# Patient Record
Sex: Male | Born: 1960 | Race: White | Hispanic: No | Marital: Single | Smoking: Former smoker
Health system: Southern US, Community
[De-identification: ages and names within clinical notes are randomized; demographics above are authoritative.]

## PROBLEM LIST (undated history)

## (undated) DIAGNOSIS — G1221 Amyotrophic lateral sclerosis: Secondary | ICD-10-CM

## (undated) DIAGNOSIS — K219 Gastro-esophageal reflux disease without esophagitis: Secondary | ICD-10-CM

## (undated) DIAGNOSIS — J302 Other seasonal allergic rhinitis: Secondary | ICD-10-CM

## (undated) DIAGNOSIS — K200 Eosinophilic esophagitis: Secondary | ICD-10-CM

## (undated) DIAGNOSIS — S42009A Fracture of unspecified part of unspecified clavicle, initial encounter for closed fracture: Secondary | ICD-10-CM

## (undated) HISTORY — DX: Gastro-esophageal reflux disease without esophagitis: K21.9

## (undated) HISTORY — DX: Eosinophilic esophagitis: K20.0

## (undated) HISTORY — DX: Fracture of unspecified part of unspecified clavicle, initial encounter for closed fracture: S42.009A

---

## 2006-11-27 DIAGNOSIS — K200 Eosinophilic esophagitis: Secondary | ICD-10-CM

## 2006-11-27 HISTORY — DX: Eosinophilic esophagitis: K20.0

## 2007-01-31 ENCOUNTER — Ambulatory Visit: Payer: Self-pay | Admitting: Internal Medicine

## 2007-01-31 ENCOUNTER — Emergency Department (HOSPITAL_COMMUNITY): Admission: EM | Admit: 2007-01-31 | Discharge: 2007-01-31 | Payer: Self-pay | Admitting: Emergency Medicine

## 2007-01-31 HISTORY — PX: ESOPHAGOGASTRODUODENOSCOPY: SHX1529

## 2007-02-14 ENCOUNTER — Ambulatory Visit (HOSPITAL_COMMUNITY): Admission: RE | Admit: 2007-02-14 | Discharge: 2007-02-14 | Payer: Self-pay | Admitting: Internal Medicine

## 2007-02-14 ENCOUNTER — Encounter (INDEPENDENT_AMBULATORY_CARE_PROVIDER_SITE_OTHER): Payer: Self-pay | Admitting: *Deleted

## 2007-02-14 ENCOUNTER — Ambulatory Visit: Payer: Self-pay | Admitting: Internal Medicine

## 2007-02-14 HISTORY — PX: ESOPHAGOGASTRODUODENOSCOPY: SHX1529

## 2007-03-27 ENCOUNTER — Ambulatory Visit: Payer: Self-pay | Admitting: Internal Medicine

## 2007-09-27 ENCOUNTER — Ambulatory Visit: Payer: Self-pay | Admitting: Internal Medicine

## 2008-08-19 ENCOUNTER — Ambulatory Visit: Payer: Self-pay | Admitting: Internal Medicine

## 2010-04-14 ENCOUNTER — Encounter (INDEPENDENT_AMBULATORY_CARE_PROVIDER_SITE_OTHER): Payer: Self-pay | Admitting: *Deleted

## 2010-12-27 NOTE — Letter (Signed)
Summary: Unable to Reach, Consult Scheduled  [facility]  [address]   [place], [place] [address]   Phone: [phone]  Fax: [phone]    04/14/2010  [name] [address] [place], [place]  [address] 09/08/1961   Dear [name],   We have been unable to reach you by phone.   At the recommendation of Dr Janna Arch  we have been asked to schedule you a consult with Dr Jena Gauss for abdominal pain,IBS symptoms,dysphagia and consult for a colonoscopy.   Please call our office at [phone].     Thank you,    Diana Eves  [facility] R. Roetta Sessions, M.D.    Jonette Eva, M.D. Lorenza Burton, FNP-BC    Tana Coast, PA-C Phone: [phone]    Fax: [phone]

## 2011-04-11 NOTE — Assessment & Plan Note (Signed)
[name], [name]                  CHART#:  [id number]   DATE:  09/27/2007                       DOB:  10-07-1961   Follow-up eosinophilic esophagitis Schatzki's ring last seen 03/27/07.  He has done overall fairly well since having his esophagus dilated and  going on a course of oral steroid therapy (could not afford Flovent).  He is taking Prilosec 20 mg orally daily.  He is a little bit scared to  swallow from time to time when he chews his food he does fairly.  He  chews his food thoroughly, takes his time.  Has liquids on hand for  swallowing. He ends up getting along fairly well with this.  He has  chosen not to see the allergist thus far although I recommended this  approach and he has the contact information.   CURRENT MEDICATIONS:  Prilosec 20 mg orally daily.   ALLERGIES:  No known drug allergies.   PHYSICAL EXAMINATION:  GENERAL:  He looks well.  VITALS:  Weight 160.  Height 5 feet 11 inches, temperature 98, blood  pressure 116/80, pulse 84.  Detailed exam deferred.   ASSESSMENT:  The eosinophilic esophagitis/Schatzki's ring status post  dilation status post systemic corticosteroid therapy with significant  improvement.   RECOMMENDATIONS:  Continue Prilosec 20 mg orally daily.   PLAN:  See him back in one year.  Should he experience significant  recurrent dysphagia symptoms we can readdress topical steroid therapy  and/or repeat general esophageal dilation.  I have recommended the  evaluation and it is believed to be helpful given our current state of  knowledge regarding this disorder and anecdotally all 3 patients I have  had previously diagnosed with eosinophilic esophagitis have had  significant benefit from seeing the allergist.  He has contact  information and it is up to him.       Jonathon Bellows, M.D.  Electronically Signed     RMR/MEDQ  D:  09/27/2007  T:  09/27/2007  Job:  [id number]

## 2011-04-11 NOTE — Assessment & Plan Note (Signed)
NAMERYNE, MCTIGUE                  CHART#:  [id number]   DATE:  08/19/2008                       DOB:  [birth year]   CHIEF COMPLAINT:  Followup of problem with swallowing.   SUBJECTIVE:  The patient is here for a followup visit.  He was last seen  on 09/27/2007.  He has a history of eosinophilic esophagitis, Schatzki  ring.  He underwent an emergent EGD in March 2008 for food impaction.  At time of repeat EGD, he was found to have a red-appearing esophageal  mucosa with a prominent Schatzki ring.  He had a moderate-sized hiatal  hernia.  He had minimal bulbar erosions.  The esophagus was dilated with  a 54-French Maloney dilator.  Biopsies were consistent with the  eosinophilic esophagitis.  He cannot afford Flonase.  He was treated  with a course of prednisone.  At the last time he was seen, he was felt  to be doing well.  He has been encouraged to go and see Dr. Lucie Leather, an  allergist in [place], but he still has not done this.  He states he  really has not had any problems with swallowing food like he did before,  but he just does not feel like he is swallowing, in fact, 100%.  When he  eats, he feels a heaviness in his chest.  He also notices it at  nighttime when he is lying down.  When he gets up in the morning, he has  heaviness in his chest reveals congestion.  He denies any typical  heartburn symptoms.  He denies any nausea, vomiting, or abdominal pain.  Bowel movements are regular.  He takes Prilosec 20 mg daily.   MEDICATIONS:  See updated list.   ALLERGIES:  No known drug allergies.   PHYSICAL EXAMINATION:  VITAL SIGNS:  Weight 166 up 6 pounds, temp 98.2,  blood pressure 120/82, and pulse 60.  GENERAL:  He looks well.  SKIN:  Warm and dry and no jaundice.  Detailed exam deferred.   IMPRESSION:  History of eosinophilic esophagitis/Schatzki ring, status  post dilation and systemic corticosteroid therapy as above.  The patient  feels like he is having some recurrence  of early symptoms.  Given that  he is having a lot of nocturnal and early morning symptoms, he may just  simply be refluxing.  He does note that he eats very late at night and  usually eats while he is in bed.  He is quite anxious and worried about  getting to the point as it was before with food impaction.  He believes  that he may go and see the allergist now.  He would like to think about  a little bit further.  I did offer him empirical trial with prednisone  therapy to see if he responds.  If not, he may need repeat  esophagogastroduodenoscopy.   PLAN:  The patient will call us when he decides which approach he wants  to go with.  I told him that if he does see the allergist in the near  future, we would like to postpone prednisone therapy  as I am not sure it would affect their testing.  Otherwise, we will go  ahead and empirically treat him for recurrent eosinophilic esophagitis  and go from there.  Tana Coast, P.A.  Electronically Signed     R. Roetta Sessions, M.D.  Electronically Signed    LL/MEDQ  D:  08/19/2008  T:  08/20/2008  Job:  [id number]

## 2011-04-14 NOTE — Op Note (Signed)
NAMESANDRO, BURGO                 ACCOUNT NO.:  [id number]   MEDICAL RECORD NO.:  [id number]          PATIENT TYPE:  EMS   LOCATION:  ED                            FACILITY:  [facility]   PHYSICIAN:  R. Roetta Sessions, M.D. DATE OF BIRTH:  1961/09/06   DATE OF PROCEDURE:  01/31/2007  DATE OF DISCHARGE:                               OPERATIVE REPORT   PROCEDURE:  Emergency esophagogastroduodenoscopy with esophageal food  bolus removal.   INDICATIONS FOR PROCEDURE:  A 50 year old gentleman with longstanding  GERD and chronic intermittent esophageal dysphagia.  Had a steak for  supper tonight at work.  Ate one bite and felt that it lodged behind his  breast bone.  He has not been able to swallow even saliva.  Since that  time, he came to the emergency room and saw Dr. Melvyn Neth, who gave him some  Glucagon and Ativan.  That did not help.  Dr. Melvyn Neth called me, and I  came to see him.   HISTORY:  Pretty much as above.  Longstanding reflux symptoms.  Not on  any acid-suppression therapy.  Has not seen a doctor in years.  He  denies chronic illnesses, any family history of GI malignancies or prior  GI surgery.  He has a partial upper plate.   He does not use alcohol, tobacco, or NSAIDs.  No history of illicit drug  use.  EGD is now being done as an emergency.  I discussed with Mr. Gwinner  that our goal tonight is to disimpact his esophagus.  He likely has a  ring and/or stricture or other process, which would not be dilated,  given their high likelihood of food in the upper GI tract.  His  questions were answered.  He is agreeable.   PROCEDURE NOTE:  O2 saturation, blood pressure, pulses, and respirations  were monitored throughout the entire procedure.  Conscious sedation with  Versed 1 mg IV and Demerol 25 mg IV in divided doses.  Cetacaine gargled  and expectorated for topical oropharyngeal anesthesia.   INSTRUMENT:  Pentax video chip system.   FINDINGS:  Examination of the tubular  esophagus revealed a fluid column  in the distal esophagus.  It was suctioned.  There was a food bolus ball-  valving.  I approximated the scope against the food bolus and gently  pushed.  It popped into the stomach, relieving the impaction.  The  distal esophageal mucosa was quite macerated, and there was a prominent  Schatzki's ring.  Given there was food in the stomach, I elected not to  pursue further evaluation this evening.  The patient tolerated the  procedure well and was reactive.   ENDOSCOPY IMPRESSION:  1. Esophageal food impaction, as described above.  Status post      disimpaction, as described above.  2. Schatzki's ring, inflamed distal esophageal mucosa.  Complete      examination not carried out.   RECOMMENDATIONS:  1. Full-liquid diet until esophageal dilation can be performed.  2. Prevacid Solu-Tabs 30 mg once daily.  3. My office will call Mr. Mahler to set  up an elective dilation in the      next 10 days to two weeks.      Jonathon Bellows, M.D.  Electronically Signed     RMR/MEDQ  D:  01/31/2007  T:  02/01/2007  Job:  [id number]   cc:   Katheren Shams. Melvyn Neth, MD  Fax: [phone]

## 2011-04-14 NOTE — Op Note (Signed)
NAMEHILLERY, BHALLA                 ACCOUNT NO.:  [id number]   MEDICAL RECORD NO.:  [id number]          PATIENT TYPE:  AMB   LOCATION:  DAY                           FACILITY:  [facility]   PHYSICIAN:  R. Roetta Sessions, M.D. DATE OF BIRTH:  August 25, 1961   DATE OF PROCEDURE:  02/14/2007  DATE OF DISCHARGE:                               OPERATIVE REPORT   PROCEDURE:  Esophagogastroduodenoscopy with Elease Hashimoto dilation followed by  biopsy.   INDICATIONS FOR PROCEDURE:  The patient is a 50 year old gentleman whom  I first saw emergently on 01/31/07 when he presented to the Emergency  Department with a food impaction.  He underwent emergency disimpaction.  He was felt to have a Schatzki's ring at that time.  He has been on  Prilosec 20 mg OTC ever since.  He comes now for elective EGD with  esophageal dilation.  This procedure has been discussed with the patient  at length.  Potential risks, benefits and alternatives have been  reviewed.  Questions are answered.  Please see documentation in medical  records.   PROCEDURE NOTE:  O2 saturation, blood pressure, pulse and respirations  were monitored throughout the entire procedure.  Conscious sedation with  IV Versed and Demerol in incremental doses. Cetacaine spray for  hypopharyngeal anesthesia.   INSTRUMENT:  Pentax video chip system.   FINDINGS:  Examination of the tubular esophagus revealed a prominent  Schatzki's ring also.  There was a ribbed or somewhat trachealized  appearance to the esophageal mucosa.  Please see photos.  Somewhat  suspicious for eosinophilic esophagitis.  The esophageal lumen however  was widely patent except at the level of the ring.  The EG junction was  easily traversed.  The stomach, colon and gastric cavity were emptied,  insufflated well with air.  A thorough examination of the gastric mucosa  including retroflexion in the proximal stomach, esophagus, gastric  junction demonstrated only a small hiatal hernia.  The  pylorus was  patent and easily traversed.  Examination of the bulb and second portion  revealed no abnormalities.   THERAPY/DIAGNOSTIC MANEUVERS PERFORMED:  The scope was withdrawn.  I  felt at least a 54 [place] Maloney dilator was needed to disrupt the  ring.  This was indeed passed fully with ease.  I looked back.  It  revealed the ring had been disrupted and there were two 2 cm mucosal  tears and the distal esophagus appeared to be superficial.  This  relatively modest size dilator for the size of this patient's esophagus  produced a somewhat surprising disruption in the mucosa which heightens  m suspicious for eosinophilic esophagitis.  Subsequently, biopsies of  the mid esophagus were taken for histologic study.  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  One of those were biopsied separately.  The patient  tolerated the procedure well and reactive after endoscopy.   IMPRESSION:  Somewhat ribbed appearing esophageal mucosa, prominent  Schatzki's ring.  Pale raised esophageal nodule, status post dilation of  Schatzki's ring with [facility] dilators  status post biopsy esophageal  mucosa to rule out eosinophilic esophagitis.  Biopsy of pale raised  nodule done separately.  Moderate sized hiatal hernia, otherwise normal  stomach.  Patent pylorus.  Not mentioned above, the patient was felt to  have minimal bulbar erosions, otherwise D1, D2 appeared normal.   RECOMMENDATIONS:  1. Continue Prilosec 20 mg orally daily.  2. Add Carafate one gram slurry q.i.d. to this regimen x3 days.  3. Clear liquid diet, advance to a soft diet and then as tolerated,      beginning 02/15/07.  4. Further recommendations to follow pending review of path.      Jonathon Bellows, M.D.  Electronically Signed     RMR/MEDQ  D:  02/14/2007  T:  02/14/2007  Job:  [id number]   cc:   Ulis Rias  Fax: [phone]

## 2013-04-09 ENCOUNTER — Encounter: Payer: Self-pay | Admitting: Internal Medicine

## 2013-04-10 ENCOUNTER — Ambulatory Visit (INDEPENDENT_AMBULATORY_CARE_PROVIDER_SITE_OTHER): Payer: BC Managed Care – PPO | Admitting: Gastroenterology

## 2013-04-10 ENCOUNTER — Encounter: Payer: Self-pay | Admitting: Gastroenterology

## 2013-04-10 VITALS — BP 130/87 | HR 56 | Temp 97.4°F | Ht 68.0 in | Wt 186.0 lb

## 2013-04-10 DIAGNOSIS — R1310 Dysphagia, unspecified: Secondary | ICD-10-CM | POA: Insufficient documentation

## 2013-04-10 DIAGNOSIS — K219 Gastro-esophageal reflux disease without esophagitis: Secondary | ICD-10-CM

## 2013-04-10 DIAGNOSIS — R1314 Dysphagia, pharyngoesophageal phase: Secondary | ICD-10-CM

## 2013-04-10 DIAGNOSIS — K200 Eosinophilic esophagitis: Secondary | ICD-10-CM

## 2013-04-10 NOTE — Patient Instructions (Addendum)
1. We have scheduled you for an upper endoscopy with dilation of your esophagus by Dr. Jena Gauss. Please see separate instructions. 2. Please continue Prilosec daily.

## 2013-04-10 NOTE — Assessment & Plan Note (Signed)
52 year old gentleman with recurrent solid food esophageal dilation with history of food impaction in 2008. He had esophageal dilation on a subsequent EGD at that time. Biopsies confirmed eosinophilic esophagitis which was treated with systemic corticosteroids. Patient failed to follow up for food allergy testing. Suspect flare of eosinophilic esophagitis with stricture. Recommend EGD with dilation in the near future.  I have discussed the risks, alternatives, benefits with regards to but not limited to the risk of reaction to medication, bleeding, infection, perforation and the patient is agreeable to proceed. Written consent to be obtained.  Continue omeprazole 20mg  daily. Soft mechanical diet at this time.  As an aside, vague abdominal pain unrelated to meals/BMs. EGD as planned. Offered colonoscopy, but patient not interested at this time.

## 2013-04-10 NOTE — Progress Notes (Signed)
Cc PCP 

## 2013-04-10 NOTE — Progress Notes (Signed)
Primary Care Physician:  DONDIEGO,RICHARD M, MD  Primary Gastroenterologist:  Michael Rourk, MD   Chief Complaint  Patient presents with  . Dysphagia    HPI:  [name] is a 52 y.o. male here for complaints of recurrent dysphagia.  He has a history of eosinophilic esophagitis and 2008. At that time he was treated with systemic corticosteroid therapy as he could not afford Flovent at that time. We referred him to an allergist but he did not keep the appointment. When he initially presented he had a food impaction and had come back for later dilation. See below for details. Last saw him in 2009. There the course of last several months he has noted recurrent issue swallowing. Sometimes he does okay however. He has had several episodes where he feels the food is slow going down but no food impaction at this point. Denies typical heartburn. He has been on omeprazole 20 mg daily. Steak and meats difficulty to swallow. Upper abdominal discomfort, off and on for about one year ago. Wakes up at times with vague pain. Nothing excruciating. Pain is unrelated to meals or bowel movement. No prior colonoscopy. No melena, brbpr.    Current Outpatient Prescriptions  Medication Sig Dispense Refill  . Omeprazole Magnesium (PRILOSEC OTC PO) Take 20 mg by mouth daily.      . sodium chloride (OCEAN) 0.65 % nasal spray Place 1 spray into the nose as needed for congestion.       No current facility-administered medications for this visit.    Allergies as of 04/10/2013  . (No Known Allergies)    Past Medical History  Diagnosis Date  . Eosinophilic esophagitis 2008    Previously treated with systemic corticosteroid therapy as the patient could not afford Flovent at that time. Patient was referred to the allergist but did not go to the appointment.  . GERD (gastroesophageal reflux disease)     Past Surgical History  Procedure Laterality Date  . Esophagogastroduodenoscopy  01/31/2007    RMR:Esophageal  food impaction/ Schatzki's ring, inflamed distal esophageal mucosa  . Esophagogastroduodenoscopy  02/14/2007    RMR: Somewhat ribbed appearing esophageal mucosa (biopsy-eosinophilic esophagitis), prominent Schatzki ring status post dilation with Maloney dilators.  Pale raised esophageal nodule status post biopsy (eosinophilic esophagitis). Moderate sized hiatal hernia.     Family History  Problem Relation Age of Onset  . Colon cancer Neg Hx   . Liver disease Neg Hx     History   Social History  . Marital Status: Single    Spouse Name: N/A    Number of Children: N/A  . Years of Education: N/A   Occupational History  . Not on file.   Social History Main Topics  . Smoking status: Never Smoker   . Smokeless tobacco: Not on file  . Alcohol Use: No  . Drug Use: No  . Sexually Active: Not on file   Other Topics Concern  . Not on file   Social History Narrative  . No narrative on file      ROS:  General: Negative for anorexia, weight loss, fever, chills, fatigue, weakness. Eyes: Negative for vision changes.  ENT: Negative for hoarseness, difficulty swallowing , nasal congestion. Complains of rhinorrhea CV: Negative for chest pain, angina, palpitations, dyspnea on exertion, peripheral edema.  Respiratory: Negative for dyspnea at rest, dyspnea on exertion, cough, sputum, wheezing.  GI: See history of present illness. GU:  Negative for dysuria, hematuria, urinary incontinence, urinary frequency, nocturnal urination.  MS: Negative   for joint pain, low back pain.  Derm: Negative for rash or itching.  Neuro: Negative for weakness, abnormal sensation, seizure, frequent headaches, memory loss, confusion.  Psych: Negative for anxiety, depression, suicidal ideation, hallucinations.  Endo: Negative for unusual weight change.  Heme: Negative for bruising or bleeding. Allergy: Negative for rash or hives.    Physical Examination:  BP 130/87  Pulse 56  Temp(Src) 97.4 F (36.3 C)  (Oral)  Ht 5' 8" (1.727 m)  Wt 186 lb (84.369 kg)  BMI 28.29 kg/m2   General: Well-nourished, well-developed in no acute distress. Anxious Head: Normocephalic, atraumatic.   Eyes: Conjunctiva pink, no icterus. Mouth: Oropharyngeal mucosa moist and pink , no lesions erythema or exudate. Neck: Supple without thyromegaly, masses, or lymphadenopathy.  Lungs: Clear to auscultation bilaterally.  Heart: Regular rate and rhythm, no murmurs rubs or gallops.  Abdomen: Bowel sounds are normal, nontender, nondistended, no hepatosplenomegaly or masses, no abdominal bruits or    hernia , no rebound or guarding.   Rectal: Not performed Extremities: No lower extremity edema. No clubbing or deformities.  Neuro: Alert and oriented x 4 , grossly normal neurologically.  Skin: Warm and dry, no rash or jaundice.   Psych: Alert and cooperative, normal mood and affect.   

## 2013-04-25 ENCOUNTER — Encounter (HOSPITAL_COMMUNITY)
Admission: RE | Disposition: A | Discharge status: Home / Self Care | Payer: Self-pay | Source: Ambulatory Visit | Attending: Internal Medicine

## 2013-04-25 ENCOUNTER — Encounter (HOSPITAL_COMMUNITY): Payer: Self-pay | Admitting: *Deleted

## 2013-04-25 ENCOUNTER — Ambulatory Visit (HOSPITAL_COMMUNITY)
Admission: RE | Admit: 2013-04-25 | Discharge: 2013-04-25 | Disposition: A | Discharge status: Home / Self Care | Payer: BC Managed Care – PPO | Source: Ambulatory Visit | Attending: Internal Medicine | Admitting: Internal Medicine

## 2013-04-25 DIAGNOSIS — K2960 Other gastritis without bleeding: Secondary | ICD-10-CM

## 2013-04-25 DIAGNOSIS — K219 Gastro-esophageal reflux disease without esophagitis: Secondary | ICD-10-CM

## 2013-04-25 DIAGNOSIS — K200 Eosinophilic esophagitis: Secondary | ICD-10-CM | POA: Insufficient documentation

## 2013-04-25 DIAGNOSIS — K210 Gastro-esophageal reflux disease with esophagitis: Secondary | ICD-10-CM

## 2013-04-25 DIAGNOSIS — R1310 Dysphagia, unspecified: Secondary | ICD-10-CM | POA: Insufficient documentation

## 2013-04-25 HISTORY — PX: ESOPHAGOGASTRODUODENOSCOPY (EGD) WITH ESOPHAGEAL DILATION: SHX5812

## 2013-04-25 HISTORY — DX: Other seasonal allergic rhinitis: J30.2

## 2013-04-25 SURGERY — ESOPHAGOGASTRODUODENOSCOPY (EGD) WITH ESOPHAGEAL DILATION
Anesthesia: Moderate Sedation

## 2013-04-25 MED ORDER — MIDAZOLAM HCL 5 MG/5ML IJ SOLN
INTRAMUSCULAR | Status: AC
  Filled 2013-04-25: qty 10

## 2013-04-25 MED ORDER — ONDANSETRON HCL 4 MG/2ML IJ SOLN
INTRAMUSCULAR | Status: DC | PRN
  Administered 2013-04-25: 4 mg via INTRAVENOUS

## 2013-04-25 MED ORDER — MIDAZOLAM HCL 5 MG/5ML IJ SOLN
INTRAMUSCULAR | Status: DC | PRN
  Administered 2013-04-25: 1 mg via INTRAVENOUS
  Administered 2013-04-25 (×2): 2 mg via INTRAVENOUS
  Administered 2013-04-25: 1 mg via INTRAVENOUS

## 2013-04-25 MED ORDER — MEPERIDINE HCL 100 MG/ML IJ SOLN
INTRAMUSCULAR | Status: DC | PRN
  Administered 2013-04-25: 25 mg via INTRAVENOUS
  Administered 2013-04-25 (×2): 50 mg via INTRAVENOUS

## 2013-04-25 MED ORDER — BUTAMBEN-TETRACAINE-BENZOCAINE 2-2-14 % EX AERO
INHALATION_SPRAY | CUTANEOUS | Status: DC | PRN
  Administered 2013-04-25: 2 via TOPICAL

## 2013-04-25 MED ORDER — ONDANSETRON HCL 4 MG/2ML IJ SOLN
INTRAMUSCULAR | Status: AC
  Filled 2013-04-25: qty 2

## 2013-04-25 MED ORDER — STERILE WATER FOR IRRIGATION IR SOLN
Status: DC | PRN
  Administered 2013-04-25: 08:00:00

## 2013-04-25 MED ORDER — SODIUM CHLORIDE 0.9 % IV SOLN
INTRAVENOUS | Status: DC
  Administered 2013-04-25: 1000 mL via INTRAVENOUS

## 2013-04-25 MED ORDER — MEPERIDINE HCL 100 MG/ML IJ SOLN
INTRAMUSCULAR | Status: AC
  Filled 2013-04-25: qty 2

## 2013-04-25 NOTE — H&P (View-Only) (Signed)
Cc PCP 

## 2013-04-25 NOTE — Op Note (Signed)
[facility] [address] [place], [address]   ENDOSCOPY PROCEDURE REPORT  PATIENT: [name], [name]  MR#: [id number] BIRTHDATE: 1961/01/25 , 52  yrs. old GENDER: Male ENDOSCOPIST: R.  Roetta Sessions, MD FACP FACG REFERRED BY:  Oval Linsey, M.D. PROCEDURE DATE:  04/25/2013 PROCEDURE:     EGD with Elease Hashimoto dilation followed by esophageal biopsy  INDICATIONS:     Recurrent esophageal dysphagia; history of GERD; history of eosinophilic esophagitis  INFORMED CONSENT:   The risks, benefits, limitations, alternatives and imponderables have been discussed.  The potential for biopsy, esophogeal dilation, etc. have also been reviewed.  Questions have been answered.  All parties agreeable.  Please see the history and physical in the medical record for more information.  MEDICATIONS: Versed 6 mg IV and Demerol 125 mg IV in divided doses. Zofran 4 mg IV. Cetacaine spray.  DESCRIPTION OF PROCEDURE:   The EG-[id number] ([id number])  endoscope was introduced through the mouth and advanced to the second portion of the duodenum without difficulty or limitations.  The mucosal surfaces were surveyed very carefully during advancement of the scope and upon withdrawal.  Retroflexion view of the proximal stomach and esophagogastric junction was performed.      FINDINGS:   Patent appearing tubular esophagus patient had a 8 mm distal esophageal erosion Spratley the EG junction. No Barrett's esophagus. Patient did have a subtle "ringed" esophageal body. Stomach empty. Patient couple of antral erosions. Small hiatal hernia. Patent pylorus. Normal first and second portion of the duodenum  THERAPEUTIC / DIAGNOSTIC MANEUVERS PERFORMED:  A 52 French Maloney dilators passed to full insertion easily. A look back revealed a 5 mm superficial tear through the UES. Just above the GE junction is a 1.5 cm superficial appearing tear. Subsequently, biopsies of the mid and distal esophagus were  taken for histologic study.   COMPLICATIONS:  None  IMPRESSION:    Erosive reflux esophagitis. Subtle changes suggestive of eosinophilic esophagitis. Status post conservative Maloney dilation followed by esophageal biopsy as described above. Hiatal hernia. Antral erosions of doubtful clinical significance  RECOMMENDATIONS:    Swallowing precautions recommended. Increase omeprazole to 20 mg orally twice daily. Will make further recommendations regarding management once pathology report is available for review.    _______________________________ R. Roetta Sessions, MD FACP [facility] eSigned:  R. Roetta Sessions, MD FACP [facility] 04/25/2013 8:12 AM     CC:  PATIENT NAME:  Andreas, Sobolewski MR#: [id number]

## 2013-04-25 NOTE — H&P (View-Only) (Signed)
Primary Care Physician:  Isabella Stalling, MD  Primary Gastroenterologist:  Roetta Sessions, MD   Chief Complaint  Patient presents with  . Dysphagia    HPI:  [name] is a 52 y.o. male here for complaints of recurrent dysphagia.  He has a history of eosinophilic esophagitis and 2008. At that time he was treated with systemic corticosteroid therapy as he could not afford Flovent at that time. We referred him to an allergist but he did not keep the appointment. When he initially presented he had a food impaction and had come back for later dilation. See below for details. Last saw him in 2009. There the course of last several months he has noted recurrent issue swallowing. Sometimes he does okay however. He has had several episodes where he feels the food is slow going down but no food impaction at this point. Denies typical heartburn. He has been on omeprazole 20 mg daily. Steak and meats difficulty to swallow. Upper abdominal discomfort, off and on for about one year ago. Wakes up at times with vague pain. Nothing excruciating. Pain is unrelated to meals or bowel movement. No prior colonoscopy. No melena, brbpr.    Current Outpatient Prescriptions  Medication Sig Dispense Refill  . Omeprazole Magnesium (PRILOSEC OTC PO) Take 20 mg by mouth daily.      . sodium chloride (OCEAN) 0.65 % nasal spray Place 1 spray into the nose as needed for congestion.       No current facility-administered medications for this visit.    Allergies as of 04/10/2013  . (No Known Allergies)    Past Medical History  Diagnosis Date  . Eosinophilic esophagitis 2008    Previously treated with systemic corticosteroid therapy as the patient could not afford Flovent at that time. Patient was referred to the allergist but did not go to the appointment.  Marland Kitchen GERD (gastroesophageal reflux disease)     Past Surgical History  Procedure Laterality Date  . Esophagogastroduodenoscopy  01/31/2007    [id number]  food impaction/ Schatzki's ring, inflamed distal esophageal mucosa  . Esophagogastroduodenoscopy  02/14/2007    RMR: Somewhat ribbed appearing esophageal mucosa (biopsy-eosinophilic esophagitis), prominent Schatzki ring status post dilation with Maloney dilators.  Pale raised esophageal nodule status post biopsy (eosinophilic esophagitis). Moderate sized hiatal hernia.     Family History  Problem Relation Age of Onset  . Colon cancer Neg Hx   . Liver disease Neg Hx     History   Social History  . Marital Status: Single    Spouse Name: N/A    Number of Children: N/A  . Years of Education: N/A   Occupational History  . Not on file.   Social History Main Topics  . Smoking status: Never Smoker   . Smokeless tobacco: Not on file  . Alcohol Use: No  . Drug Use: No  . Sexually Active: Not on file   Other Topics Concern  . Not on file   Social History Narrative  . No narrative on file      ROS:  General: Negative for anorexia, weight loss, fever, chills, fatigue, weakness. Eyes: Negative for vision changes.  ENT: Negative for hoarseness, difficulty swallowing , nasal congestion. Complains of rhinorrhea CV: Negative for chest pain, angina, palpitations, dyspnea on exertion, peripheral edema.  Respiratory: Negative for dyspnea at rest, dyspnea on exertion, cough, sputum, wheezing.  GI: See history of present illness. GU:  Negative for dysuria, hematuria, urinary incontinence, urinary frequency, nocturnal urination.  MS: Negative  for joint pain, low back pain.  Derm: Negative for rash or itching.  Neuro: Negative for weakness, abnormal sensation, seizure, frequent headaches, memory loss, confusion.  Psych: Negative for anxiety, depression, suicidal ideation, hallucinations.  Endo: Negative for unusual weight change.  Heme: Negative for bruising or bleeding. Allergy: Negative for rash or hives.    Physical Examination:  BP 130/87  Pulse 56  Temp(Src) 97.4 F (36.3 C)  (Oral)  Ht 5\' 8"  (1.727 m)  Wt 186 lb (84.369 kg)  BMI 28.29 kg/m2   General: Well-nourished, well-developed in no acute distress. Anxious Head: Normocephalic, atraumatic.   Eyes: Conjunctiva pink, no icterus. Mouth: Oropharyngeal mucosa moist and pink , no lesions erythema or exudate. Neck: Supple without thyromegaly, masses, or lymphadenopathy.  Lungs: Clear to auscultation bilaterally.  Heart: Regular rate and rhythm, no murmurs rubs or gallops.  Abdomen: Bowel sounds are normal, nontender, nondistended, no hepatosplenomegaly or masses, no abdominal bruits or    hernia , no rebound or guarding.   Rectal: Not performed Extremities: No lower extremity edema. No clubbing or deformities.  Neuro: Alert and oriented x 4 , grossly normal neurologically.  Skin: Warm and dry, no rash or jaundice.   Psych: Alert and cooperative, normal mood and affect.

## 2013-04-25 NOTE — Interval H&P Note (Signed)
History and Physical Interval Note:  04/25/2013 7:34 AM  [name]  has presented today for surgery, with the diagnosis of Eosinophilic esophagitis, GERD, Esophageal Dysphagia  The various methods of treatment have been discussed with the patient and family. After consideration of risks, benefits and other options for treatment, the patient has consented to  Procedure(s) with comments: ESOPHAGOGASTRODUODENOSCOPY (EGD) WITH ESOPHAGEAL DILATION (N/A) - 7:30 as a surgical intervention .  The patient's history has been reviewed, patient examined, no change in status, stable for surgery.  I have reviewed the patient's chart and labs.  Questions were answered to the patient's satisfaction.     [name]  EGD with potential esophageal dilation as planned.The risks, benefits, limitations, alternatives and imponderables have been reviewed with the patient. Potential for esophageal dilation, biopsy, etc. have also been reviewed.  Questions have been answered. All parties agreeable.

## 2013-04-25 NOTE — Interval H&P Note (Signed)
Patient without change. EGD with possible soft with dilation per plan.   History and Physical Interval Note:  04/25/2013 7:41 AM  [name]  has presented today for surgery, with the diagnosis of Eosinophilic esophagitis, GERD, Esophageal Dysphagia  The various methods of treatment have been discussed with the patient and family. After consideration of risks, benefits and other options for treatment, the patient has consented to  Procedure(s) with comments: ESOPHAGOGASTRODUODENOSCOPY (EGD) WITH ESOPHAGEAL DILATION (N/A) - 7:30 as a surgical intervention .  The patient's history has been reviewed, patient examined, no change in status, stable for surgery.  I have reviewed the patient's chart and labs.  Questions were answered to the patient's satisfaction.     [name]

## 2013-04-28 ENCOUNTER — Encounter (HOSPITAL_COMMUNITY): Payer: Self-pay | Admitting: Internal Medicine

## 2013-04-29 ENCOUNTER — Encounter: Payer: Self-pay | Admitting: Internal Medicine

## 2013-05-02 ENCOUNTER — Encounter: Payer: Self-pay | Admitting: Internal Medicine

## 2013-05-22 ENCOUNTER — Encounter: Payer: Self-pay | Admitting: Gastroenterology

## 2013-05-22 ENCOUNTER — Ambulatory Visit (INDEPENDENT_AMBULATORY_CARE_PROVIDER_SITE_OTHER): Payer: BC Managed Care – PPO | Admitting: Gastroenterology

## 2013-05-22 VITALS — BP 127/75 | HR 63 | Temp 98.2°F | Ht 72.0 in | Wt 184.0 lb

## 2013-05-22 DIAGNOSIS — K200 Eosinophilic esophagitis: Secondary | ICD-10-CM

## 2013-05-22 DIAGNOSIS — R1314 Dysphagia, pharyngoesophageal phase: Secondary | ICD-10-CM

## 2013-05-22 DIAGNOSIS — R1310 Dysphagia, unspecified: Secondary | ICD-10-CM

## 2013-05-22 MED ORDER — FLUTICASONE PROPIONATE HFA 220 MCG/ACT IN AERO: INHALATION_SPRAY | RESPIRATORY_TRACT | Status: DC

## 2013-05-22 NOTE — Patient Instructions (Addendum)
1. Flovent, 2 sprays in mouth (swallow, do not inhale) twice a day for 8 weeks. Do not eat or drink for 30 minutes after taking medication. 2. We will check into allergy testing for you. 3. Continue Prilosec daily as before. 4. After taking Flovent, if you have recurrent swallowing difficulties or your symptoms do not continue to improve, please let us know as you may need a higher dose or longer duration of therapy.  I have tried numerous of times to get Mr. Commons referred to [facility] in [place] for Allergy Testing and he will not return my calls.

## 2013-05-22 NOTE — Progress Notes (Signed)
Primary Care Physician: Isabella Stalling, MD  Primary Gastroenterologist:  Roetta Sessions, MD   Chief Complaint  Patient presents with  . Follow-up    HPI: [name] is a 52 y.o. male here for f/u of EGD with dilation, eosinophilic esophagitis on 04/25/13. Some improvement in swallowing but still some minor issues with solid foods at times. No heartburn. Denies abdominal pain, N/V, constipation, diarrhea, melena, brbpr. No prior colonoscopy.   Current Outpatient Prescriptions  Medication Sig Dispense Refill  . omeprazole (PRILOSEC) 20 MG capsule Take 20 mg by mouth daily.       No current facility-administered medications for this visit.    Allergies as of 05/22/2013  . (No Known Allergies)    ROS:  General: Negative for anorexia, weight loss, fever, chills, fatigue, weakness. ENT: Negative for hoarseness,   nasal congestion. CV: Negative for chest pain, angina, palpitations, dyspnea on exertion, peripheral edema.  Respiratory: Negative for dyspnea at rest, dyspnea on exertion, cough, sputum, wheezing.  GI: See history of present illness. GU:  Negative for dysuria, hematuria, urinary incontinence, urinary frequency, nocturnal urination.  Endo: Negative for unusual weight change.    Physical Examination:   BP 127/75  Pulse 63  Temp(Src) 98.2 F (36.8 C) (Oral)  Ht 6' (1.829 m)  Wt 184 lb (83.462 kg)  BMI 24.95 kg/m2  General: Well-nourished, well-developed in no acute distress.  Eyes: No icterus. Mouth: Oropharyngeal mucosa moist and pink , no lesions erythema or exudate. Lungs: Clear to auscultation bilaterally.  Heart: Regular rate and rhythm, no murmurs rubs or gallops.  Abdomen: Bowel sounds are normal, nontender, nondistended, no hepatosplenomegaly or masses, no abdominal bruits or hernia , no rebound or guarding.   Extremities: No lower extremity edema. No clubbing or deformities. Neuro: Alert and oriented x 4   Skin: Warm and dry, no jaundice.   Psych:  Alert and cooperative, normal mood and affect.

## 2013-05-24 NOTE — Assessment & Plan Note (Addendum)
52 y/o with eosinophilic esophagitis, recurrent dysphagia. Some improvement s/p dilation. Still with some difficulties with certain foods at times. He is very anxious about his condition. Notes he has been able to eat out with friends and has not had any episodes of impaction.  Notes he is very careful and doesn't feel 100%  1. Flovent. 2. Encouraged allergy testing. Patient has agreed if it can be arranged in [place]. 3. Consider colonoscopy when patient is ready. 4. Office visit in 3 months with Dr. Jena Gauss.

## 2013-05-26 NOTE — Progress Notes (Signed)
Cc PCP 

## 2013-11-28 NOTE — Progress Notes (Signed)
REVIEWED.  

## 2015-07-23 ENCOUNTER — Emergency Department (HOSPITAL_COMMUNITY): Payer: 59

## 2015-07-23 ENCOUNTER — Encounter (HOSPITAL_COMMUNITY): Payer: Self-pay | Admitting: Emergency Medicine

## 2015-07-23 ENCOUNTER — Emergency Department (HOSPITAL_COMMUNITY)
Admission: EM | Admit: 2015-07-23 | Discharge: 2015-07-24 | Disposition: A | Discharge status: Home / Self Care | Payer: 59 | Attending: Emergency Medicine | Admitting: Emergency Medicine

## 2015-07-23 DIAGNOSIS — T1490XA Injury, unspecified, initial encounter: Secondary | ICD-10-CM

## 2015-07-23 DIAGNOSIS — Y998 Other external cause status: Secondary | ICD-10-CM | POA: Diagnosis not present

## 2015-07-23 DIAGNOSIS — W11XXXA Fall on and from ladder, initial encounter: Secondary | ICD-10-CM | POA: Diagnosis not present

## 2015-07-23 DIAGNOSIS — Y9389 Activity, other specified: Secondary | ICD-10-CM | POA: Insufficient documentation

## 2015-07-23 DIAGNOSIS — S42011A Anterior displaced fracture of sternal end of right clavicle, initial encounter for closed fracture: Secondary | ICD-10-CM

## 2015-07-23 DIAGNOSIS — K219 Gastro-esophageal reflux disease without esophagitis: Secondary | ICD-10-CM | POA: Diagnosis not present

## 2015-07-23 DIAGNOSIS — Y9289 Other specified places as the place of occurrence of the external cause: Secondary | ICD-10-CM | POA: Insufficient documentation

## 2015-07-23 DIAGNOSIS — S42014A Posterior displaced fracture of sternal end of right clavicle, initial encounter for closed fracture: Secondary | ICD-10-CM | POA: Insufficient documentation

## 2015-07-23 DIAGNOSIS — Z79899 Other long term (current) drug therapy: Secondary | ICD-10-CM | POA: Insufficient documentation

## 2015-07-23 DIAGNOSIS — S4991XA Unspecified injury of right shoulder and upper arm, initial encounter: Secondary | ICD-10-CM | POA: Diagnosis present

## 2015-07-23 DIAGNOSIS — S42009A Fracture of unspecified part of unspecified clavicle, initial encounter for closed fracture: Secondary | ICD-10-CM

## 2015-07-23 IMAGING — DX DG CHEST 1V
1 series · 1 of 1 positions shown · non-contrast
Comparison: None

CLINICAL DATA: RIGHT-sided chest pain and swelling, fell through
the hole of a loft, grabbed both sides as he fell, shoulder went the
wrong way, unable to raise arms

EXAM:
CHEST  1 VIEW

[chest pa]
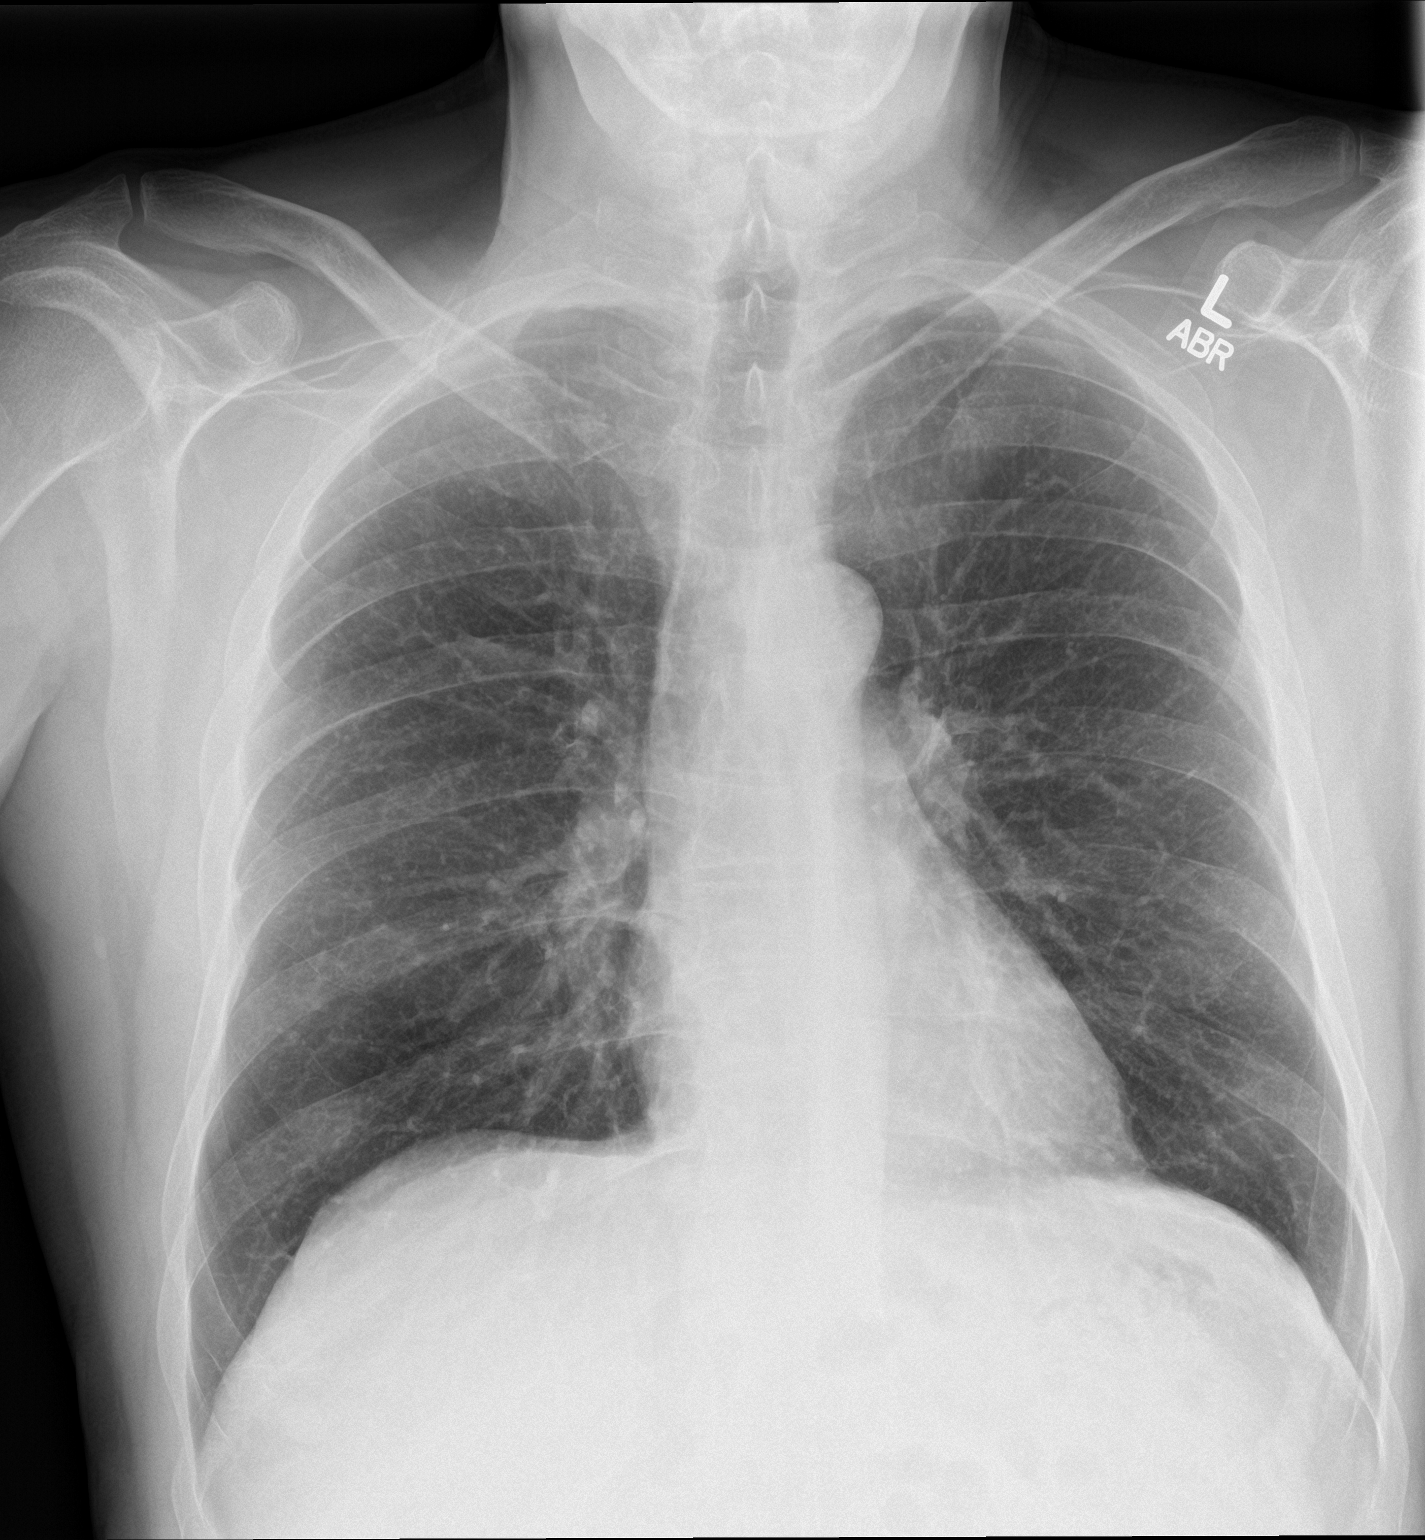

[1 of 1 positions shown; findings below may reference images not displayed]

FINDINGS: Patient unable to raise arms to perform lateral view.

Normal heart size, mediastinal contours, and pulmonary vascularity.

Lungs clear.

No pleural effusion or pneumothorax.

Fracture medial RIGHT clavicle with caudal displacement of the
distal clavicle [DATE] shaft width.

No additional fractures identified.
IMPRESSION: Displaced fracture medial RIGHT clavicle.

## 2015-07-23 NOTE — ED Notes (Signed)
Patient states he was coming out of a loft and went to get through the hole and fell. States when he fell, he grabbed both sides of the hole and "my shoulders just went the wrong way." Patient has swelling noted to right upper chest area. States "it sounded like a crunching thing."

## 2015-07-24 ENCOUNTER — Emergency Department (HOSPITAL_COMMUNITY): Payer: 59

## 2015-07-24 IMAGING — DX DG CLAVICLE*R*
2 series · 2 of 2 positions shown · non-contrast
Comparison: Chest [DATE]

CLINICAL DATA: Medial right clavicular fracture after fall.

EXAM:
RIGHT CLAVICLE - 2+ VIEWS

[clavicle ap]
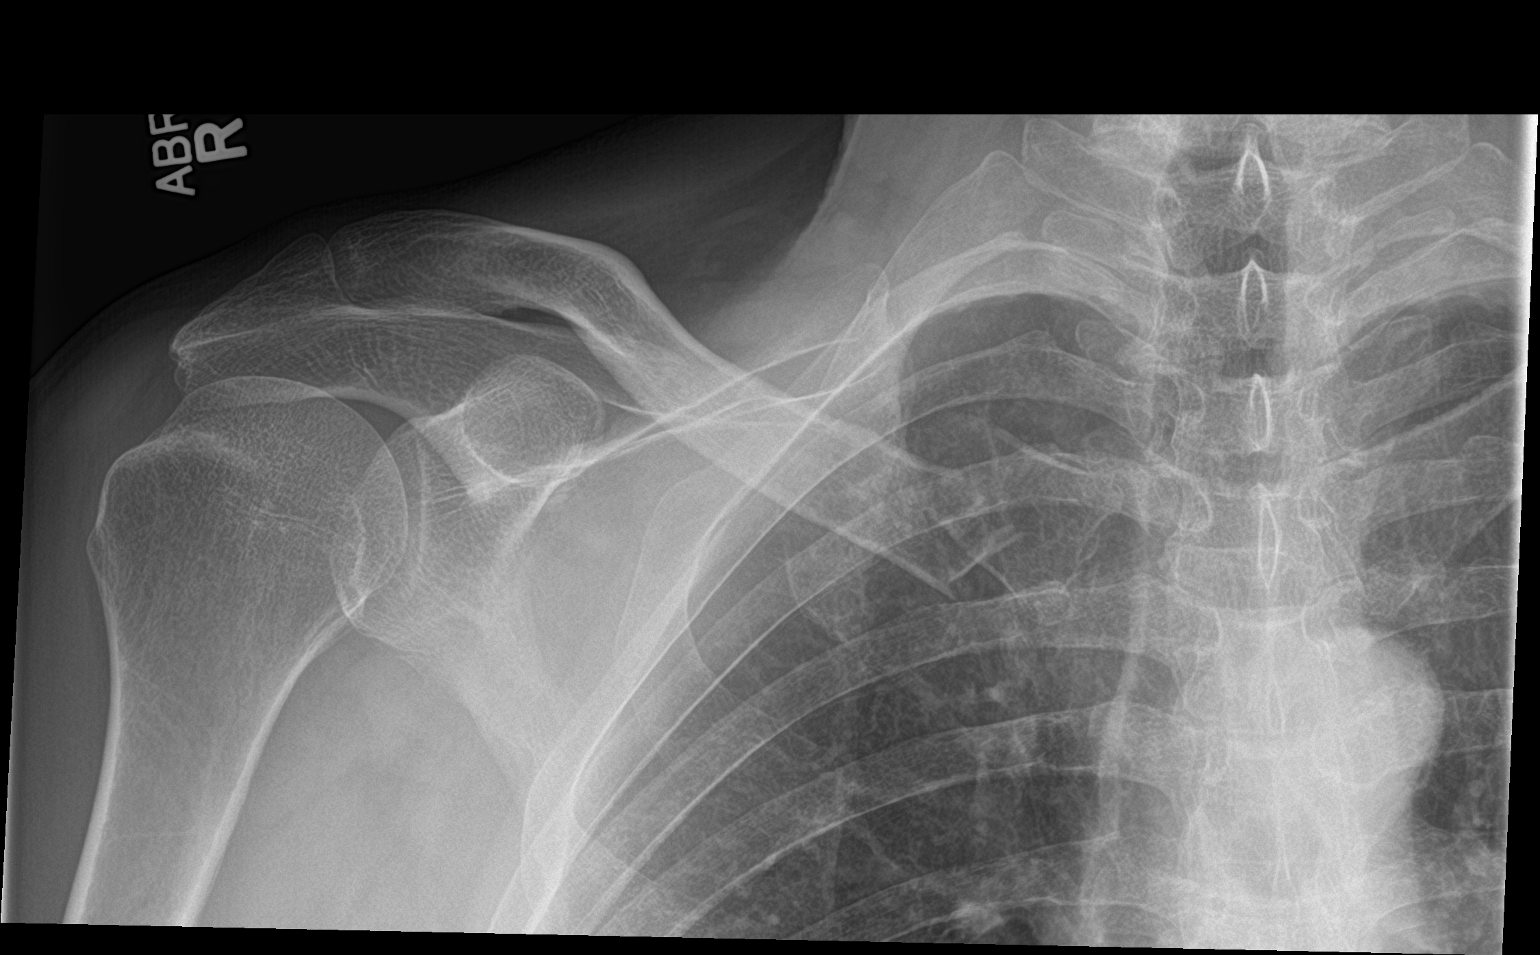

[clavicle axial]
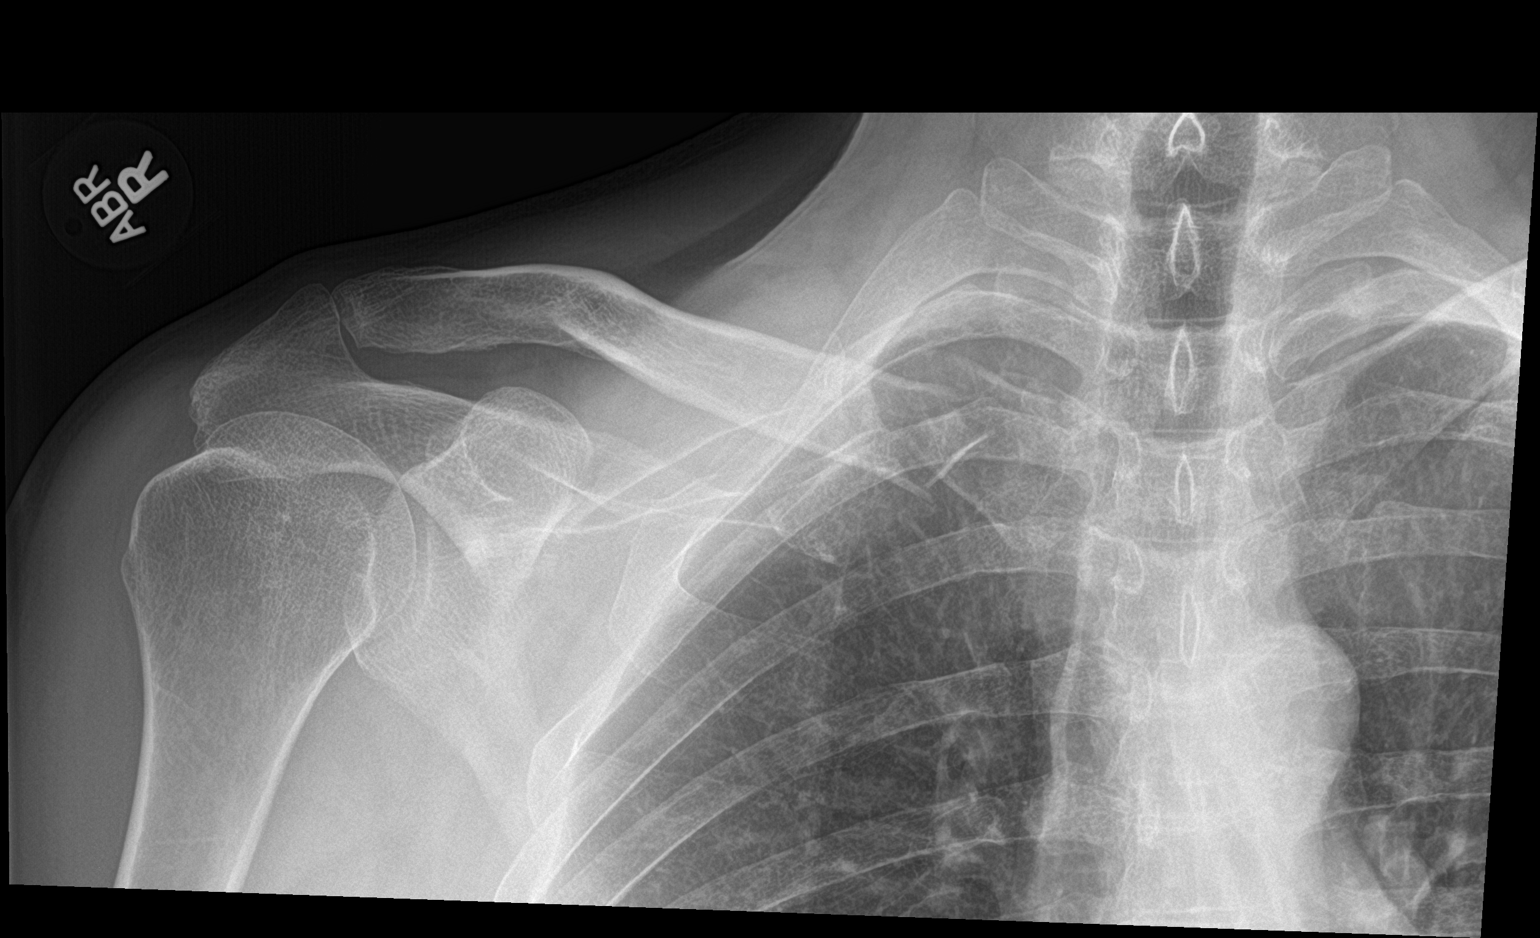

[2 of 2 positions shown; findings below may reference images not displayed]

FINDINGS: Comminuted fractures of the medial aspect of the right clavicle with
inferior half width displacement of the distal fracture fragment.
Distal clavicle appears intact. Coracoclavicular and
acromioclavicular spaces are maintained.
IMPRESSION: Comminuted fractures of the medial right clavicle with half width
inferior displacement of the distal fracture fragment peer

## 2015-07-24 IMAGING — CT CT ANGIO CHEST
1 of 6 series · 5 of 36 positions shown · IV contrast (Omnipaque 300)
Comparison: Chest and clavicular radiographs 1 day prior.

CLINICAL DATA: Right clavicle fracture with pain. Assess for
vascular compromise. Dizziness and nausea.

EXAM:
CT ANGIOGRAPHY CHEST WITH CONTRAST
TECHNIQUE: Multidetector CT imaging of the chest was performed using the
standard protocol during bolus administration of intravenous
contrast. Multiplanar CT image reconstructions and MIPs were
obtained to evaluate the vascular anatomy.
CONTRAST:  100mL OMNIPAQUE IOHEXOL 350 MG/ML SOLN

[Series 4: pe 3.0 b40f · axial · 0.67mm/px · z∈[-331,-130]mm · 5 of 101 slices shown]
[im 17/101  lung]
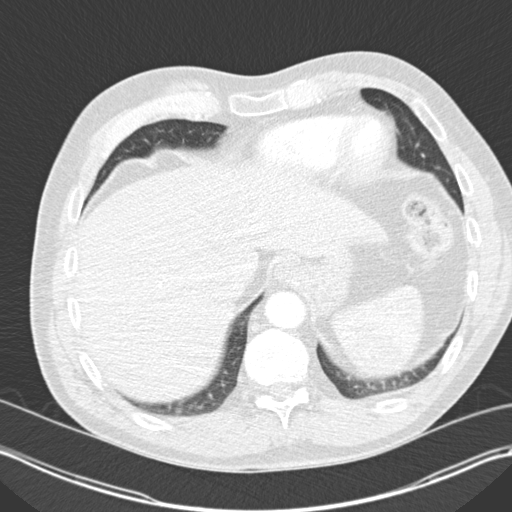
[im 34/101  mediastinal]
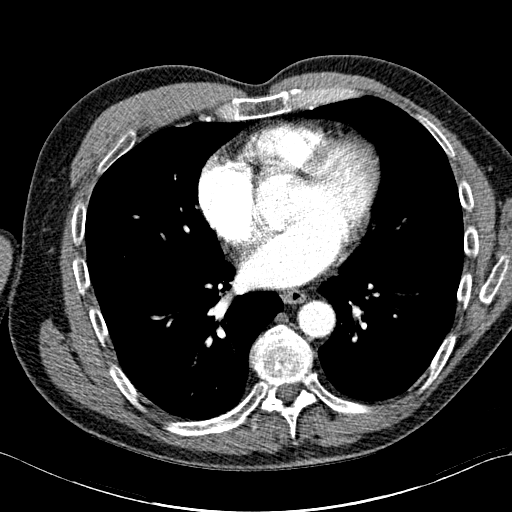
[im 51/101  lung]
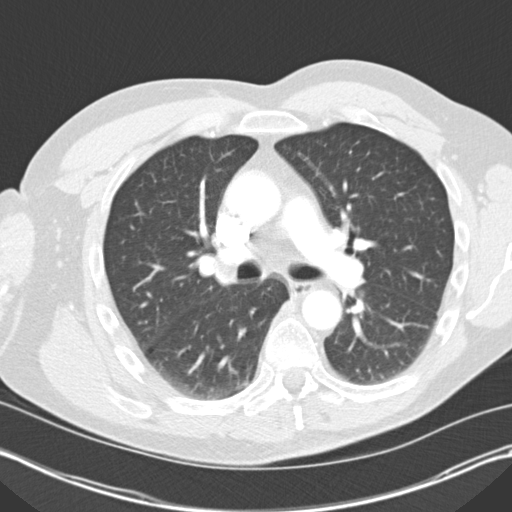
[im 67/101  mediastinal]
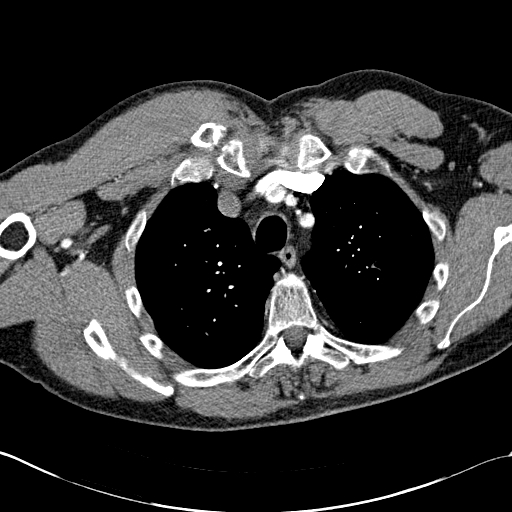
[im 84/101  lung]
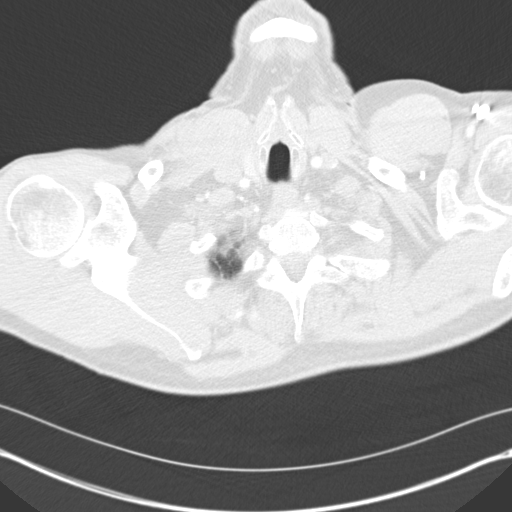

[5 of 36 positions shown; findings below may reference images not displayed]

FINDINGS: Displaced comminuted fracture of the medial right clavicle. Fracture
extends to the anterior aspect of the sternoclavicular joint. There
is adjacent soft tissue hematoma, no active extravasation. The
brachiocephalic, subclavian, and internal carotid arteries are
patent without signs of acute traumatic injury. Left subclavian and
common carotid arteries are patent and normal. Left vertebral artery
arises directly from the aorta, a normal variant. Limited evaluation
of the venous structures due to arterial phase imaging.

No acute traumatic aortic injury. No filling defects in the central
pulmonary arteries. No mediastinal hematoma. No mediastinal or hilar
adenopathy. There is no pleural or pericardial effusion. The lungs
are clear. No pneumothorax, pneumomediastinum, consolidation or
contusion. Minimal dependent atelectasis.

The sternum is intact. No rib fractures, with particular attention
to the anterior right ribs.

No acute abnormality in the included upper abdomen.

Review of the MIP images confirms the above findings.
IMPRESSION: Comminuted displaced medial right clavicle fracture with surrounding
hematoma. No subjacent arterial vascular compromise.

## 2015-07-24 MED ORDER — IOHEXOL 350 MG/ML SOLN
100.0000 mL | Freq: Once | INTRAVENOUS | Status: AC | PRN
  Administered 2015-07-24: 100 mL via INTRAVENOUS

## 2015-07-24 MED ORDER — PERCOCET 5-325 MG PO TABS
1.0000 | ORAL_TABLET | Freq: Four times a day (QID) | ORAL | Status: DC | PRN
Start: 2015-07-24 — End: 2016-11-16

## 2015-07-24 MED ORDER — OXYCODONE-ACETAMINOPHEN 5-325 MG PO TABS: 1.0000 | ORAL_TABLET | Freq: Four times a day (QID) | ORAL | Status: DC | PRN

## 2015-07-24 NOTE — ED Provider Notes (Signed)
Patient was left at change of shift to get results of his CT scan. Patient states he was going up into the hayloft to drop a down into a [place]. He states when he came back down he accidentally slipped and his shoulders moved abnormally and he felt a popping and crunching in his right side. He denies any numbness or tingling in his right fingers.  Patient is noted have moderate swelling over the medial aspect of his clavicle/sternal area. He has good distal pulses. He has normal and sensation and can wiggle his fingers.        Patient was given the results of his CT scan and need for follow-up. He also was given reasons to return to the ED such as struggling to breathe, pain, numbness, or discoloration of his fingers of his right hand. He was placed in a sling  Dg Chest 1 View  07/23/2015   CLINICAL DATA:  RIGHT-sided chest pain and swelling, fell through the hole of a loft, grabbed both sides as he fell, shoulder went the wrong way, unable to raise arms  EXAM: CHEST  1 VIEW  COMPARISON:  None  FINDINGS: Patient unable to raise arms to perform lateral view.  Normal heart size, mediastinal contours, and pulmonary vascularity.  Lungs clear.  No pleural effusion or pneumothorax.  Fracture medial RIGHT clavicle with caudal displacement of the distal clavicle 1/2 shaft width.  No additional fractures identified.  IMPRESSION: Displaced fracture medial RIGHT clavicle.   Electronically Signed   By: Ulyses Southward M.D.   On: 07/23/2015 23:09   Dg Clavicle Right  07/24/2015   CLINICAL DATA:  Medial right clavicular fracture after fall.  EXAM: RIGHT CLAVICLE - 2+ VIEWS  COMPARISON:  Chest 07/23/2015  FINDINGS: Comminuted fractures of the medial aspect of the right clavicle with inferior half width displacement of the distal fracture fragment. Distal clavicle appears intact. Coracoclavicular and acromioclavicular spaces are maintained.  IMPRESSION: Comminuted fractures of the medial right clavicle with half width  inferior displacement of the distal fracture fragment peer   Electronically Signed   By: Burman Nieves M.D.   On: 07/24/2015 01:25   Ct Angio Chest Pe W/cm &/or Wo Cm  07/24/2015   CLINICAL DATA:  Right clavicle fracture with pain. Assess for vascular compromise. Dizziness and nausea.  EXAM: CT ANGIOGRAPHY CHEST WITH CONTRAST  TECHNIQUE: Multidetector CT imaging of the chest was performed using the standard protocol during bolus administration of intravenous contrast. Multiplanar CT image reconstructions and MIPs were obtained to evaluate the vascular anatomy.  CONTRAST:  OMNIPAQUE IOHEXOL 350 MG/ML SOLN  COMPARISON:  Chest and clavicular radiographs 1 day prior.  FINDINGS: Displaced comminuted fracture of the medial right clavicle. Fracture extends to the anterior aspect of the sternoclavicular joint. There is adjacent soft tissue hematoma, no active extravasation. The brachiocephalic, subclavian, and internal carotid arteries are patent without signs of acute traumatic injury. Left subclavian and common carotid arteries are patent and normal. Left vertebral artery arises directly from the aorta, a normal variant. Limited evaluation of the venous structures due to arterial phase imaging.  No acute traumatic aortic injury. No filling defects in the central pulmonary arteries. No mediastinal hematoma. No mediastinal or hilar adenopathy. There is no pleural or pericardial effusion. The lungs are clear. No pneumothorax, pneumomediastinum, consolidation or contusion. Minimal dependent atelectasis.  The sternum is intact. No rib fractures, with particular attention to the anterior right ribs.  No acute abnormality in the included upper abdomen.  Review of the MIP images confirms the above findings.  IMPRESSION: Comminuted displaced medial right clavicle fracture with surrounding hematoma. No subjacent arterial vascular compromise.   Electronically Signed   By: Rubye Oaks M.D.   On: 07/24/2015 03:18    New Prescriptions   OXYCODONE-ACETAMINOPHEN (PERCOCET/ROXICET) 5-325 MG PER TABLET    Take 1-2 tablets by mouth every 6 (six) hours as needed for moderate pain or severe pain.   PERCOCET 5-325 MG PER TABLET    Take 1-2 tablets by mouth every 6 (six) hours as needed for moderate pain or severe pain.    Plan discharge  Devoria Albe, MD, Concha Pyo, MD 07/24/15 [phone]

## 2015-07-24 NOTE — ED Provider Notes (Signed)
CSN: [id number]     Arrival date & time 07/23/15  2155 History   First MD Initiated Contact with Patient 07/23/15 2355     Chief Complaint  Patient presents with  . Shoulder Pain     (Consider location/radiation/quality/duration/timing/severity/associated sxs/prior Treatment) HPI Comments: Patient presents today with a chief complaint of pain of his right clavicle.  He states that the pain has been present since an injury just prior to arrival.  He states that approximately an hour prior to arrival he was in a hay loft and began climbing down a ladder through a hole when he slipped and caught himself with both of his shoulders.  He has been having constant pain since that time.  He has not taken anything for pain prior to arrival.  He drove himself to the ED today.  He denies numbness or tingling of the hand.  He reports increased pain with ROM of the right arm.  He has associated swelling of the medial aspect of the clavicle.  Denies SOB.    Patient is a 54 y.o. male presenting with shoulder pain. The history is provided by the patient.  Shoulder Pain   Past Medical History  Diagnosis Date  . Eosinophilic esophagitis 2008    Previously treated with systemic corticosteroid therapy as the patient could not afford Flovent at that time. Patient was referred to the allergist but did not go to the appointment.  . Seasonal allergies   . GERD (gastroesophageal reflux disease)    Past Surgical History  Procedure Laterality Date  . Esophagogastroduodenoscopy  01/31/2007    [id number] food impaction/ Schatzki's ring, inflamed distal esophageal mucosa  . Esophagogastroduodenoscopy  02/14/2007    RMR: Somewhat ribbed appearing esophageal mucosa (biopsy-eosinophilic esophagitis), prominent Schatzki ring status post dilation with Maloney dilators.  Pale raised esophageal nodule status post biopsy (eosinophilic esophagitis). Moderate sized hiatal hernia.   . Esophagogastroduodenoscopy (egd) with  esophageal dilation N/A 04/25/2013    [id number] reflux esophagitis/eosinophilic esophagitis biopsy proven/Hiatal hernia/Antral erosions. conservative esophageal dilation   Family History  Problem Relation Age of Onset  . Colon cancer Neg Hx   . Liver disease Neg Hx    Social History  Substance Use Topics  . Smoking status: Never Smoker   . Smokeless tobacco: None  . Alcohol Use: No    Review of Systems  All other systems reviewed and are negative.     Allergies  Review of patient's allergies indicates no known allergies.  Home Medications   Prior to Admission medications   Medication Sig Start Date End Date Taking? Authorizing Provider  fluticasone (FLOVENT HFA) 220 MCG/ACT inhaler Two sprays in mouth (swallow, DO NOT INHALE) twice a day for 8 weeks. Do not eat or drink for 30 minutes after medication. 05/22/13   Tiffany Kocher, PA-C  omeprazole (PRILOSEC) 20 MG capsule Take 20 mg by mouth daily.    Historical Provider, MD   There were no vitals taken for this visit. Physical Exam  Constitutional: He appears well-developed and well-nourished.  HENT:  Head: Normocephalic and atraumatic.  Neck: Normal range of motion. Neck supple.  Cardiovascular: Normal rate, regular rhythm and normal heart sounds.   Pulses:      Radial pulses are 2+ on the right side, and 2+ on the left side.  Pulmonary/Chest: Effort normal and breath sounds normal.  Musculoskeletal:       Arms: Tenderness to palpation of the medial aspect of the left clavicle  Neurological: He is alert.  Distal sensation of the right hand intact  Skin: Skin is warm, dry and intact.  Psychiatric: He has a normal mood and affect.  Nursing note and vitals reviewed.   ED Course  Procedures (including critical care time) Labs Review Labs Reviewed - No data to display  Imaging Review Dg Chest 1 View  07/23/2015   CLINICAL DATA:  RIGHT-sided chest pain and swelling, fell through the hole of a loft, grabbed both  sides as he fell, shoulder went the wrong way, unable to raise arms  EXAM: CHEST  1 VIEW  COMPARISON:  None  FINDINGS: Patient unable to raise arms to perform lateral view.  Normal heart size, mediastinal contours, and pulmonary vascularity.  Lungs clear.  No pleural effusion or pneumothorax.  Fracture medial RIGHT clavicle with caudal displacement of the distal clavicle 1/2 shaft width.  No additional fractures identified.  IMPRESSION: Displaced fracture medial RIGHT clavicle.   Electronically Signed   By: Ulyses Southward M.D.   On: 07/23/2015 23:09   I have personally reviewed and evaluated these images and lab results as part of my medical decision-making.   EKG Interpretation None      MDM   Final diagnoses:  Injury   Patient presents today with pain of the clavicle after falling through a hole on a hay loft earlier today.  Patient with a displaced clavicular fracture.  Neurovascularly intact.  Fracture is closed.  CT angio chest ordered to ensure that there is no vascular compromise.  Patient signed out to Dr Lynelle Doctor at shift change.  CT angio chest pending.     Santiago Glad, PA-C 07/25/15 1316  Laurence Spates, MD 07/25/15 [phone]

## 2015-07-24 NOTE — Discharge Instructions (Signed)
You have a fracture of your collarbone that is close to your sternum, or breastbone. Wear the sling for comfort. Use ice packs to help get the swelling down. I gave you the number of the local orthopedists in town to call for follow-up. They may want you to see another orthopedic specialist, that is their prerogative. Take the pain medication as prescribed. Return to the emergency department if you have any problems breathing, numbness, pain, or your fingers of the right hand getting pale or cold to touch.   Clavicle Fracture A clavicle fracture is a broken collarbone. The collarbone is the long bone that connects your shoulder to your rib cage. A broken collarbone may be treated with a sling, a wrap, or surgery. Treatment depends on whether the broken ends of the bone are out of place or not. HOME CARE  Put ice on the injured area:  Put ice in a plastic bag.  Place a towel between your skin and the bag.  Leave the ice on for 20 minutes, 2-3 times a day.  If you have a wrap or splint:  Wear it all the time, and remove it only to take a bath or shower.  When you bathe or shower, keep your shoulder in the same place as when the sling or wrap is on.  Do not lift your arm.  If you have a wrap:  Another person must tighten it every day.  It should be tight enough to hold your shoulders back.  Make sure you have enough room to put your pointer finger between your body and the strap.  Loosen the wrap right away if you cannot feel your arm or your hands tingle.  Only take medicines as told by your doctor.  Avoid activities that make the injury or pain worse for 4-6 weeks after surgery.  Keep all follow-up appointments. GET HELP IF:  Your medicine is not making you feel less pain.  Your medicine is not making swelling better. GET HELP RIGHT AWAY IF:   Your cannot feel your arm.  Your arm is cold.  Your arm is a lighter color than normal. MAKE SURE YOU:   Understand these  instructions.  Will watch your condition.  Will get help right away if you are not doing well or get worse. Document Released: 05/01/2008 Document Revised: 11/18/2013 Document Reviewed: 08/31/2009 [facility] Patient Information 2015 [place], [place]. This information is not intended to replace advice given to you by your health care provider. Make sure you discuss any questions you have with your health care provider.  Hematoma A hematoma is a collection of blood. The collection of blood can turn into a hard, painful lump under the skin. Your skin may turn blue or yellow if the hematoma is close to the surface of the skin. Most hematomas get better in a few days to weeks. Some hematomas are serious and need medical care. Hematomas can be very small or very big. HOME CARE  Apply ice to the injured area:  Put ice in a plastic bag.  Place a towel between your skin and the bag.  Leave the ice on for 20 minutes, 2-3 times a day for the first 1 to 2 days.  After the first 2 days, switch to using warm packs on the injured area.  Raise (elevate) the injured area to lessen pain and puffiness (swelling). You may also wrap the area with an elastic bandage. Make sure the bandage is not wrapped too tight.  If  you have a painful hematoma on your leg or foot, you may use crutches for a couple days.  Only take medicines as told by your doctor. GET HELP RIGHT AWAY IF:   Your pain gets worse.  Your pain is not controlled with medicine.  You have a fever.  Your puffiness gets worse.  Your skin turns more blue or yellow.  Your skin over the hematoma breaks or starts bleeding.  Your hematoma is in your chest or belly (abdomen) and you are short of breath, feel weak, or have a change in consciousness.  Your hematoma is on your scalp and you have a headache that gets worse or a change in alertness or consciousness. MAKE SURE YOU:   Understand these instructions.  Will watch your  condition.  Will get help right away if you are not doing well or get worse. Document Released: 12/21/2004 Document Revised: 07/16/2013 Document Reviewed: 04/23/2013 [facility] Patient Information 2015 [place], [place]. This information is not intended to replace advice given to you by your health care provider. Make sure you discuss any questions you have with your health care provider.

## 2015-07-27 DIAGNOSIS — S42009A Fracture of unspecified part of unspecified clavicle, initial encounter for closed fracture: Secondary | ICD-10-CM

## 2015-07-27 HISTORY — DX: Fracture of unspecified part of unspecified clavicle, initial encounter for closed fracture: S42.009A

## 2015-07-28 MED FILL — Oxycodone w/ Acetaminophen Tab 5-325 MG: ORAL | Qty: 6 | Status: AC

## 2015-08-05 ENCOUNTER — Encounter: Payer: Self-pay | Admitting: Family Medicine

## 2015-08-05 ENCOUNTER — Ambulatory Visit (INDEPENDENT_AMBULATORY_CARE_PROVIDER_SITE_OTHER): Payer: 59 | Admitting: Family Medicine

## 2015-08-05 VITALS — BP 140/100 | HR 86 | Temp 99.5°F | Ht 72.0 in | Wt 181.0 lb

## 2015-08-05 DIAGNOSIS — S42001A Fracture of unspecified part of right clavicle, initial encounter for closed fracture: Secondary | ICD-10-CM | POA: Diagnosis not present

## 2015-08-05 NOTE — Progress Notes (Signed)
BP 140/100 mmHg  Pulse 86  Temp(Src) 99.5 F (37.5 C) (Oral)  Ht 6' (1.829 m)  Wt 181 lb (82.101 kg)  BMI 24.54 kg/m2   Subjective:    Patient ID: [name], male    DOB: 10/17/1961, 54 y.o.   MRN: [id number]  HPI: [name] is a 54 y.o. male presenting on 08/05/2015 for Referral   HPI Right clavicle fracture Patient had a fall a week ago out of rafters of his barn when he was throwing ills of hay and fractured his right clavicle on the proximal end. He has had significant bruising and swelling in that region and then across his chest as well. He went to the emergency department in [place] where they recommended that he needed to go see an orthopedic surgeon. He presents today because he needs a referral to an orthopedic surgeon or his insurance as a new patient. Currently he denies any fevers or chills and has not had any and he also denies any major pain except with movement. He is continuing to use the sling that they gave him and he is only occasionally uses the Percocet that they get.  Relevant past medical, surgical, family and social history reviewed and updated as indicated. Interim medical history since our last visit reviewed. Allergies and medications reviewed and updated.  Review of Systems  Constitutional: Negative for fever and chills.  HENT: Negative for ear discharge and ear pain.   Eyes: Negative for discharge and visual disturbance.  Respiratory: Negative for shortness of breath and wheezing.   Cardiovascular: Negative for chest pain and leg swelling.  Gastrointestinal: Negative for abdominal pain, diarrhea and constipation.  Genitourinary: Negative for difficulty urinating.  Musculoskeletal: Positive for joint swelling and arthralgias (pain at fracture site). Negative for back pain and gait problem.  Skin: Positive for color change (bruising on anterior chest). Negative for rash.  Neurological: Negative for dizziness, syncope, weakness, light-headedness  and headaches.  All other systems reviewed and are negative.   Per HPI unless specifically indicated above     Medication List       This list is accurate as of: 08/05/15 10:05 AM.  Always use your most recent med list.               fluticasone 220 MCG/ACT inhaler  Commonly known as:  FLOVENT HFA  Two sprays in mouth (swallow, DO NOT INHALE) twice a day for 8 weeks. Do not eat or drink for 30 minutes after medication.     guaiFENesin 600 MG 12 hr tablet  Commonly known as:  MUCINEX  Take by mouth 2 (two) times daily.     omeprazole 20 MG capsule  Commonly known as:  PRILOSEC  Take 20 mg by mouth daily.     PERCOCET 5-325 MG per tablet  Generic drug:  oxyCODONE-acetaminophen  Take 1-2 tablets by mouth every 6 (six) hours as needed for moderate pain or severe pain.     oxyCODONE-acetaminophen 5-325 MG per tablet  Commonly known as:  PERCOCET/ROXICET  Take 1-2 tablets by mouth every 6 (six) hours as needed for moderate pain or severe pain.           Objective:    BP 140/100 mmHg  Pulse 86  Temp(Src) 99.5 F (37.5 C) (Oral)  Ht 6' (1.829 m)  Wt 181 lb (82.101 kg)  BMI 24.54 kg/m2  Wt Readings from Last 3 Encounters:  08/05/15 181 lb (82.101 kg)  05/22/13 184 lb (  83.462 kg)  04/10/13 186 lb (84.369 kg)    Physical Exam  Constitutional: He is oriented to person, place, and time. He appears well-developed and well-nourished. No distress.  Patient wearing sling on right arm  Eyes: Conjunctivae and EOM are normal. Pupils are equal, round, and reactive to light. Right eye exhibits no discharge. No scleral icterus.  Cardiovascular: Normal rate, regular rhythm, normal heart sounds and intact distal pulses.   No murmur heard. Pulmonary/Chest: Effort normal and breath sounds normal. No respiratory distress. He has no wheezes. He has no rales.  Abdominal: He exhibits no distension.  Musculoskeletal: Normal range of motion. He exhibits tenderness (over medial aspect of  clavicle). He exhibits no edema.  Neurological: He is alert and oriented to person, place, and time. Coordination normal.  Skin: Skin is warm and dry. No rash noted. He is not diaphoretic.  Bruising over entire anterior chest that appears to be healing.  Psychiatric: He has a normal mood and affect. His behavior is normal.  Vitals reviewed.    No results found for this or any previous visit.    Assessment & Plan:   Problem List Items Addressed This Visit    None    Visit Diagnoses    Right clavicle fracture, closed, initial encounter    -  Primary    Patient presents with need for referral from right clavicular fracture    Relevant Orders    Ambulatory referral to Orthopedic Surgery        Follow up plan: Return in about 3 months (around 11/04/2015), or if symptoms worsen or fail to improve, for for adult well exam.  Arville Care, MD [facility] 08/05/2015, 10:05 AM

## 2015-08-05 NOTE — Patient Instructions (Signed)
Clavicle Fracture °A clavicle fracture is a broken collarbone. The collarbone is the long bone that connects your shoulder to your rib cage. A broken collarbone may be treated with a sling, a wrap, or surgery. Treatment depends on whether the broken ends of the bone are out of place or not. °HOME CARE °· Put ice on the injured area: °¨ Put ice in a plastic bag. °¨ Place a towel between your skin and the bag. °¨ Leave the ice on for 20 minutes, 2-3 times a day. °· If you have a wrap or splint: °¨ Wear it all the time, and remove it only to take a bath or shower. °¨ When you bathe or shower, keep your shoulder in the same place as when the sling or wrap is on. °¨ Do not lift your arm. °· If you have a wrap: °¨ Another person must tighten it every day. °¨ It should be tight enough to hold your shoulders back. °¨ Make sure you have enough room to put your pointer finger between your body and the strap. °¨ Loosen the wrap right away if you cannot feel your arm or your hands tingle. °· Only take medicines as told by your doctor. °· Avoid activities that make the injury or pain worse for 4-6 weeks after surgery. °· Keep all follow-up appointments. °GET HELP IF: °· Your medicine is not making you feel less pain. °· Your medicine is not making swelling better. °GET HELP RIGHT AWAY IF:  °· Your cannot feel your arm. °· Your arm is cold. °· Your arm is a lighter color than normal. °MAKE SURE YOU:  °· Understand these instructions. °· Will watch your condition. °· Will get help right away if you are not doing well or get worse. °Document Released: 05/01/2008 Document Revised: 11/18/2013 Document Reviewed: 08/31/2009 °[employer]® Patient Information ©2015 [employer]. This information is not intended to replace advice given to you by your health care provider. Make sure you discuss any questions you have with your health care provider. ° °

## 2015-08-16 ENCOUNTER — Ambulatory Visit (HOSPITAL_COMMUNITY): Payer: 59 | Attending: Orthopedic Surgery | Admitting: Specialist

## 2015-08-16 DIAGNOSIS — X58XXXS Exposure to other specified factors, sequela: Secondary | ICD-10-CM | POA: Diagnosis not present

## 2015-08-16 DIAGNOSIS — M629 Disorder of muscle, unspecified: Secondary | ICD-10-CM | POA: Diagnosis present

## 2015-08-16 DIAGNOSIS — M25511 Pain in right shoulder: Secondary | ICD-10-CM | POA: Diagnosis present

## 2015-08-16 DIAGNOSIS — S42001S Fracture of unspecified part of right clavicle, sequela: Secondary | ICD-10-CM | POA: Diagnosis present

## 2015-08-16 DIAGNOSIS — M25611 Stiffness of right shoulder, not elsewhere classified: Secondary | ICD-10-CM | POA: Diagnosis present

## 2015-08-16 NOTE — Therapy (Signed)
[facility] [address] [place], [place], [address] Phone: [phone]   Fax:  [phone]  Occupational Therapy Evaluation  Patient Details  Name: [name] MRN: [id number] Date of Birth: 01-Dec-1960 Referring Provider:  Kevan Rosebush, MD  Encounter Date: 08/16/2015      OT End of Session - 08/16/15 1600    Visit Number 1   Number of Visits 16   Date for OT Re-Evaluation 10/15/15  mini reassess on 10/17   Authorization Type [employer]   Authorization Time Period n/a   OT Start Time 1435   OT Stop Time 1515   OT Time Calculation (min) 40 min   Activity Tolerance Patient tolerated treatment well   Behavior During Therapy [facility] for tasks assessed/performed      Past Medical History  Diagnosis Date  . Eosinophilic esophagitis 2008    Previously treated with systemic corticosteroid therapy as the patient could not afford Flovent at that time. Patient was referred to the allergist but did not go to the appointment.  . Seasonal allergies   . GERD (gastroesophageal reflux disease)   . Clavicle fracture 07/27/2015    Past Surgical History  Procedure Laterality Date  . Esophagogastroduodenoscopy  01/31/2007    ONG:EXBMWUXLKG food impaction/ Schatzki's ring, inflamed distal esophageal mucosa  . Esophagogastroduodenoscopy  02/14/2007    RMR: Somewhat ribbed appearing esophageal mucosa (biopsy-eosinophilic esophagitis), prominent Schatzki ring status post dilation with Maloney dilators.  Pale raised esophageal nodule status post biopsy (eosinophilic esophagitis). Moderate sized hiatal hernia.   . Esophagogastroduodenoscopy (egd) with esophageal dilation N/A 04/25/2013    MWN:UUVOZDG reflux esophagitis/eosinophilic esophagitis biopsy proven/Hiatal hernia/Antral erosions. conservative esophageal dilation    There were no vitals filed for this visit.  Visit Diagnosis:  Right anterior shoulder pain  Shoulder stiffness, right  Clavicle  fracture, right, sequela      Subjective Assessment - 08/16/15 1550    Subjective  S:  I fell off of a hay loft ladder and broke my collar bone.  I want to be able to play music and bowl again.   Pertinent History [name] fell while climbing down from a hay loft approximately 4 weeks ago, fracturing his right clavicle.  He went to the [facility] ED and was seen by Dr. Hilda Lias and placed in a sling.  He was then referred to Dr. [name] Fireman who discontinued the sling and ordered OT to evaluate and treat.   Limitations WBAT RUE, ROM as tolerated, no lifting over 10 pounds, no pushing or pulling   Special Tests FOTO 45/100   Patient Stated Goals I want to play music and bowl again.   Currently in Pain? Yes   Pain Score 4    Pain Location Shoulder   Pain Orientation Right   Pain Descriptors / Indicators Aching   Pain Type Acute pain   Pain Onset More than a month ago   Pain Frequency Intermittent   Aggravating Factors  movement   Pain Relieving Factors rest   Effect of Pain on Daily Activities unable to use RUE as dominant           [facility] OT Assessment - 08/16/15 0001    Assessment   Diagnosis S/P Right Clavicle Fracture   Onset Date --  4 weeks ago   Prior Therapy none   Precautions   Precautions Shoulder   Type of Shoulder Precautions ROM as tolerated, WBAT, no lifting over 10 pounds, no pushing or pulling    Restrictions  Weight Bearing Restrictions No   Balance Screen   Has the patient fallen in the past 6 months Yes   How many times? 1   Has the patient had a decrease in activity level because of a fear of falling?  No   Is the patient reluctant to leave their home because of a fear of falling?  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  Unable to reach overhead, lift anything, unable to complete work duties at both jobs.    Written Expression   Dominant Hand Right   Vision - History   Baseline Vision No visual deficits   Cognition   Overall Cognitive Status Within Functional Limits for tasks assessed   Observation/Other Assessments   Focus on Therapeutic Outcomes (FOTO)  45/100   Sensation   Light Touch Appears Intact   Coordination   Gross Motor Movements are Fluid and Coordinated Yes   Fine Motor Movements are Fluid and Coordinated Yes   ROM / Strength   AROM / PROM / Strength AROM;PROM;Strength   AROM   Overall AROM Comments assessed in supine ER/IR with shoulder adducted   AROM Assessment Site Shoulder   Right/Left Shoulder Right   Right Shoulder Flexion 45 Degrees   Right Shoulder ABduction 65 Degrees   Right Shoulder Internal Rotation 82 Degrees   Right Shoulder External Rotation 60 Degrees   PROM   Overall PROM Comments assessed in supine ER/IR with shoulder adducted    PROM Assessment Site Shoulder   Right/Left Shoulder Right   Right Shoulder Flexion 127 Degrees   Right Shoulder ABduction 117 Degrees   Right Shoulder Internal Rotation 82 Degrees   Right Shoulder External Rotation 82 Degrees   Strength   Overall Strength Comments strength not tested due to recent surgery   Strength Assessment Site Shoulder   Right/Left Shoulder Right                  OT Treatments/Exercises (OP) - 08/16/15 0001    Manual Therapy   Manual Therapy Myofascial release   Myofascial Release MFR and manual stretching to anterior right shoulder, clavicle region, posterior shoulder region and upper arm to decrease pain and swelling and improve pain free mobility.                OT Education - 08/16/15 1559    Education provided Yes   Education Details towel slides and shoulder  shrugs   Person(s) Educated Patient   Methods Explanation;Demonstration;Handout   Comprehension Verbalized understanding;Returned demonstration          OT Short Term Goals - 08/16/15 1603    OT SHORT TERM GOAL #1   Title Patient will be educated on HEP.   Time 4   Period Weeks   Status New   OT SHORT TERM GOAL #2   Title Patient will improve right shoulder PROM to WNL for increased ability to reach overhead at work and home.    Time 4   Period Weeks   Status New   OT SHORT TERM GOAL #3  Title Patient will improve right shoulder strength to 3+/5 for increased ability to lift horse feed.    Time 4   Period Weeks   Status New   OT SHORT TERM GOAL #4   Title Patient will decrease fascial restrictions and swelling to moderate in his right shoulder region.    Time 4   Period Weeks   Status New   OT SHORT TERM GOAL #5   Title Patient will decrease pain in his right shoulder region to 3/10 or better with activity.    Time 4   Period Weeks   Status New           OT Long Term Goals - 08/16/15 1605    OT LONG TERM GOAL #1   Title Patient will return to prior level of independence with all daily, work, and leisure activities.    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    Time 8   Period Weeks   Status New   OT LONG TERM GOAL #4   Title Patient will decrease fascial restrictions and swelling to trace in right shoulder region.   Time 8   Period Weeks   Status New   OT LONG TERM GOAL #5   Title Patient will decrease pain in his right shoulder region to 2/10 or better.    Time 8   Period Weeks   Status New               Plan - 08/16/15 1601     Clinical Impression Statement A:  Patient is a 54 year old male who lives alone that fell off of a hay loft, fracturing his right clavicle.  He was in a sling for 3 weeks, and that was removed last week.  He has difficulties using his right arm as dominant, reaching overhead, he is not able to complete his job duties, leisure activities, or lift anything heavy.     Pt will benefit from skilled therapeutic intervention in order to improve on the following deficits (Retired) Decreased range of motion;Decreased strength;Increased fascial restricitons;Increased muscle spasms;Pain;Impaired UE functional use   Rehab Potential Good   OT Frequency 2x / week   OT Duration 8 weeks   OT Treatment/Interventions Self-care/ADL training;Electrical Stimulation;Moist Heat;Therapeutic exercise;Manual Therapy;Passive range of motion;Therapeutic activities;Patient/family education   Plan P:  Skilled OT intervention to increase P/AROM and strength to WNL and decrease pain, edema, and fascial restrictions.  Next session:  MFR and manual stretching to right upper extremity and cervical region.  PROM, AAROM, progress to AROM as tolerated.    Consulted and Agree with Plan of Care Patient        Problem List Patient Active Problem List   Diagnosis Date Noted  . GERD (gastroesophageal reflux disease) 04/10/2013  . Eosinophilic esophagitis 04/10/2013  . Esophageal dysphagia 04/10/2013    Shirlean Mylar, OTR/L [phone]  08/16/2015, 4:14 PM   [facility] [address] [place], [place], [address] Phone: [phone]   Fax:  [phone]

## 2015-08-16 NOTE — Patient Instructions (Signed)
Complete each exercise for 1-3 minutes, 2-3 times per day.  SHOULDER: Flexion On Table   Place hands on table, elbows straight. Slide arms across table. Press hands down into table. Hold ___ seconds. ___ reps per set, ___ sets per day, ___ days per week  Abduction (Passive)   With arm out to side, palm down, resting on table, slide arm across table, keeping trunk away from table. Hold ____ seconds. Repeat ____ times. Do ____ sessions per day.  Copyright  VHI. All rights reserved.     Internal Rotation (Assistive)   Seated with elbow bent at right angle and held against side, slide arm on table surface in an inward arc. Repeat ____ times. Do ____ sessions per day. Activity: Use this motion to brush crumbs off the table.  Copyright  VHI. All rights reserved.

## 2015-08-19 ENCOUNTER — Encounter (HOSPITAL_COMMUNITY): Payer: Self-pay

## 2015-08-19 ENCOUNTER — Ambulatory Visit (HOSPITAL_COMMUNITY): Payer: 59

## 2015-08-19 DIAGNOSIS — M6289 Other specified disorders of muscle: Secondary | ICD-10-CM

## 2015-08-19 DIAGNOSIS — M25611 Stiffness of right shoulder, not elsewhere classified: Secondary | ICD-10-CM

## 2015-08-19 DIAGNOSIS — M629 Disorder of muscle, unspecified: Secondary | ICD-10-CM

## 2015-08-19 DIAGNOSIS — M25511 Pain in right shoulder: Secondary | ICD-10-CM

## 2015-08-19 NOTE — Addendum Note (Signed)
Addended by: Shirlean Mylar on: 08/19/2015 02:42 PM   Modules accepted: Orders

## 2015-08-19 NOTE — Therapy (Signed)
[facility] [address] [place], [place], [address] Phone: [phone]   Fax:  [phone]  Occupational Therapy Treatment  Patient Details  Name: [name] MRN: [id number] Date of Birth: Dec 03, 1960 Referring Provider:  Dettinger, Elige Radon, MD  Encounter Date: 08/19/2015      OT End of Session - 08/19/15 1432    Visit Number 2   Number of Visits 16   Date for OT Re-Evaluation 10/15/15  mini reassess on 10/17   Authorization Type UHC   Authorization Time Period n/a   OT Start Time 1350   OT Stop Time 1430   OT Time Calculation (min) 40 min   Activity Tolerance Patient tolerated treatment well   Behavior During Therapy [facility] for tasks assessed/performed      Past Medical History  Diagnosis Date  . Eosinophilic esophagitis 2008    Previously treated with systemic corticosteroid therapy as the patient could not afford Flovent at that time. Patient was referred to the allergist but did not go to the appointment.  . Seasonal allergies   . GERD (gastroesophageal reflux disease)   . Clavicle fracture 07/27/2015    Past Surgical History  Procedure Laterality Date  . Esophagogastroduodenoscopy  01/31/2007    ONG:[id number] food impaction/ Schatzki's ring, inflamed distal esophageal mucosa  . Esophagogastroduodenoscopy  02/14/2007    RMR: Somewhat ribbed appearing esophageal mucosa (biopsy-eosinophilic esophagitis), prominent Schatzki ring status post dilation with Maloney dilators.  Pale raised esophageal nodule status post biopsy (eosinophilic esophagitis). Moderate sized hiatal hernia.   . Esophagogastroduodenoscopy (egd) with esophageal dilation N/A 04/25/2013    MWN:UUVOZDG reflux esophagitis/eosinophilic esophagitis biopsy proven/Hiatal hernia/Antral erosions. conservative esophageal dilation    There were no vitals filed for this visit.  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    Limitations WBAT RUE, ROM as tolerated, no lifting over 10 pounds, no pushing or pulling   Currently in Pain? Yes   Pain Score 7    Pain Location Shoulder   Pain Orientation Right   Pain Descriptors / Indicators Sore   Pain Type Acute pain            [facility] OT Assessment - 08/19/15 1419    Assessment   Diagnosis S/P Right Clavicle Fracture   Precautions   Precautions Shoulder   Type of Shoulder Precautions ROM as tolerated, WBAT, no lifting over 10 pounds, no pushing or pulling                   OT Treatments/Exercises (OP) - 08/19/15 1419    Exercises   Exercises Shoulder   Shoulder Exercises: Supine   Protraction PROM;5 reps;AROM;10 reps   Horizontal ABduction PROM;5 reps;AROM;10 reps   External Rotation PROM;5 reps;AROM;10 reps   Internal Rotation PROM;5 reps;AROM;10 reps   Flexion PROM;5 reps;AROM;10 reps   ABduction PROM;5 reps;AROM;10 reps   Shoulder Exercises: Standing   Extension AROM;10 reps   Row AROM;10 reps   Shoulder Elevation AROM;10 reps   Manual Therapy   Manual Therapy Myofascial release   Myofascial Release Myofascial release and manual stretching to anterior right shoulder, clavicle region, posterior shoulder region and upper arm to decrease pain  and swelling and improve pain free mobility.                 OT Education - 08/19/15 1440    Education provided Yes   Education Details Patient given print out of OT evaluation, plan of care and goals reviewed. Also discussed precautions and what he shoulder do and not do at work.   Person(s) Educated Patient   Methods Explanation;Handout   Comprehension Verbalized understanding          OT Short Term Goals - 08/19/15 1433    OT SHORT TERM GOAL #1   Title Patient  will be educated on HEP.   Time 4   Period Weeks   Status On-going   OT SHORT TERM GOAL #2   Title Patient will improve right shoulder PROM to WNL for increased ability to reach overhead at work and home.    Time 4   Period Weeks   Status On-going   OT SHORT TERM GOAL #3   Title Patient will improve right shoulder strength to 3+/5 for increased ability to lift horse feed.    Time 4   Period Weeks   Status On-going   OT SHORT TERM GOAL #4   Title Patient will decrease fascial restrictions and swelling to moderate in his right shoulder region.    Time 4   Period Weeks   Status On-going   OT SHORT TERM GOAL #5   Title Patient will decrease pain in his right shoulder region to 3/10 or better with activity.    Time 4   Period Weeks   Status On-going           OT Long Term Goals - 08/19/15 1436    OT LONG TERM GOAL #1   Title Patient will return to prior level of independence with all daily, work, and leisure activities.    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    Time 8   Period Weeks   Status On-going   OT LONG TERM GOAL #4   Title Patient will decrease fascial restrictions and swelling to trace in right shoulder region.   Time 8   Period Weeks   Status On-going   OT LONG TERM GOAL #5   Title Patient will decrease pain in his right shoulder region to 2/10 or better.    Time 8   Period Weeks   Status On-going               Plan - 08/19/15 1432    Clinical Impression Statement A: Initiated myofascial release, manual stretching, and A/ROM exercises of the right shoulder. Patient has full P/ROM this session. Noted that right clavicle  does wing out a lot when patient is standing.    Plan P: Add A/ROM standing if able to tolerate. Cont with no pulling, pushing. or lifting more than 10lbs.         Problem List Patient Active Problem List   Diagnosis Date Noted  . GERD (gastroesophageal reflux disease) 04/10/2013  . Eosinophilic esophagitis 04/10/2013  . Esophageal  dysphagia 04/10/2013    Limmie Patricia, OTR/L,CBIS  [phone]  08/19/2015, 2:41 PM  [facility] [address] [place], [place], [address] Phone: [phone]   Fax:  [phone]

## 2015-08-24 ENCOUNTER — Encounter (HOSPITAL_COMMUNITY): Payer: Self-pay | Admitting: Occupational Therapy

## 2015-08-24 ENCOUNTER — Ambulatory Visit (HOSPITAL_COMMUNITY): Payer: 59 | Admitting: Occupational Therapy

## 2015-08-24 DIAGNOSIS — M6289 Other specified disorders of muscle: Secondary | ICD-10-CM

## 2015-08-24 DIAGNOSIS — M629 Disorder of muscle, unspecified: Secondary | ICD-10-CM

## 2015-08-24 DIAGNOSIS — M25511 Pain in right shoulder: Secondary | ICD-10-CM

## 2015-08-24 DIAGNOSIS — M25611 Stiffness of right shoulder, not elsewhere classified: Secondary | ICD-10-CM

## 2015-08-24 NOTE — Therapy (Signed)
[place], [place], [address] Phone: [phone]   Fax:  [phone]  Occupational Therapy Treatment  Patient Details  Name: [name] MRN: [id number] Date of Birth: 02-01-1961 Referring Provider:  Sallye Lat Tyler-Par*  Encounter Date: 08/24/2015      OT End of Session - 08/24/15 1236    Visit Number 3   Number of Visits 16   Date for OT Re-Evaluation 10/15/15  mini reassess on 10/17   Authorization Type UHC   Authorization Time Period n/a   OT Start Time 1107   OT Stop Time 1145   OT Time Calculation (min) 38 min   Activity Tolerance Patient tolerated treatment well   Behavior During Therapy [facility] for tasks assessed/performed      Past Medical History  Diagnosis Date  . Eosinophilic esophagitis 4665    Previously treated with systemic corticosteroid therapy as the patient could not afford Flovent at that time. Patient was referred to the allergist but did not go to the appointment.  . Seasonal allergies   . GERD (gastroesophageal reflux disease)   . Clavicle fracture 07/27/2015    Past Surgical History  Procedure Laterality Date  . Esophagogastroduodenoscopy  01/31/2007    LDJ:[id number] food impaction/ Schatzki's ring, inflamed distal esophageal mucosa  . Esophagogastroduodenoscopy  02/14/2007    RMR: Somewhat ribbed appearing esophageal mucosa (biopsy-eosinophilic esophagitis), prominent Schatzki ring status post dilation with Maloney dilators.  Pale raised esophageal nodule status post biopsy (eosinophilic esophagitis). Moderate sized hiatal hernia.   . Esophagogastroduodenoscopy (egd) with esophageal dilation N/A 04/25/2013    ZES:PQZRAQT reflux esophagitis/eosinophilic esophagitis biopsy proven/Hiatal hernia/Antral erosions. conservative esophageal dilation    There were no vitals filed for this visit.  Visit Diagnosis:  Shoulder stiffness, right  Pain in joint, shoulder region,  right  Tight fascia  Right anterior shoulder pain      Subjective Assessment - 08/24/15 1110    Subjective  S: It's much better than it was.    Currently in Pain? Yes   Pain Score 7    Pain Location Shoulder   Pain Orientation Right   Pain Descriptors / Indicators Sore   Pain Type Acute pain            [facility] OT Assessment - 08/24/15 1129    Assessment   Diagnosis S/P Right Clavicle Fracture   Precautions   Precautions Shoulder   Type of Shoulder Precautions ROM as tolerated, WBAT, no lifting over 10 pounds, no pushing or pulling                   OT Treatments/Exercises (OP) - 08/24/15 1128    Exercises   Exercises Shoulder   Shoulder Exercises: Supine   Protraction PROM;5 reps;AROM;10 reps   Horizontal ABduction PROM;5 reps;AROM;10 reps   External Rotation PROM;5 reps;AROM;10 reps   Internal Rotation PROM;5 reps;AROM;10 reps   Flexion PROM;5 reps;AROM;10 reps   ABduction PROM;5 reps;AROM;10 reps   Shoulder Exercises: Standing   Protraction AROM;10 reps   Horizontal ABduction AROM;10 reps   External Rotation AROM;10 reps   Internal Rotation AROM;10 reps   Flexion AROM;10 reps   ABduction AROM;10 reps   Extension AROM;10 reps   Row AROM;10 reps   Shoulder Elevation AROM;10 reps   Manual Therapy   Manual Therapy Myofascial release   Myofascial Release Myofascial release and manual stretching to anterior right shoulder, clavicle region, posterior shoulder region and upper arm to decrease pain and swelling  and improve pain free mobility.                   OT Short Term Goals - 08/19/15 1433    OT SHORT TERM GOAL #1   Title Patient will be educated on HEP.   Time 4   Period Weeks   Status On-going   OT SHORT TERM GOAL #2   Title Patient will improve right shoulder PROM to WNL for increased ability to reach overhead at work and home.    Time 4   Period Weeks   Status On-going   OT SHORT TERM GOAL #3   Title Patient will improve right  shoulder strength to 3+/5 for increased ability to lift horse feed.    Time 4   Period Weeks   Status On-going   OT SHORT TERM GOAL #4   Title Patient will decrease fascial restrictions and swelling to moderate in his right shoulder region.    Time 4   Period Weeks   Status On-going   OT SHORT TERM GOAL #5   Title Patient will decrease pain in his right shoulder region to 3/10 or better with activity.    Time 4   Period Weeks   Status On-going           OT Long Term Goals - 08/19/15 1436    OT LONG TERM GOAL #1   Title Patient will return to prior level of independence with all daily, work, and leisure activities.    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    Time 8   Period Weeks   Status On-going   OT LONG TERM GOAL #4   Title Patient will decrease fascial restrictions and swelling to trace in right shoulder region.   Time 8   Period Weeks   Status On-going   OT LONG TERM GOAL #5   Title Patient will decrease pain in his right shoulder region to 2/10 or better.    Time 8   Period Weeks   Status On-going               Plan - 08/24/15 1236    Clinical Impression Statement A: Added AROM in standing, prot/ret/elev/dep. Cont PROM and AAROM in supine. Pt tolerated treatment well, complaining of minimal increase in soreness at end of session. Pt requires verbal cuing for form during AROM exercises. 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    Plan P: Cont AROM exercises, increase to 12 in supine. Cont with no pulling/pushing/lifting over 10 lbs.         Problem List Patient Active Problem  List   Diagnosis Date Noted  . GERD (gastroesophageal reflux disease) 04/10/2013  . Eosinophilic esophagitis 08/65/7846  . Esophageal dysphagia 04/10/2013    Guadelupe Sabin, OTR/L  [phone]  08/24/2015, 12:41 PM  [facility] [address] [place], [place], [address] Phone: [phone]   Fax:  [phone]

## 2015-08-26 ENCOUNTER — Encounter (HOSPITAL_COMMUNITY): Payer: Self-pay | Admitting: Occupational Therapy

## 2015-08-26 ENCOUNTER — Ambulatory Visit (HOSPITAL_COMMUNITY): Payer: 59 | Admitting: Occupational Therapy

## 2015-08-26 DIAGNOSIS — M629 Disorder of muscle, unspecified: Secondary | ICD-10-CM

## 2015-08-26 DIAGNOSIS — M25611 Stiffness of right shoulder, not elsewhere classified: Secondary | ICD-10-CM

## 2015-08-26 DIAGNOSIS — M6289 Other specified disorders of muscle: Secondary | ICD-10-CM

## 2015-08-26 DIAGNOSIS — M25511 Pain in right shoulder: Secondary | ICD-10-CM

## 2015-08-26 NOTE — Therapy (Signed)
[facility] [address] [place], [place], [address] Phone: [phone]   Fax:  [phone]  Occupational Therapy Treatment  Patient Details  Name: [name] MRN: [id number] Date of Birth: 1961/02/14 Referring Provider:  Kevan Rosebush Tyler-Par*  Encounter Date: 08/26/2015      OT End of Session - 08/26/15 1437    Visit Number 4   Number of Visits 16   Date for OT Re-Evaluation 10/15/15  mini reassess on 10/17   Authorization Type UHC   Authorization Time Period n/a   OT Start Time 1348   OT Stop Time 1445   OT Time Calculation (min) 57 min   Activity Tolerance Patient tolerated treatment well   Behavior During Therapy [facility] for tasks assessed/performed      Past Medical History  Diagnosis Date  . Eosinophilic esophagitis 2008    Previously treated with systemic corticosteroid therapy as the patient could not afford Flovent at that time. Patient was referred to the allergist but did not go to the appointment.  . Seasonal allergies   . GERD (gastroesophageal reflux disease)   . Clavicle fracture 07/27/2015    Past Surgical History  Procedure Laterality Date  . Esophagogastroduodenoscopy  01/31/2007    VOZ:[id number] food impaction/ Schatzki's ring, inflamed distal esophageal mucosa  . Esophagogastroduodenoscopy  02/14/2007    RMR: Somewhat ribbed appearing esophageal mucosa (biopsy-eosinophilic esophagitis), prominent Schatzki ring status post dilation with Maloney dilators.  Pale raised esophageal nodule status post biopsy (eosinophilic esophagitis). Moderate sized hiatal hernia.   . Esophagogastroduodenoscopy (egd) with esophageal dilation N/A 04/25/2013    QVZ:DGLOVFI reflux esophagitis/eosinophilic esophagitis biopsy proven/Hiatal hernia/Antral erosions. conservative esophageal dilation    There were no vitals filed for this visit.  Visit Diagnosis:  Shoulder stiffness, right  Pain in joint, shoulder region,  right  Tight fascia  Right anterior shoulder pain      Subjective Assessment - 08/26/15 1348    Subjective  S: I've been having a little more pain at night.    Currently in Pain? Yes   Pain Score 8    Pain Location Shoulder   Pain Orientation Right   Pain Descriptors / Indicators Aching;Sore   Pain Type Acute pain                      OT Treatments/Exercises (OP) - 08/26/15 1350    Exercises   Exercises Shoulder   Shoulder Exercises: Supine   Protraction PROM;5 reps;AROM;12 reps   Horizontal ABduction PROM;5 reps;AROM;12 reps   External Rotation PROM;5 reps;AROM;12 reps   Internal Rotation PROM;5 reps;AROM;12 reps   Flexion PROM;5 reps;AROM;12 reps   ABduction PROM;5 reps;AROM;12 reps   Shoulder Exercises: Standing   Protraction AROM;10 reps   Horizontal ABduction AROM;10 reps   External Rotation AROM;10 reps   Internal Rotation AROM;10 reps   Flexion AROM;10 reps   ABduction AROM;10 reps   Extension AROM;10 reps   Row AROM;10 reps   Shoulder Elevation AROM;10 reps   Shoulder Exercises: Stretch   Corner Stretch 3 reps;10 seconds   Modalities   Modalities Electrical Stimulation;Moist Heat   Moist Heat Therapy   Number Minutes Moist Heat 15 Minutes   Moist Heat Location Shoulder   Electrical Stimulation   Electrical Stimulation Location left shoulder   Electrical Stimulation Action Interferential   Electrical Stimulation Parameters 9.8 CV   Electrical Stimulation Goals Pain   Manual Therapy   Manual Therapy Myofascial release   Myofascial Release  Myofascial release and manual stretching to anterior right shoulder, clavicle region, posterior shoulder region and upper arm to decrease pain and swelling and improve pain free mobility.                 OT Education - 08/26/15 1436    Education provided Yes   Education Details scapular AROM and shoulder AROM exercises   Person(s) Educated Patient   Methods Explanation;Handout;Demonstration    Comprehension Verbalized understanding;Returned demonstration          OT Short Term Goals - 08/19/15 1433    OT SHORT TERM GOAL #1   Title Patient will be educated on HEP.   Time 4   Period Weeks   Status On-going   OT SHORT TERM GOAL #2   Title Patient will improve right shoulder PROM to WNL for increased ability to reach overhead at work and home.    Time 4   Period Weeks   Status On-going   OT SHORT TERM GOAL #3   Title Patient will improve right shoulder strength to 3+/5 for increased ability to lift horse feed.    Time 4   Period Weeks   Status On-going   OT SHORT TERM GOAL #4   Title Patient will decrease fascial restrictions and swelling to moderate in his right shoulder region.    Time 4   Period Weeks   Status On-going   OT SHORT TERM GOAL #5   Title Patient will decrease pain in his right shoulder region to 3/10 or better with activity.    Time 4   Period Weeks   Status On-going           OT Long Term Goals - 08/19/15 1436    OT LONG TERM GOAL #1   Title Patient will return to prior level of independence with all daily, work, and leisure activities.    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    Time 8   Period Weeks   Status On-going   OT LONG TERM GOAL #4   Title Patient will decrease fascial restrictions and swelling to trace in right shoulder region.   Time 8   Period Weeks   Status On-going   OT LONG TERM GOAL #5   Title Patient will decrease pain in his right shoulder region to 2/10 or better.    Time 8   Period Weeks   Status On-going               Plan - 08/26/15 1438    Clinical Impression Statement A:  Increased AROM supine repetitions to 12, added corner stretch. Continued scapular AROM exercises with OTR facilitation for scapular mobility and correct form. Provided pt with scapular and shoulder AROM HEP. Pt reports increased discomfort in shoulder at beginning and during session. ES applied at end of session for pain management.    Plan P: Follow up on AROM HEP. Cont with no pulling/pushing/lifting over 10lbs. Add scapular theraband if able to complete, and proximal shoulder strengthening in supine.  Problem List Patient Active Problem List   Diagnosis Date Noted  . GERD (gastroesophageal reflux disease) 04/10/2013  . Eosinophilic esophagitis 04/10/2013  . Esophageal dysphagia 04/10/2013    Ezra Sites, OTR/L  [phone]  08/26/2015, 4:13 PM  [facility] [address] [place], [place], [address] Phone: [phone]   Fax:  [phone]

## 2015-08-26 NOTE — Patient Instructions (Signed)
1) Shoulder Protraction    Begin with elbows by your side, slowly "punch" straight out in front of you keeping arms/elbows straight. Repeat _10__times, __2__set/day.     2) Shoulder Flexion  Supine:     Standing:         Begin with arms at your side with thumbs pointed up, slowly raise both arms up and forward towards overhead. Repeat_10__times, __2_set/day.               3) Horizontal abduction/adduction  Supine:   Standing:           Begin with arms straight out in front of you, bring out to the side in at "T" shape. Keep arms straight entire time. Repeat __10__times, _2___sets/day.                 4) Internal & External Rotation    *No band* -Stand with elbows at the side and elbows bent 90 degrees. Move your forearms away from your body, then bring back inward toward the body.  Repeat _10__times, _2__sets/day    5) Shoulder Abduction  Supine:     Standing:       Lying on your back begin with your arms flat on the table next to your side. Slowly move your arms out to the side so that they go overhead, in a jumping jack or snow angel movement. Repeat _10__times, _2__sets/day          1) Seated Row   Sit up straight with elbows by your sides. Pull back with shoulders/elbows, keeping forearms straight, as if pulling back on the reins of a horse. Squeeze shoulder blades together. Repeat _10__times, __2__sets/day    2) Shoulder Elevation    Sit up straight with arms by your sides. Slowly bring your shoulders up towards your ears. Repeat_10__times, __2__ sets/day    3) Shoulder Extension    Sit up straight with both arms by your side, draw your arms back behind your waist. Keep your elbows straight. Repeat __10__times, _2___sets/day.

## 2015-08-30 ENCOUNTER — Ambulatory Visit (HOSPITAL_COMMUNITY): Payer: 59 | Attending: Orthopedic Surgery | Admitting: Specialist

## 2015-08-30 DIAGNOSIS — M629 Disorder of muscle, unspecified: Secondary | ICD-10-CM | POA: Insufficient documentation

## 2015-08-30 DIAGNOSIS — M25611 Stiffness of right shoulder, not elsewhere classified: Secondary | ICD-10-CM | POA: Diagnosis present

## 2015-08-30 DIAGNOSIS — M25511 Pain in right shoulder: Secondary | ICD-10-CM

## 2015-08-30 DIAGNOSIS — M6289 Other specified disorders of muscle: Secondary | ICD-10-CM

## 2015-08-30 NOTE — Therapy (Signed)
[facility] [address] [place], [place], [address] Phone: [phone]   Fax:  [phone]  Occupational Therapy Treatment  Patient Details  Name: [name] MRN: [id number] Date of Birth: 1961-10-26 Referring Provider:  Kevan Rosebush Tyler-Par*  Encounter Date: 08/30/2015      OT End of Session - 08/30/15 1526    Visit Number 5   Number of Visits 16   Date for OT Re-Evaluation 10/15/15  10/17 mini reassess   Authorization Type UHC   OT Start Time 1437   OT Stop Time 1530   OT Time Calculation (min) 53 min   Activity Tolerance Patient tolerated treatment well   Behavior During Therapy [facility] for tasks assessed/performed      Past Medical History  Diagnosis Date  . Eosinophilic esophagitis 2008    Previously treated with systemic corticosteroid therapy as the patient could not afford Flovent at that time. Patient was referred to the allergist but did not go to the appointment.  . Seasonal allergies   . GERD (gastroesophageal reflux disease)   . Clavicle fracture 07/27/2015    Past Surgical History  Procedure Laterality Date  . Esophagogastroduodenoscopy  01/31/2007    HKV:[id number] food impaction/ Schatzki's ring, inflamed distal esophageal mucosa  . Esophagogastroduodenoscopy  02/14/2007    RMR: Somewhat ribbed appearing esophageal mucosa (biopsy-eosinophilic esophagitis), prominent Schatzki ring status post dilation with Maloney dilators.  Pale raised esophageal nodule status post biopsy (eosinophilic esophagitis). Moderate sized hiatal hernia.   . Esophagogastroduodenoscopy (egd) with esophageal dilation N/A 04/25/2013    IEP:PIRJJOA reflux esophagitis/eosinophilic esophagitis biopsy proven/Hiatal hernia/Antral erosions. conservative esophageal dilation    There were no vitals filed for this visit.  Visit Diagnosis:  Shoulder stiffness, right  Pain in joint, shoulder region, right  Tight fascia      Subjective  Assessment - 08/30/15 1438    Subjective  S:  Its come  along way, its getting much better.   Limitations WBAT RUE, ROM as tolerated, no lifting over 10 pounds, no pushing or pulling   Currently in Pain? Yes   Pain Score 7    Pain Location Shoulder   Pain Orientation Right   Pain Descriptors / Indicators Aching;Sore   Pain Type Acute pain            [facility] OT Assessment - 08/30/15 0001    Assessment   Diagnosis S/P Right Clavicle Fracture   Precautions   Precautions Shoulder   Type of Shoulder Precautions ROM as tolerated, WBAT, no lifting over 10 pounds, no pushing or pulling                   OT Treatments/Exercises (OP) - 08/30/15 0001    Exercises   Exercises Shoulder   Shoulder Exercises: Supine   Protraction PROM;5 reps;AROM;15 reps   Horizontal ABduction PROM;5 reps;AROM;15 reps   External Rotation PROM;5 reps;AROM;15 reps   Internal Rotation PROM;5 reps;AROM;15 reps   Flexion PROM;5 reps;AROM;15 reps   ABduction PROM;5 reps;AROM;15 reps   Shoulder Exercises: Seated   Protraction AROM;15 reps   Horizontal ABduction AROM;15 reps   External Rotation AROM;15 reps   Internal Rotation AROM;15 reps   Flexion AROM;15 reps   Abduction AROM;15 reps   Shoulder Exercises: Standing   Extension Theraband;10 reps   Theraband Level (Shoulder Extension) Level 2 (Red)   Row Theraband;15 reps   Theraband Level (Shoulder Row) Level 2 (Red)   Retraction Theraband;15 reps   Theraband Level (Shoulder Retraction)  Level 2 (Red)   Shoulder Exercises: ROM/Strengthening   Wall Wash 1'   Proximal Shoulder Strengthening, Supine 10 times without a rest break   Modalities   Modalities Electrical Stimulation;Moist Heat   Moist Heat Therapy   Number Minutes Moist Heat 15 Minutes   Moist Heat Location Shoulder   Electrical Stimulation   Electrical Stimulation Location left shoulder   Electrical Stimulation Action interferential   Electrical Stimulation Parameters 9.4 CV    Electrical Stimulation Goals Pain   Manual Therapy   Manual Therapy Myofascial release   Myofascial Release Myofascial release and manual stretching to anterior right shoulder, clavicle region, posterior shoulder region and upper arm to decrease pain and swelling and improve pain free mobility.                   OT Short Term Goals - 08/19/15 1433    OT SHORT TERM GOAL #1   Title Patient will be educated on HEP.   Time 4   Period Weeks   Status On-going   OT SHORT TERM GOAL #2   Title Patient will improve right shoulder PROM to WNL for increased ability to reach overhead at work and home.    Time 4   Period Weeks   Status On-going   OT SHORT TERM GOAL #3   Title Patient will improve right shoulder strength to 3+/5 for increased ability to lift horse feed.    Time 4   Period Weeks   Status On-going   OT SHORT TERM GOAL #4   Title Patient will decrease fascial restrictions and swelling to moderate in his right shoulder region.    Time 4   Period Weeks   Status On-going   OT SHORT TERM GOAL #5   Title Patient will decrease pain in his right shoulder region to 3/10 or better with activity.    Time 4   Period Weeks   Status On-going           OT Long Term Goals - 08/19/15 1436    OT LONG TERM GOAL #1   Title Patient will return to prior level of independence with all daily, work, and leisure activities.    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    Time 8   Period Weeks   Status On-going   OT LONG TERM GOAL #4   Title Patient will decrease fascial restrictions and swelling to trace in right shoulder region.   Time 8   Period Weeks    Status On-going   OT LONG TERM GOAL #5   Title Patient will decrease pain in his right shoulder region to 2/10 or better.    Time 8   Period Weeks   Status On-going               Plan - 08/30/15 1527    Clinical Impression Statement A:  Added wall wash and theraband for scapular stability.  patient had good pain relief from estim, therefore completd again.   Plan P:  begin supine strengthening.  Consulted and Agree with Plan of Care Patient        Problem List Patient Active Problem List   Diagnosis Date Noted  . GERD (gastroesophageal reflux disease) 04/10/2013  . Eosinophilic esophagitis 04/10/2013  . Esophageal dysphagia 04/10/2013    Shirlean Mylar, OTR/L [phone]  08/30/2015, 3:29 PM  [facility] [address] [place], [place], [address] Phone: [phone]   Fax:  [phone]

## 2015-09-02 ENCOUNTER — Encounter (HOSPITAL_COMMUNITY): Payer: Self-pay

## 2015-09-02 ENCOUNTER — Ambulatory Visit (HOSPITAL_COMMUNITY): Payer: 59

## 2015-09-02 DIAGNOSIS — M25611 Stiffness of right shoulder, not elsewhere classified: Secondary | ICD-10-CM | POA: Diagnosis not present

## 2015-09-02 DIAGNOSIS — M25511 Pain in right shoulder: Secondary | ICD-10-CM

## 2015-09-02 DIAGNOSIS — M6289 Other specified disorders of muscle: Secondary | ICD-10-CM

## 2015-09-02 DIAGNOSIS — M629 Disorder of muscle, unspecified: Secondary | ICD-10-CM

## 2015-09-02 NOTE — Therapy (Signed)
[facility] [address] [place], [place], [address] Phone: [phone]   Fax:  [phone]  Occupational Therapy Treatment  Patient Details  Name: [name] MRN: [id number] Date of Birth: 05/16/1961 Referring Provider:  Kevan Rosebush Tyler-Par*  Encounter Date: 09/02/2015      OT End of Session - 09/02/15 1652    Visit Number 6   Number of Visits 16   Date for OT Re-Evaluation 10/15/15  10/17 mini reassess   Authorization Type UHC   OT Start Time 1345   OT Stop Time 1430   OT Time Calculation (min) 45 min   Activity Tolerance Patient tolerated treatment well   Behavior During Therapy [facility] for tasks assessed/performed      Past Medical History  Diagnosis Date  . Eosinophilic esophagitis 2008    Previously treated with systemic corticosteroid therapy as the patient could not afford Flovent at that time. Patient was referred to the allergist but did not go to the appointment.  . Seasonal allergies   . GERD (gastroesophageal reflux disease)   . Clavicle fracture 07/27/2015    Past Surgical History  Procedure Laterality Date  . Esophagogastroduodenoscopy  01/31/2007    MWN:[id number] food impaction/ Schatzki's ring, inflamed distal esophageal mucosa  . Esophagogastroduodenoscopy  02/14/2007    RMR: Somewhat ribbed appearing esophageal mucosa (biopsy-eosinophilic esophagitis), prominent Schatzki ring status post dilation with Maloney dilators.  Pale raised esophageal nodule status post biopsy (eosinophilic esophagitis). Moderate sized hiatal hernia.   . Esophagogastroduodenoscopy (egd) with esophageal dilation N/A 04/25/2013    IHK:VQQVZDG reflux esophagitis/eosinophilic esophagitis biopsy proven/Hiatal hernia/Antral erosions. conservative esophageal dilation    There were no vitals filed for this visit.  Visit Diagnosis:  Shoulder stiffness, right  Pain in joint, shoulder region, right  Tight fascia      Subjective  Assessment - 09/02/15 1409    Subjective  S: I still get periods of soreness when I'm doing things at waist level.    Currently in Pain? Yes   Pain Score 5    Pain Location Shoulder   Pain Orientation Right   Pain Descriptors / Indicators Aching;Sore   Pain Type Acute pain            [facility] OT Assessment - 09/02/15 1410    Assessment   Diagnosis S/P Right Clavicle Fracture   Precautions   Precautions Shoulder   Type of Shoulder Precautions ROM as tolerated, WBAT, no lifting over 10 pounds, no pushing or pulling                   OT Treatments/Exercises (OP) - 09/02/15 1410    Exercises   Exercises Shoulder   Shoulder Exercises: Supine   Protraction PROM;5 reps;Strengthening;12 reps   Protraction Weight (lbs) 1   Horizontal ABduction PROM;5 reps;Strengthening;12 reps   Horizontal ABduction Weight (lbs) 1   External Rotation PROM;5 reps;Strengthening;12 reps   External Rotation Weight (lbs) 1   Internal Rotation PROM;5 reps;Strengthening;12 reps   Internal Rotation Weight (lbs) 1   Flexion PROM;5 reps;Strengthening;12 reps   Shoulder Flexion Weight (lbs) 1   ABduction PROM;5 reps;Strengthening;12 reps   Shoulder ABduction Weight (lbs) 1   Shoulder Exercises: Standing   Protraction Strengthening;10 reps   Protraction Weight (lbs) 1   Horizontal ABduction Strengthening;10 reps   Horizontal ABduction Weight (lbs) 1   External Rotation Strengthening;10 reps   External Rotation Weight (lbs) 1   Internal Rotation Strengthening;10 reps   Internal Rotation Weight (lbs)  1   Flexion Strengthening;10 reps   Shoulder Flexion Weight (lbs) 1   ABduction Strengthening;10 reps   Shoulder ABduction Weight (lbs) 1   Shoulder Exercises: ROM/Strengthening   X to V Arms 10X   Proximal Shoulder Strengthening, Supine 12X with 1# 1 rest break   Proximal Shoulder Strengthening, Seated 10X with no rest breaks   Manual Therapy   Manual Therapy Myofascial release   Myofascial  Release Myofascial release and manual stretching to anterior right shoulder, clavicle region, posterior shoulder region and upper arm to decrease pain and swelling and improve pain free mobility.                   OT Short Term Goals - 08/19/15 1433    OT SHORT TERM GOAL #1   Title Patient will be educated on HEP.   Time 4   Period Weeks   Status On-going   OT SHORT TERM GOAL #2   Title Patient will improve right shoulder PROM to WNL for increased ability to reach overhead at work and home.    Time 4   Period Weeks   Status On-going   OT SHORT TERM GOAL #3   Title Patient will improve right shoulder strength to 3+/5 for increased ability to lift horse feed.    Time 4   Period Weeks   Status On-going   OT SHORT TERM GOAL #4   Title Patient will decrease fascial restrictions and swelling to moderate in his right shoulder region.    Time 4   Period Weeks   Status On-going   OT SHORT TERM GOAL #5   Title Patient will decrease pain in his right shoulder region to 3/10 or better with activity.    Time 4   Period Weeks   Status On-going           OT Long Term Goals - 08/19/15 1436    OT LONG TERM GOAL #1   Title Patient will return to prior level of independence with all daily, work, and leisure activities.    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    Time 8   Period Weeks   Status On-going   OT LONG TERM GOAL #4   Title Patient will decrease fascial restrictions and swelling to trace in right shoulder region.   Time 8   Period Weeks   Status On-going   OT LONG TERM GOAL #5   Title Patient will decrease pain in his right shoulder region  to 2/10 or better.    Time 8   Period Weeks   Status On-going               Plan - 09/02/15 1652    Clinical Impression Statement A: Added 1# hand weights to supine and standing exercises. patient tolerated well with some reports of soreness and muscle fatigue.    Plan P: Add UBE bike.         Problem List Patient Active Problem List   Diagnosis Date Noted  . GERD (gastroesophageal reflux disease) 04/10/2013  .  Eosinophilic esophagitis 04/10/2013  . Esophageal dysphagia 04/10/2013   Limmie Patricia, OTR/L,CBIS  [phone]  09/02/2015, 4:54 PM  [facility] [address] [place], [place], [address] Phone: [phone]   Fax:  [phone]

## 2015-09-06 ENCOUNTER — Encounter (HOSPITAL_COMMUNITY): Payer: Self-pay | Admitting: Occupational Therapy

## 2015-09-06 ENCOUNTER — Ambulatory Visit (HOSPITAL_COMMUNITY): Payer: 59 | Admitting: Occupational Therapy

## 2015-09-06 DIAGNOSIS — M25511 Pain in right shoulder: Secondary | ICD-10-CM

## 2015-09-06 DIAGNOSIS — M25611 Stiffness of right shoulder, not elsewhere classified: Secondary | ICD-10-CM

## 2015-09-06 DIAGNOSIS — M6289 Other specified disorders of muscle: Secondary | ICD-10-CM

## 2015-09-06 DIAGNOSIS — M629 Disorder of muscle, unspecified: Secondary | ICD-10-CM

## 2015-09-06 NOTE — Therapy (Signed)
[facility] [place], [place], [address] Phone: [phone]   Fax:  [phone]  Occupational Therapy Treatment  Patient Details  Name: [name] MRN: [id number] Date of Birth: 02/03/1961 Referring Provider:  Dettinger, Fransisca Kaufmann, MD  Encounter Date: 09/06/2015      OT End of Session - 09/06/15 1228    Visit Number 7   Number of Visits 16   Date for OT Re-Evaluation 10/15/15  10/17 mini reassess   Authorization Type UHC   OT Start Time 1151   OT Stop Time 1230   OT Time Calculation (min) 39 min   Activity Tolerance Patient tolerated treatment well   Behavior During Therapy [facility] for tasks assessed/performed      Past Medical History  Diagnosis Date  . Eosinophilic esophagitis 7711    Previously treated with systemic corticosteroid therapy as the patient could not afford Flovent at that time. Patient was referred to the allergist but did not go to the appointment.  . Seasonal allergies   . GERD (gastroesophageal reflux disease)   . Clavicle fracture 07/27/2015    Past Surgical History  Procedure Laterality Date  . Esophagogastroduodenoscopy  01/31/2007    AFB:[id number] food impaction/ Schatzki's ring, inflamed distal esophageal mucosa  . Esophagogastroduodenoscopy  02/14/2007    RMR: Somewhat ribbed appearing esophageal mucosa (biopsy-eosinophilic esophagitis), prominent Schatzki ring status post dilation with Maloney dilators.  Pale raised esophageal nodule status post biopsy (eosinophilic esophagitis). Moderate sized hiatal hernia.   . Esophagogastroduodenoscopy (egd) with esophageal dilation N/A 04/25/2013    NVB:TYOMAYO reflux esophagitis/eosinophilic esophagitis biopsy proven/Hiatal hernia/Antral erosions. conservative esophageal dilation    There were no vitals filed for this visit.  Visit Diagnosis:  Shoulder stiffness, right  Pain in joint, shoulder region, right  Tight fascia      Subjective  Assessment - 09/06/15 1152    Subjective  S: It's getting easier to complete a lot of activities now.    Currently in Pain? Yes   Pain Score 4    Pain Location Shoulder   Pain Orientation Right   Pain Descriptors / Indicators Aching;Sore   Pain Type Acute pain            [facility] OT Assessment - 09/06/15 1227    Assessment   Diagnosis S/P Right Clavicle Fracture   Precautions   Precautions Shoulder   Type of Shoulder Precautions ROM as tolerated, WBAT, no lifting over 10 pounds, no pushing or pulling                   OT Treatments/Exercises (OP) - 09/06/15 1153    Exercises   Exercises Shoulder   Shoulder Exercises: Supine   Protraction PROM;5 reps;Strengthening;12 reps   Protraction Weight (lbs) 1   Horizontal ABduction PROM;5 reps;Strengthening;12 reps   Horizontal ABduction Weight (lbs) 1   External Rotation PROM;5 reps;Strengthening;12 reps   External Rotation Weight (lbs) 1   Internal Rotation PROM;5 reps;Strengthening;12 reps   Internal Rotation Weight (lbs) 1   Flexion PROM;5 reps;Strengthening;12 reps   Shoulder Flexion Weight (lbs) 1   ABduction PROM;5 reps;Strengthening;12 reps   Shoulder ABduction Weight (lbs) 1   Shoulder Exercises: Standing   Protraction Strengthening;10 reps   Protraction Weight (lbs) 1   Horizontal ABduction Strengthening;10 reps   Horizontal ABduction Weight (lbs) 1   External Rotation Strengthening;10 reps   External Rotation Weight (lbs) 1   Internal Rotation Strengthening;10 reps   Internal Rotation Weight (lbs) 1  Flexion Strengthening;10 reps   Shoulder Flexion Weight (lbs) 1   ABduction Strengthening;10 reps   Shoulder ABduction Weight (lbs) 1   Extension Theraband;10 reps   Theraband Level (Shoulder Extension) Level 2 (Red)   Row Theraband;15 reps   Theraband Level (Shoulder Row) Level 2 (Red)   Retraction Theraband;15 reps   Theraband Level (Shoulder Retraction) Level 2 (Red)   Shoulder Exercises:  ROM/Strengthening   UBE (Upper Arm Bike) Level 1, 2' forward 2' reverse   X to V Arms 10X   Proximal Shoulder Strengthening, Supine 12X with 1# 1 rest break   Proximal Shoulder Strengthening, Seated 10X with 1# weight, no rest breaks   Manual Therapy   Manual Therapy Myofascial release   Myofascial Release Myofascial release and manual stretching to anterior right shoulder, clavicle region, posterior shoulder region and upper arm to decrease pain and swelling and improve pain free mobility.                   OT Short Term Goals - 08/19/15 1433    OT SHORT TERM GOAL #1   Title Patient will be educated on HEP.   Time 4   Period Weeks   Status On-going   OT SHORT TERM GOAL #2   Title Patient will improve right shoulder PROM to WNL for increased ability to reach overhead at work and home.    Time 4   Period Weeks   Status On-going   OT SHORT TERM GOAL #3   Title Patient will improve right shoulder strength to 3+/5 for increased ability to lift horse feed.    Time 4   Period Weeks   Status On-going   OT SHORT TERM GOAL #4   Title Patient will decrease fascial restrictions and swelling to moderate in his right shoulder region.    Time 4   Period Weeks   Status On-going   OT SHORT TERM GOAL #5   Title Patient will decrease pain in his right shoulder region to 3/10 or better with activity.    Time 4   Period Weeks   Status On-going           OT Long Term Goals - 08/19/15 1436    OT LONG TERM GOAL #1   Title Patient will return to prior level of independence with all daily, work, and leisure activities.    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    Time 8   Period Weeks   Status On-going   OT LONG TERM GOAL #3   Title Patient will improve right shoulder strength to 5/5 for increased ability to lift hay bales at  work.    Time 8   Period Weeks   Status On-going   OT LONG TERM GOAL #4   Title Patient will decrease fascial restrictions and swelling to trace in right shoulder region.   Time 8   Period Weeks   Status On-going   OT LONG TERM GOAL #5   Title Patient will decrease pain in his right shoulder region to 2/10 or better.    Time 8   Period Weeks   Status On-going               Plan - 09/06/15 1234    Clinical Impression Statement A: Added UBE  this session, pt did well, cuing for speed and instructions. Pt required cuing during proximal shoulder strengthening this session.    Plan A: Add prot/ret/elev/dep, provide scapular theraband HEP.         Problem List Patient Active Problem List   Diagnosis Date Noted  . GERD (gastroesophageal reflux disease) 04/10/2013  . Eosinophilic esophagitis 95/28/4132  . Esophageal dysphagia 04/10/2013    Guadelupe Sabin, OTR/L  [phone]  09/06/2015, 12:36 PM  [facility] [address] [place], [place], [address] Phone: [phone]   Fax:  [phone]

## 2015-09-09 ENCOUNTER — Encounter (HOSPITAL_COMMUNITY): Payer: Self-pay

## 2015-09-09 ENCOUNTER — Ambulatory Visit (HOSPITAL_COMMUNITY): Payer: 59

## 2015-09-09 DIAGNOSIS — M25611 Stiffness of right shoulder, not elsewhere classified: Secondary | ICD-10-CM | POA: Diagnosis not present

## 2015-09-09 DIAGNOSIS — M629 Disorder of muscle, unspecified: Secondary | ICD-10-CM

## 2015-09-09 DIAGNOSIS — M6289 Other specified disorders of muscle: Secondary | ICD-10-CM

## 2015-09-09 DIAGNOSIS — M25511 Pain in right shoulder: Secondary | ICD-10-CM

## 2015-09-09 NOTE — Therapy (Signed)
[place], [place], [address] Phone: [phone]   Fax:  [phone]  Occupational Therapy Treatment  Patient Details  Name: [name] MRN: [id number] Date of Birth: 1961/08/19 Referring Provider:  Sallye Lat Tyler-Par*  Encounter Date: 09/09/2015      OT End of Session - 09/09/15 1512    Visit Number 8   Number of Visits 16   Date for OT Re-Evaluation 10/15/15  10/17 mini reassess   Authorization Type UHC   OT Start Time 1430   OT Stop Time 1512   OT Time Calculation (min) 42 min   Activity Tolerance Patient tolerated treatment well   Behavior During Therapy [facility] for tasks assessed/performed      Past Medical History  Diagnosis Date  . Eosinophilic esophagitis 5072    Previously treated with systemic corticosteroid therapy as the patient could not afford Flovent at that time. Patient was referred to the allergist but did not go to the appointment.  . Seasonal allergies   . GERD (gastroesophageal reflux disease)   . Clavicle fracture 07/27/2015    Past Surgical History  Procedure Laterality Date  . Esophagogastroduodenoscopy  01/31/2007    UVJ:[id number] food impaction/ Schatzki's ring, inflamed distal esophageal mucosa  . Esophagogastroduodenoscopy  02/14/2007    RMR: Somewhat ribbed appearing esophageal mucosa (biopsy-eosinophilic esophagitis), prominent Schatzki ring status post dilation with Maloney dilators.  Pale raised esophageal nodule status post biopsy (eosinophilic esophagitis). Moderate sized hiatal hernia.   . Esophagogastroduodenoscopy (egd) with esophageal dilation N/A 04/25/2013    PPG:FQMKJIZ reflux esophagitis/eosinophilic esophagitis biopsy proven/Hiatal hernia/Antral erosions. conservative esophageal dilation    There were no vitals filed for this visit.  Visit Diagnosis:  Shoulder stiffness, right  Pain in joint, shoulder region, right  Tight fascia      Subjective  Assessment - 09/09/15 1452    Subjective  S: I'm not really doing a lot of exercises with it but I am using it a lot at home and work.    Currently in Pain? Yes   Pain Score 4    Pain Location Shoulder   Pain Orientation Right   Pain Descriptors / Indicators Sore   Pain Type Acute pain            [facility] OT Assessment - 09/09/15 1453    Assessment   Diagnosis S/P Right Clavicle Fracture   Precautions   Precautions Shoulder   Type of Shoulder Precautions ROM as tolerated, WBAT, no lifting over 10 pounds, no pushing or pulling                   OT Treatments/Exercises (OP) - 09/09/15 1453    Exercises   Exercises Shoulder   Shoulder Exercises: Supine   Protraction PROM;5 reps;Strengthening;15 reps   Protraction Weight (lbs) 1   Horizontal ABduction PROM;5 reps;Strengthening;15 reps   Horizontal ABduction Weight (lbs) 1   External Rotation PROM;5 reps;Strengthening;15 reps   External Rotation Weight (lbs) 1   Internal Rotation PROM;5 reps;Strengthening;15 reps   Internal Rotation Weight (lbs) 1   Flexion PROM;5 reps;Strengthening;15 reps   Shoulder Flexion Weight (lbs) 1   ABduction PROM;5 reps;Strengthening;15 reps   Shoulder ABduction Weight (lbs) 1   Shoulder Exercises: Standing   Protraction Strengthening;15 reps   Protraction Weight (lbs) 1   Horizontal ABduction Strengthening;15 reps   Horizontal ABduction Weight (lbs) 1   External Rotation Strengthening;15 reps   External Rotation Weight (lbs) 1   Internal Rotation  Strengthening;15 reps   Internal Rotation Weight (lbs) 1   Flexion Strengthening;15 reps   Shoulder Flexion Weight (lbs) 1   ABduction Strengthening;15 reps   Shoulder ABduction Weight (lbs) 1   Shoulder Exercises: ROM/Strengthening   UBE (Upper Arm Bike) Level 1, 2' forward 2' reverse   Over Head Lace 2' with 2#   X to V Arms 15X with 1#   Proximal Shoulder Strengthening, Supine 15X with 1# 1 rest break   Proximal Shoulder  Strengthening, Seated 15X with 1# weight, no rest breaks   Prot/Ret//Elev/Dep 1'    Manual Therapy   Manual Therapy Myofascial release   Myofascial Release Myofascial release and manual stretching to anterior right shoulder, clavicle region, posterior shoulder region and upper arm to decrease pain and swelling and improve pain free mobility.                 OT Education - 09/09/15 1512    Education provided Yes   Education Details Discussed completing HEP at home or at work. using 1 lb/water bottle/coup can for added weight.   Person(s) Educated Patient   Methods Explanation   Comprehension Verbalized understanding          OT Short Term Goals - 08/19/15 1433    OT SHORT TERM GOAL #1   Title Patient will be educated on HEP.   Time 4   Period Weeks   Status On-going   OT SHORT TERM GOAL #2   Title Patient will improve right shoulder PROM to WNL for increased ability to reach overhead at work and home.    Time 4   Period Weeks   Status On-going   OT SHORT TERM GOAL #3   Title Patient will improve right shoulder strength to 3+/5 for increased ability to lift horse feed.    Time 4   Period Weeks   Status On-going   OT SHORT TERM GOAL #4   Title Patient will decrease fascial restrictions and swelling to moderate in his right shoulder region.    Time 4   Period Weeks   Status On-going   OT SHORT TERM GOAL #5   Title Patient will decrease pain in his right shoulder region to 3/10 or better with activity.    Time 4   Period Weeks   Status On-going           OT Long Term Goals - 08/19/15 1436    OT LONG TERM GOAL #1   Title Patient will return to prior level of independence with all daily, work, and leisure activities.    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    Time 8   Period Weeks   Status On-going   OT LONG TERM GOAL #4   Title Patient will decrease fascial restrictions and swelling to trace in right shoulder region.   Time 8   Period Weeks   Status On-going   OT LONG TERM GOAL #5   Title Patient will decrease pain in his right shoulder region to 2/10 or better.    Time  8   Period Weeks   Status On-going               Plan - 09/09/15 1513    Clinical Impression Statement A: Added overhead lacing and pro/ret/elev/dep with cues needed for form and technique.    Plan A: Mini reassessment. Add ball on the wall        Problem List Patient Active Problem List   Diagnosis Date Noted  . GERD (gastroesophageal reflux disease) 04/10/2013  . Eosinophilic esophagitis 21/22/4825  . Esophageal dysphagia 04/10/2013    Ailene Ravel, OTR/L,CBIS  [phone]  09/09/2015, 3:15 PM  [place] [address] [place], [place], [address] Phone: [phone]   Fax:  [phone]

## 2015-09-13 ENCOUNTER — Ambulatory Visit (HOSPITAL_COMMUNITY): Payer: 59 | Admitting: Specialist

## 2015-09-13 DIAGNOSIS — M6289 Other specified disorders of muscle: Secondary | ICD-10-CM

## 2015-09-13 DIAGNOSIS — M25611 Stiffness of right shoulder, not elsewhere classified: Secondary | ICD-10-CM | POA: Diagnosis not present

## 2015-09-13 DIAGNOSIS — M629 Disorder of muscle, unspecified: Secondary | ICD-10-CM

## 2015-09-13 DIAGNOSIS — M25511 Pain in right shoulder: Secondary | ICD-10-CM

## 2015-09-13 NOTE — Therapy (Signed)
[facility] [address] [place], [place], [address] Phone: [phone]   Fax:  [phone]  Occupational Therapy Treatment  Patient Details  Name: Otilio SaberRicky L Zajicek MRN: [id number] Date of Birth: Dec 01, 1960 Referring Provider: Judene CompanionHolly Tyler-Paris Pilson, MD  Encounter Date: 09/13/2015      OT End of Session - 09/13/15 1557    Visit Number 9   Number of Visits 16   Date for OT Re-Evaluation 10/15/15   Authorization Type UHC   OT Start Time 1430   OT Stop Time 1520   OT Time Calculation (min) 50 min   Activity Tolerance Patient tolerated treatment well   Behavior [facility] Therapy [facility] for tasks assessed/performed      Past Medical History  Diagnosis Date  . Eosinophilic esophagitis 2008    Previously treated with systemic corticosteroid therapy as the patient could not afford Flovent at that time. Patient was referred to the allergist but did not go to the appointment.  . Seasonal allergies   . GERD (gastroesophageal reflux disease)   . Clavicle fracture 07/27/2015    Past Surgical History  Procedure Laterality Date  . Esophagogastroduodenoscopy  01/31/2007    OAC:[id number]:Esophageal food impaction/ Schatzki's ring, inflamed distal esophageal mucosa  . Esophagogastroduodenoscopy  02/14/2007    RMR: Somewhat ribbed appearing esophageal mucosa (biopsy-eosinophilic esophagitis), prominent Schatzki ring status post dilation with Maloney dilators.  Pale raised esophageal nodule status post biopsy (eosinophilic esophagitis). Moderate sized hiatal hernia.   . Esophagogastroduodenoscopy (egd) with esophageal dilation N/A 04/25/2013    FUX:NATFTDDRMR:Erosive reflux esophagitis/eosinophilic esophagitis biopsy proven/Hiatal hernia/Antral erosions. conservative esophageal dilation    There were no vitals filed for this visit.  Visit Diagnosis:  Shoulder stiffness, right  Pain in joint, shoulder region, right  Tight fascia      Subjective Assessment - 09/13/15  1437    Subjective  S:  I can do alot without it irritating it.  I still cant lift anything heavy.  I can use it alot better.   Limitations WBAT RUE, ROM as tolerated, no lifting over 10 pounds, no pushing or pulling   Currently in Pain? 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    Observation/Other Assessments   Focus on Therapeutic Outcomes (FOTO)  70/100   Palpation   Palpation comment min fascial restrictions in right shoulder and clavicle region   AROM   Overall AROM Comments assessed in supine ER/IR with shoulder adducted (08/15/05)   AROM Assessment Site Shoulder   Right/Left Shoulder Right   Right Shoulder Flexion 145 Degrees  45   Right Shoulder ABduction 145 Degrees  60   Right Shoulder Internal Rotation 82 Degrees  82   Right Shoulder External Rotation 75 Degrees  60  PROM   Overall PROM Comments assessed in supine ER/IR with shoulder adducted  (08/16/15)   PROM Assessment Site Shoulder   Right/Left Shoulder Right   Right Shoulder Flexion 160 Degrees  127   Right Shoulder ABduction 170 Degrees  117   Right Shoulder Internal Rotation 90 Degrees  82   Right Shoulder External Rotation 90 Degrees  82   Strength   Overall Strength Comments assessed in seated ER/IR with shoulder adducted   Right Shoulder Flexion 4-/5   Right Shoulder  ABduction 4-/5   Right Shoulder Internal Rotation 4-/5   Right Shoulder External Rotation 4-/5                  OT Treatments/Exercises (OP) - 09/13/15 0001    Exercises   Exercises Shoulder   Shoulder Exercises: Supine   Protraction PROM;5 reps   Horizontal ABduction PROM;5 reps   External Rotation PROM;5 reps   Internal Rotation PROM;5 reps   Flexion PROM;5 reps   ABduction PROM;5 reps   Shoulder Exercises: ROM/Strengthening   UBE (Upper Arm Bike) level 1 3' forward and 3' reverse   Wall Wash 2' with 2# y   Manual Therapy   Manual Therapy Myofascial release   Myofascial Release Myofascial release and manual stretching to anterior right shoulder, clavicle region, posterior shoulder region and upper arm to decrease pain and swelling and improve pain free mobility.                 OT Education - 09/13/15 1557    Education Details discussed attempting to play guitar with strap on left shoulder, patient to complete prior to returning for next visit          OT Short Term Goals - 09/13/15 1458    OT SHORT TERM GOAL #1   Title Patient will be educated on HEP.   Time 4   Period Weeks   Status On-going   OT SHORT TERM GOAL #2   Title Patient will improve right shoulder PROM to WNL for increased ability to reach overhead at work and home.    Time 4   Period Weeks   Status Achieved   OT SHORT TERM GOAL #3   Title Patient will improve right shoulder strength to 3+/5 for increased ability to lift horse feed.    Time 4   Period Weeks   Status Achieved   OT SHORT TERM GOAL #4   Title Patient will decrease fascial restrictions and swelling to moderate in his right shoulder region.    Time 4   Period Weeks   Status Achieved   OT SHORT TERM GOAL #5   Title Patient will decrease pain in his right shoulder region to 3/10 or better with activity.    Time 4   Period Weeks   Status On-going           OT Long Term Goals - 09/13/15 1500    OT LONG TERM GOAL  #1   Title Patient will return to prior level of independence with all daily, work, and leisure activities.    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    Time 8   Period Weeks   Status On-going   OT LONG TERM GOAL #4   Title Patient will decrease fascial restrictions and swelling to trace in right shoulder region.   Time 8   Period Weeks   Status On-going   OT LONG TERM GOAL #5   Title Patient will decrease pain in his right shoulder region to 2/10 or better.    Time 8   Period Weeks   Status On-going               Plan - 09/13/15 1558    Clinical Impression Statement A:  Patient has made significant improvements in P/ROM and A/ROM from initial evaluation. Patient continues to have difficulty with strenuous activities and lifting, has not resumed bowling and wishes to do so.    Plan P:  Focus on sustained activity tolerance with RUE, end range A/ROM, and begin gentle strengthening in order for patient to return to prior level of independence with functional activities at work and home. Add ball on wall, prone exercises resume missed exercises.   Consulted and Agree with Plan of Care Patient        Problem List Patient Active Problem List   Diagnosis Date Noted  . GERD (gastroesophageal reflux disease) 04/10/2013  . Eosinophilic esophagitis 04/10/2013  . Esophageal dysphagia 04/10/2013    Shirlean Mylar, OTR/L [phone]  09/13/2015, 4:01 PM  [facility] [address] [place], [place], [address] Phone: [phone]   Fax:  [phone]  Name: DAREION KNEECE MRN: [id number] Date of Birth:  [birth year]

## 2015-09-16 ENCOUNTER — Ambulatory Visit (HOSPITAL_COMMUNITY): Payer: 59 | Admitting: Occupational Therapy

## 2015-09-16 ENCOUNTER — Encounter (HOSPITAL_COMMUNITY): Payer: Self-pay | Admitting: Occupational Therapy

## 2015-09-16 DIAGNOSIS — M25611 Stiffness of right shoulder, not elsewhere classified: Secondary | ICD-10-CM

## 2015-09-16 DIAGNOSIS — M629 Disorder of muscle, unspecified: Secondary | ICD-10-CM

## 2015-09-16 DIAGNOSIS — M6289 Other specified disorders of muscle: Secondary | ICD-10-CM

## 2015-09-16 DIAGNOSIS — M25511 Pain in right shoulder: Secondary | ICD-10-CM

## 2015-09-16 NOTE — Therapy (Signed)
[facility] [address] [place], [place], [address] Phone: [phone]   Fax:  [phone]  Occupational Therapy Treatment  Patient Details  Name: [name] MRN: [id number] Date of Birth: 07-10-1961 Referring Provider: Judene Companion, MD  Encounter Date: 09/16/2015      OT End of Session - 09/16/15 1626    Visit Number 10   Number of Visits 16   Date for OT Re-Evaluation 10/15/15   Authorization Type UHC   OT Start Time 1440   OT Stop Time 1517   OT Time Calculation (min) 37 min   Activity Tolerance Patient tolerated treatment well   Behavior During Therapy [facility] for tasks assessed/performed      Past Medical History  Diagnosis Date  . Eosinophilic esophagitis 2008    Previously treated with systemic corticosteroid therapy as the patient could not afford Flovent at that time. Patient was referred to the allergist but did not go to the appointment.  . Seasonal allergies   . GERD (gastroesophageal reflux disease)   . Clavicle fracture 07/27/2015    Past Surgical History  Procedure Laterality Date  . Esophagogastroduodenoscopy  01/31/2007    ONG:[id number] food impaction/ Schatzki's ring, inflamed distal esophageal mucosa  . Esophagogastroduodenoscopy  02/14/2007    RMR: Somewhat ribbed appearing esophageal mucosa (biopsy-eosinophilic esophagitis), prominent Schatzki ring status post dilation with Maloney dilators.  Pale raised esophageal nodule status post biopsy (eosinophilic esophagitis). Moderate sized hiatal hernia.   . Esophagogastroduodenoscopy (egd) with esophageal dilation N/A 04/25/2013    MWN:UUVOZDG reflux esophagitis/eosinophilic esophagitis biopsy proven/Hiatal hernia/Antral erosions. conservative esophageal dilation    There were no vitals filed for this visit.  Visit Diagnosis:  Shoulder stiffness, right  Pain in joint, shoulder region, right  Tight fascia      Subjective Assessment - 09/16/15  1441    Subjective  S: It's kinda come down to still being sensitive to movements.    Currently in Pain? No/denies            [facility] OT Assessment - 09/16/15 1442    Assessment   Diagnosis S/P Right Clavicle Fracture   Precautions   Precautions Shoulder   Type of Shoulder Precautions ROM as tolerated, WBAT, no lifting over 10 pounds, no pushing or pulling                   OT Treatments/Exercises (OP) - 09/16/15 1442    Exercises   Exercises Shoulder   Shoulder Exercises: Supine   Protraction PROM;5 reps;Strengthening;15 reps   Protraction Weight (lbs) 1   Horizontal ABduction PROM;5 reps;Strengthening;15 reps   Horizontal ABduction Weight (lbs) 1   External Rotation PROM;5 reps;Strengthening;15 reps   External Rotation Weight (lbs) 1   Internal Rotation PROM;5 reps;Strengthening;15 reps   Internal Rotation Weight (lbs) 1   Flexion PROM;5 reps;Strengthening;15 reps   Shoulder Flexion Weight (lbs) 1   ABduction PROM;5 reps;Strengthening;15 reps   Shoulder ABduction Weight (lbs) 1   Shoulder Exercises: Prone   Other Prone Exercises [place]ston exercises, 5 positions, 10X each   Shoulder Exercises: Standing   Protraction Strengthening;15 reps   Protraction Weight (lbs) 1   Horizontal ABduction Strengthening;15 reps   Horizontal ABduction Weight (lbs) 1   External Rotation Strengthening;15 reps   External Rotation Weight (lbs) 1   Internal Rotation Strengthening;15 reps   Internal Rotation Weight (lbs) 1   Flexion Strengthening;15 reps   Shoulder Flexion Weight (lbs) 1   ABduction Strengthening;15 reps  Shoulder ABduction Weight (lbs) 1   Shoulder Exercises: ROM/Strengthening   X to V Arms 15X with 1#   Proximal Shoulder Strengthening, Supine 15X with 1# no rest break   Ball on Wall 1' flexion and abduction   Manual Therapy   Manual Therapy Myofascial release   Myofascial Release Myofascial release and manual stretching to anterior right shoulder, clavicle  region, posterior shoulder region and upper arm to decrease pain and swelling and improve pain free mobility.                   OT Short Term Goals - 09/13/15 1458    OT SHORT TERM GOAL #1   Title Patient will be educated on HEP.   Time 4   Period Weeks   Status On-going   OT SHORT TERM GOAL #2   Title Patient will improve right shoulder PROM to WNL for increased ability to reach overhead at work and home.    Time 4   Period Weeks   Status Achieved   OT SHORT TERM GOAL #3   Title Patient will improve right shoulder strength to 3+/5 for increased ability to lift horse feed.    Time 4   Period Weeks   Status Achieved   OT SHORT TERM GOAL #4   Title Patient will decrease fascial restrictions and swelling to moderate in his right shoulder region.    Time 4   Period Weeks   Status Achieved   OT SHORT TERM GOAL #5   Title Patient will decrease pain in his right shoulder region to 3/10 or better with activity.    Time 4   Period Weeks   Status On-going           OT Long Term Goals - 09/13/15 1500    OT LONG TERM GOAL #1   Title Patient will return to prior level of independence with all daily, work, and leisure activities.    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    Time 8   Period Weeks   Status On-going   OT LONG TERM GOAL #4   Title Patient will decrease fascial restrictions and swelling to trace in right shoulder region.   Time 8   Period Weeks   Status On-going   OT LONG TERM GOAL #5   Title Patient will decrease pain in his right shoulder region to 2/10 or better.    Time 8   Period Weeks   Status On-going                Plan - 09/16/15 1626    Clinical Impression Statement A: Added ball on wall, prone hughston exercises, resumed missed strengthening exercises this session. Pt reports minimal soreness at end of session.    Plan P: Add lawnmower theraband exercise, attempt 2# in supine.         Problem List Patient Active Problem List   Diagnosis Date Noted  . GERD (gastroesophageal reflux disease) 04/10/2013  . Eosinophilic esophagitis 04/10/2013  . Esophageal dysphagia 04/10/2013    Ezra SitesLeslie Troxler,  OTR/L  [phone]  09/16/2015, 4:28 PM  [facility] [address] [place], [place], [address] Phone: [phone]   Fax:  [phone]  Name: [name] MRN: [id number] Date of Birth: Dec 04, 1960

## 2015-09-20 ENCOUNTER — Encounter (HOSPITAL_COMMUNITY): Payer: Self-pay

## 2015-09-20 ENCOUNTER — Ambulatory Visit (HOSPITAL_COMMUNITY): Payer: 59

## 2015-09-20 DIAGNOSIS — M629 Disorder of muscle, unspecified: Secondary | ICD-10-CM

## 2015-09-20 DIAGNOSIS — M25611 Stiffness of right shoulder, not elsewhere classified: Secondary | ICD-10-CM | POA: Diagnosis not present

## 2015-09-20 DIAGNOSIS — M25511 Pain in right shoulder: Secondary | ICD-10-CM

## 2015-09-20 DIAGNOSIS — M6289 Other specified disorders of muscle: Secondary | ICD-10-CM

## 2015-09-20 NOTE — Therapy (Signed)
[facility] [address] [place], [place], [address] Phone: [phone]   Fax:  [phone]  Occupational Therapy Treatment  Patient Details  Name: [name] MRN: [id number] Date of Birth: March 02, 1961 Referring Provider: Judene Companion, MD  Encounter Date: 09/20/2015      OT End of Session - 09/20/15 1644    Visit Number 11   Number of Visits 16   Date for OT Re-Evaluation 10/15/15   Authorization Type UHC   OT Start Time 1435   OT Stop Time 1515   OT Time Calculation (min) 40 min   Activity Tolerance Patient tolerated treatment well   Behavior During Therapy [facility] for tasks assessed/performed      Past Medical History  Diagnosis Date  . Eosinophilic esophagitis 2008    Previously treated with systemic corticosteroid therapy as the patient could not afford Flovent at that time. Patient was referred to the allergist but did not go to the appointment.  . Seasonal allergies   . GERD (gastroesophageal reflux disease)   . Clavicle fracture 07/27/2015    Past Surgical History  Procedure Laterality Date  . Esophagogastroduodenoscopy  01/31/2007    XLK:[id number] food impaction/ Schatzki's ring, inflamed distal esophageal mucosa  . Esophagogastroduodenoscopy  02/14/2007    RMR: Somewhat ribbed appearing esophageal mucosa (biopsy-eosinophilic esophagitis), prominent Schatzki ring status post dilation with Maloney dilators.  Pale raised esophageal nodule status post biopsy (eosinophilic esophagitis). Moderate sized hiatal hernia.   . Esophagogastroduodenoscopy (egd) with esophageal dilation N/A 04/25/2013    GUY:QIHKVQQ reflux esophagitis/eosinophilic esophagitis biopsy proven/Hiatal hernia/Antral erosions. conservative esophageal dilation    There were no vitals filed for this visit.  Visit Diagnosis:  Shoulder stiffness, right  Pain in joint, shoulder region, right  Tight fascia  Right anterior shoulder pain       Subjective Assessment - 09/20/15 1642    Subjective  S: I still tell that something's going on in there.    Limitations WBAT RUE, ROM as tolerated, no lifting over 10 pounds, no pushing or pulling   Currently in Pain? Yes   Pain Score 3    Pain Location Shoulder   Pain Orientation Right   Pain Descriptors / Indicators Sore   Pain Type Acute pain                      OT Treatments/Exercises (OP) - 09/20/15 1451    Exercises   Exercises Shoulder   Shoulder Exercises: Supine   Protraction PROM;5 reps;Strengthening;12 reps   Protraction Weight (lbs) 2   Horizontal ABduction PROM;5 reps;Strengthening;12 reps   Horizontal ABduction Weight (lbs) 2   External Rotation PROM;5 reps;Strengthening;12 reps   External Rotation Weight (lbs) 2   Internal Rotation PROM;5 reps;Strengthening;12 reps   Internal Rotation Weight (lbs) 2   Flexion PROM;5 reps;Strengthening;12 reps   Shoulder Flexion Weight (lbs) 2   ABduction PROM;5 reps;Strengthening;12 reps   Shoulder ABduction Weight (lbs) 2   Shoulder Exercises: Standing   Protraction Strengthening;12 reps   Protraction Weight (lbs) 2   Horizontal ABduction Strengthening;12 reps   Horizontal ABduction Weight (lbs) 2   External Rotation Strengthening;12 reps   External Rotation Weight (lbs) 2   Internal Rotation Strengthening;12 reps   Internal Rotation Weight (lbs) 2   Flexion Strengthening;12 reps   Shoulder Flexion Weight (lbs) 2   ABduction Strengthening;12 reps   Shoulder ABduction Weight (lbs) 2   Extension Theraband;12 reps   Theraband Level (Shoulder Extension) Level  2 (Red)   Row Theraband;12 reps   Theraband Level (Shoulder Row) Level 2 (Red)   Retraction Theraband;12 reps   Theraband Level (Shoulder Retraction) Level 2 (Red)   Other Standing Exercises lawnmower; red theraband; 12X   Shoulder Exercises: ROM/Strengthening   UBE (Upper Arm Bike) level 1 3' forward and 3' reverse   X to V Arms 12X with 2#    Proximal Shoulder Strengthening, Supine 12X with 2#   Proximal Shoulder Strengthening, Seated 12X with 2#    Ball on Wall 1' flexion and abduction   Manual Therapy   Manual Therapy Myofascial release   Myofascial Release Myofascial release and manual stretching to anterior right shoulder, clavicle region, posterior shoulder region and upper arm to decrease pain and swelling and improve pain free mobility.                   OT Short Term Goals - 09/20/15 1702    OT SHORT TERM GOAL #1   Title Patient will be educated on HEP.   Time 4   Period Weeks   Status On-going   OT SHORT TERM GOAL #2   Title Patient will improve right shoulder PROM to WNL for increased ability to reach overhead at work and home.    Time 4   Period Weeks   OT SHORT TERM GOAL #3   Title Patient will improve right shoulder strength to 3+/5 for increased ability to lift horse feed.    Time 4   Period Weeks   OT SHORT TERM GOAL #4   Title Patient will decrease fascial restrictions and swelling to moderate in his right shoulder region.    Time 4   Period Weeks   OT SHORT TERM GOAL #5   Title Patient will decrease pain in his right shoulder region to 3/10 or better with activity.    Time 4   Period Weeks   Status On-going           OT Long Term Goals - 09/13/15 1500    OT LONG TERM GOAL #1   Title Patient will return to prior level of independence with all daily, work, and leisure activities.    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    Time 8   Period Weeks   Status On-going   OT LONG TERM GOAL #4   Title Patient will decrease fascial restrictions and swelling to  trace in right shoulder region.   Time 8   Period Weeks   Status On-going   OT LONG TERM GOAL #5   Title Patient will decrease pain in his right shoulder region to 2/10 or better.    Time 8   Period Weeks   Status On-going               Plan - 09/20/15 1701    Clinical Impression Statement A: Added lawnmower theraband exercise and 2# weight supine and standing. patient needed min VC for form and technique.    Plan P: Attempt some weightbearing activities such as planks (start with modified  plank on knees).        Problem List Patient Active Problem List   Diagnosis Date Noted  . GERD (gastroesophageal reflux disease) 04/10/2013  . Eosinophilic esophagitis 04/10/2013  . Esophageal dysphagia 04/10/2013    Limmie Patricia, OTR/L,CBIS  [phone]  09/20/2015, 5:05 PM  [place] [address] [place], [place], [address] Phone: [phone]   Fax:  [phone]  Name: [name] MRN: [id number] Date of Birth: 1961/04/22

## 2015-09-23 ENCOUNTER — Ambulatory Visit (HOSPITAL_COMMUNITY): Payer: 59 | Admitting: Occupational Therapy

## 2015-09-23 ENCOUNTER — Encounter (HOSPITAL_COMMUNITY): Payer: Self-pay | Admitting: Occupational Therapy

## 2015-09-23 DIAGNOSIS — M25611 Stiffness of right shoulder, not elsewhere classified: Secondary | ICD-10-CM | POA: Diagnosis not present

## 2015-09-23 DIAGNOSIS — M25511 Pain in right shoulder: Secondary | ICD-10-CM

## 2015-09-23 DIAGNOSIS — M6289 Other specified disorders of muscle: Secondary | ICD-10-CM

## 2015-09-23 DIAGNOSIS — M629 Disorder of muscle, unspecified: Secondary | ICD-10-CM

## 2015-09-23 NOTE — Therapy (Signed)
[facility] [address] [place], [place], [address] Phone: [phone]   Fax:  [phone]  Occupational Therapy Treatment  Patient Details  Name: [name] MRN: [id number] Date of Birth: [birth year] Referring Provider: Judene CompanionHolly Tyler-Paris Pilson, MD  Encounter Date: 09/23/2015      OT End of Session - 09/23/15 1520    Visit Number 12   Number of Visits 16   Date for OT Re-Evaluation 10/15/15   Authorization Type UHC   OT Start Time 1434   OT Stop Time 1515   OT Time Calculation (min) 41 min   Activity Tolerance Patient tolerated treatment well   Behavior [facility] for tasks assessed/performed      Past Medical History  Diagnosis Date  . Eosinophilic esophagitis 2008    Previously treated with systemic corticosteroid therapy as the patient could not afford Flovent at that time. Patient was referred to the allergist but did not go to the appointment.  . Seasonal allergies   . GERD (gastroesophageal reflux disease)   . Clavicle fracture 07/27/2015    Past Surgical History  Procedure Laterality Date  . Esophagogastroduodenoscopy  01/31/2007    QIH:[id number]:Esophageal food impaction/ Schatzki's ring, inflamed distal esophageal mucosa  . Esophagogastroduodenoscopy  02/14/2007    RMR: Somewhat ribbed appearing esophageal mucosa (biopsy-eosinophilic esophagitis), prominent Schatzki ring status post dilation with Maloney dilators.  Pale raised esophageal nodule status post biopsy (eosinophilic esophagitis). Moderate sized hiatal hernia.   . Esophagogastroduodenoscopy (egd) with esophageal dilation N/A 04/25/2013    VFI:EPPIRJJRMR:Erosive reflux esophagitis/eosinophilic esophagitis biopsy proven/Hiatal hernia/Antral erosions. conservative esophageal dilation    There were no vitals filed for this visit.  Visit Diagnosis:  Shoulder stiffness, right  Pain in joint, shoulder region, right  Tight fascia      Subjective Assessment - 09/23/15  1436    Subjective  S: I'm right around the corner with my check up with the doctor.    Currently in Pain? Yes   Pain Score 2    Pain Location Shoulder   Pain Orientation Right   Pain Descriptors / Indicators Sore   Pain Type Acute pain            [facility] OT Assessment - 09/23/15 1520    Assessment   Diagnosis S/P Right Clavicle Fracture   Precautions   Precautions Shoulder   Type of Shoulder Precautions ROM as tolerated, WBAT, no lifting over 10 pounds, no pushing or pulling                   OT Treatments/Exercises (OP) - 09/23/15 1437    Exercises   Exercises Shoulder   Shoulder Exercises: Supine   Protraction Strengthening   Protraction Weight (lbs) 2   Horizontal ABduction PROM;5 reps;Strengthening;12 reps   Horizontal ABduction Weight (lbs) 2   External Rotation PROM;5 reps;Strengthening;12 reps   External Rotation Weight (lbs) 2   Internal Rotation PROM;5 reps;Strengthening;12 reps   Internal Rotation Weight (lbs) 2   Flexion PROM;5 reps;Strengthening;12 reps   Shoulder Flexion Weight (lbs) 2   ABduction PROM;5 reps;Strengthening;12 reps   Shoulder ABduction Weight (lbs) 2   Shoulder Exercises: Prone   Other Prone Exercises Hughston exercises, 5 positions, 12X each, 1#   Shoulder Exercises: Standing   Protraction Strengthening;12 reps   Protraction Weight (lbs) 2   Horizontal ABduction Strengthening;12 reps   Horizontal ABduction Weight (lbs) 2   External Rotation Strengthening;12 reps   External Rotation Weight (lbs) 2  Internal Rotation Strengthening;12 reps   Internal Rotation Weight (lbs) 2   Flexion Strengthening;12 reps   Shoulder Flexion Weight (lbs) 2   ABduction Strengthening;12 reps   Shoulder ABduction Weight (lbs) 2   Extension Theraband;12 reps   Theraband Level (Shoulder Extension) Level 3 (Green)   Row [employer] reps   Theraband Level (Shoulder Row) Level 3 (Green)   Retraction Theraband;12 reps   Theraband Level (Shoulder  Retraction) Level 3 (Green)   Shoulder Exercises: ROM/Strengthening   Wall Pushups Other (comment)  12 reps   Modified Plank 30 seconds;1 rep   X to V Arms 12X with 2#   Proximal Shoulder Strengthening, Supine 12X with 2#   Proximal Shoulder Strengthening, Seated 12X with 2#    Ball on Wall 1' flexion and abduction   Manual Therapy   Manual Therapy Myofascial release   Myofascial Release Myofascial release and manual stretching to anterior right shoulder, clavicle region, posterior shoulder region and upper arm to decrease pain and swelling and improve pain free mobility.                   OT Short Term Goals - 09/20/15 1702    OT SHORT TERM GOAL #1   Title Patient will be educated on HEP.   Time 4   Period Weeks   Status On-going   OT SHORT TERM GOAL #2   Title Patient will improve right shoulder PROM to WNL for increased ability to reach overhead at work and home.    Time 4   Period Weeks   OT SHORT TERM GOAL #3   Title Patient will improve right shoulder strength to 3+/5 for increased ability to lift horse feed.    Time 4   Period Weeks   OT SHORT TERM GOAL #4   Title Patient will decrease fascial restrictions and swelling to moderate in his right shoulder region.    Time 4   Period Weeks   OT SHORT TERM GOAL #5   Title Patient will decrease pain in his right shoulder region to 3/10 or better with activity.    Time 4   Period Weeks   Status On-going           OT Long Term Goals - 09/13/15 1500    OT LONG TERM GOAL #1   Title Patient will return to prior level of independence with all daily, work, and leisure activities.    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    Time 8   Period Weeks   Status On-going   OT LONG TERM GOAL #4   Title Patient will decrease fascial restrictions and swelling to trace in right shoulder region.   Time 8   Period Weeks   Status On-going   OT LONG TERM GOAL #5   Title Patient will decrease pain in his right shoulder region to 2/10 or better.    Time 8   Period Weeks   Status On-going               Plan - 09/23/15  1520    Clinical Impression Statement A: Added wall pushups and modified plank exercise. Added 1# to Hughston exercises. Pt experienced mod fatigue at end of session, required min verbal cuing for form during exercises.    Plan P: Add cybex row/press, increase strengthening repetitions to 15 in supine.         Problem List Patient Active Problem List   Diagnosis Date Noted  . GERD (gastroesophageal reflux disease) 04/10/2013  . Eosinophilic esophagitis 04/10/2013  . Esophageal dysphagia 04/10/2013   Ezra Sites, OTR/L  [phone]  09/23/2015, 3:22 PM  [facility] [address] [place], [place], [address] Phone: [phone]   Fax:  [phone]  Name: [name] MRN: [id number] Date of Birth: April 30, 1961

## 2015-09-27 ENCOUNTER — Ambulatory Visit (HOSPITAL_COMMUNITY): Payer: 59 | Admitting: Specialist

## 2015-09-27 DIAGNOSIS — M6289 Other specified disorders of muscle: Secondary | ICD-10-CM

## 2015-09-27 DIAGNOSIS — M629 Disorder of muscle, unspecified: Secondary | ICD-10-CM

## 2015-09-27 DIAGNOSIS — M25611 Stiffness of right shoulder, not elsewhere classified: Secondary | ICD-10-CM

## 2015-09-27 NOTE — Therapy (Signed)
[place], [place], [address] Phone: [phone]   Fax:  [phone]  Occupational Therapy Treatment  Patient Details  Name: [name] MRN: [id number] Date of Birth: 01-30-1961 Referring Provider: Rosiland Oz, MD  Encounter Date: 09/27/2015      OT End of Session - 09/27/15 1615    Visit Number 13   Number of Visits 16   Date for OT Re-Evaluation 10/15/15   Authorization Type UHC   OT Start Time 4259   OT Stop Time 1435   OT Time Calculation (min) 40 min   Activity Tolerance Patient tolerated treatment well   Behavior During Therapy [facility] for tasks assessed/performed      Past Medical History  Diagnosis Date  . Eosinophilic esophagitis 5638    Previously treated with systemic corticosteroid therapy as the patient could not afford Flovent at that time. Patient was referred to the allergist but did not go to the appointment.  . Seasonal allergies   . GERD (gastroesophageal reflux disease)   . Clavicle fracture 07/27/2015    Past Surgical History  Procedure Laterality Date  . Esophagogastroduodenoscopy  01/31/2007    VFI:EPPIRJJOAC food impaction/ Schatzki's ring, inflamed distal esophageal mucosa  . Esophagogastroduodenoscopy  02/14/2007    RMR: Somewhat ribbed appearing esophageal mucosa (biopsy-eosinophilic esophagitis), prominent Schatzki ring status post dilation with Maloney dilators.  Pale raised esophageal nodule status post biopsy (eosinophilic esophagitis). Moderate sized hiatal hernia.   . Esophagogastroduodenoscopy (egd) with esophageal dilation N/A 04/25/2013    ZYS:AYTKZSW reflux esophagitis/eosinophilic esophagitis biopsy proven/Hiatal hernia/Antral erosions. conservative esophageal dilation    There were no vitals filed for this visit.  Visit Diagnosis:  Shoulder stiffness, right  Tight fascia      Subjective Assessment - 09/27/15 1358    Subjective  S:  If I over do it  I can feel it.   Currently in Pain? 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   AROM   Overall AROM Comments assessed in seated (ER/IR with shoulder abducted)   Right Shoulder Flexion 162 Degrees  145   Right Shoulder ABduction 160 Degrees  145   Right Shoulder Internal Rotation 65 Degrees   abducted 82 adducted   Right Shoulder External Rotation 75 Degrees  abdcucted 75 adducted   PROM   Overall PROM Comments assessed in supine [facility]   Strength   Overall Strength Comments assessed in seated ER/IR with shoulder adducted   Right Shoulder Flexion 4+/5   Right Shoulder ABduction 4+/5   Right Shoulder Internal Rotation 4+/5   Right Shoulder External Rotation 4/5                  OT Treatments/Exercises (OP) - 09/27/15 0001    Exercises   Exercises Shoulder   Shoulder Exercises: Supine  Protraction PROM;5 reps;Strengthening;15 reps   Protraction Weight (lbs) 2   Horizontal ABduction PROM;5 reps;Strengthening;15 reps   Horizontal ABduction Weight (lbs) 2   External Rotation PROM;5 reps;Strengthening;15 reps   External Rotation Weight (lbs) 2   Internal Rotation PROM;5 reps;Strengthening;15 reps   Internal Rotation Weight (lbs) 2   Flexion PROM;5 reps;Strengthening;15 reps   Shoulder Flexion Weight (lbs) 2   ABduction PROM;5 reps;Strengthening;15 reps   Shoulder ABduction Weight (lbs) 2   Shoulder Exercises: Standing   Protraction Strengthening;15 reps    Protraction Weight (lbs) 2   Horizontal ABduction Strengthening;15 reps   Horizontal ABduction Weight (lbs) 2   External Rotation Strengthening;15 reps   External Rotation Weight (lbs) 2   Internal Rotation Strengthening;15 reps   Internal Rotation Weight (lbs) 2   Flexion Strengthening;15 reps   Shoulder Flexion Weight (lbs) 2   ABduction Strengthening;15 reps   Shoulder ABduction Weight (lbs) 2   Shoulder Exercises: ROM/Strengthening   UBE (Upper Arm Bike) level 2 3' forward and 3' reverse   Cybex Press 1.5 plate;10 reps   Cybex Row 1.5 plate;15 reps   Wall Pushups 10 reps   Modified Plank 45 seconds;1 rep   Proximal Shoulder Strengthening, Supine 15 X with 2#   Proximal Shoulder Strengthening, Seated 15 X with 2#   Manual Therapy   Manual Therapy Myofascial release   Myofascial Release Myofascial release and manual stretching to anterior right shoulder, clavicle region, posterior shoulder region and upper arm to decrease pain and swelling and improve pain free mobility.                   OT Short Term Goals - 09/27/15 1418    OT SHORT TERM GOAL #1   Title Patient will be educated on HEP.   Time 4   Period Weeks   Status Achieved   OT SHORT TERM GOAL #2   Title Patient will improve right shoulder PROM to WNL for increased ability to reach overhead at work and home.    Time 4   Period Weeks   Status Achieved   OT SHORT TERM GOAL #3   Title Patient will improve right shoulder strength to 3+/5 for increased ability to lift horse feed.    Time 4   Period Weeks   Status Achieved   OT SHORT TERM GOAL #4   Title Patient will decrease fascial restrictions and swelling to moderate in his right shoulder region.    Time 4   Period Weeks   Status Achieved   OT SHORT TERM GOAL #5   Title Patient will decrease pain in his right shoulder region to 3/10 or better with activity.    Time 4   Period Weeks   Status Achieved           OT Long Term Goals - 09/27/15 1418     OT LONG TERM GOAL #1   Title Patient will return to prior level of independence with all daily, work, and leisure activities.    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  Time 8   Period Weeks   Status On-going   OT LONG TERM GOAL #4   Title Patient will decrease fascial restrictions and swelling to trace in right shoulder region.   Time 8   Period Weeks   Status Achieved   OT LONG TERM GOAL #5   Title Patient will decrease pain in his right shoulder region to 2/10 or better.    Time 8   Period Weeks   Status Achieved               Plan - 09/27/15 1615    Clinical Impression Statement A:  A/ROM has improved as has strength of right shoulder.  Fascial restrictions are minimal to trace.  Patient has met all short term goals and is working towards long term goals.    Plan P:  Treatment to continue two times a week for 2 weeks.  D/C MFR to PRN.  Focus on end range A/ROM and strengthening so that patient can return to all duties at work.  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    Consulted and Agree with Plan of Care Patient        Problem List Patient Active Problem List   Diagnosis Date Noted  . GERD (gastroesophageal reflux disease) 04/10/2013  . Eosinophilic esophagitis 94/49/6759  . Esophageal dysphagia 04/10/2013    Vangie Bicker, OTR/L [phone]  09/27/2015, 4:18 PM  [place] [address] [place], [place], [address] Phone: [phone]   Fax:  [phone]  Name: [name] MRN: [id number] Date of Birth: Oct 10, 1961

## 2015-09-30 ENCOUNTER — Ambulatory Visit (HOSPITAL_COMMUNITY): Payer: 59 | Attending: Orthopedic Surgery

## 2015-09-30 DIAGNOSIS — M629 Disorder of muscle, unspecified: Secondary | ICD-10-CM | POA: Diagnosis present

## 2015-09-30 DIAGNOSIS — R29898 Other symptoms and signs involving the musculoskeletal system: Secondary | ICD-10-CM | POA: Insufficient documentation

## 2015-09-30 DIAGNOSIS — M25611 Stiffness of right shoulder, not elsewhere classified: Secondary | ICD-10-CM | POA: Diagnosis not present

## 2015-09-30 DIAGNOSIS — M25511 Pain in right shoulder: Secondary | ICD-10-CM | POA: Diagnosis present

## 2015-09-30 DIAGNOSIS — M6289 Other specified disorders of muscle: Secondary | ICD-10-CM

## 2015-09-30 NOTE — Therapy (Signed)
[place] [facility] [address] [place], [place], [address] Phone: [phone]   Fax:  [phone]  Occupational Therapy Treatment  Patient Details  Name: [name] MRN: [id number] Date of Birth: July 21, 1961 Referring Provider: Judene Companion, MD  Encounter Date: 09/30/2015      OT End of Session - 09/30/15 1343    Visit Number 14   Number of Visits 16   Date for OT Re-Evaluation 10/15/15   Authorization Type UHC   OT Start Time 1300   OT Stop Time 1345   OT Time Calculation (min) 45 min   Activity Tolerance Patient tolerated treatment well   Behavior During Therapy [facility] for tasks assessed/performed      Past Medical History  Diagnosis Date  . Eosinophilic esophagitis 2008    Previously treated with systemic corticosteroid therapy as the patient could not afford Flovent at that time. Patient was referred to the allergist but did not go to the appointment.  . Seasonal allergies   . GERD (gastroesophageal reflux disease)   . Clavicle fracture 07/27/2015    Past Surgical History  Procedure Laterality Date  . Esophagogastroduodenoscopy  01/31/2007    XLK:GMWNUUVOZD food impaction/ Schatzki's ring, inflamed distal esophageal mucosa  . Esophagogastroduodenoscopy  02/14/2007    RMR: Somewhat ribbed appearing esophageal mucosa (biopsy-eosinophilic esophagitis), prominent Schatzki ring status post dilation with Maloney dilators.  Pale raised esophageal nodule status post biopsy (eosinophilic esophagitis). Moderate sized hiatal hernia.   . Esophagogastroduodenoscopy (egd) with esophageal dilation N/A 04/25/2013    GUY:QIHKVQQ reflux esophagitis/eosinophilic esophagitis biopsy proven/Hiatal hernia/Antral erosions. conservative esophageal dilation    There were no vitals filed for this visit.  Visit Diagnosis:  Shoulder stiffness, right  Tight fascia  Pain in joint, shoulder region, right      Subjective Assessment - 09/30/15  1315    Subjective  S: I went to see Dr. Hilda Lias. He wants to see me again in 3 weeks.    Limitations WBAT RUE, ROM as tolerated, no lifting over 10 pounds, no pushing or pulling   Currently in Pain? No/denies                      OT Treatments/Exercises (OP) - 09/30/15 1318    Exercises   Exercises Shoulder   Shoulder Exercises: Standing   Protraction Strengthening;15 reps   Protraction Weight (lbs) 2   Horizontal ABduction Strengthening;15 reps   Horizontal ABduction Weight (lbs) 2   External Rotation Strengthening;15 reps   External Rotation Weight (lbs) 2   Internal Rotation Strengthening;15 reps   Internal Rotation Weight (lbs) 2   Flexion Strengthening;15 reps   Shoulder Flexion Weight (lbs) 2   ABduction Strengthening;15 reps   Shoulder ABduction Weight (lbs) 2   Shoulder Exercises: ROM/Strengthening   UBE (Upper Arm Bike) level 2 3' forward and 3' reverse   X to V Arms 15X with 2#   Proximal Shoulder Strengthening, Supine 15 X with 2#   Proximal Shoulder Strengthening, Seated 15 X with 2#   Other ROM/Strengthening Exercises Pt simulated bowling with 5# and 8# handweight; pt completed 5-10X using RUE.                  OT Short Term Goals - 09/30/15 1418    OT SHORT TERM GOAL #1   Title Patient will be educated on HEP.   Time 4   Period Weeks   OT SHORT TERM GOAL #2   Title Patient  will improve right shoulder PROM to WNL for increased ability to reach overhead at work and home.    Time 4   Period Weeks   OT SHORT TERM GOAL #3   Title Patient will improve right shoulder strength to 3+/5 for increased ability to lift horse feed.    Time 4   Period Weeks   OT SHORT TERM GOAL #4   Title Patient will decrease fascial restrictions and swelling to moderate in his right shoulder region.    Time 4   Period Weeks   OT SHORT TERM GOAL #5   Title Patient will decrease pain in his right shoulder region to 3/10 or better with activity.    Time 4    Period Weeks           OT Long Term Goals - 09/30/15 1418    OT LONG TERM GOAL #1   Title Patient will return to prior level of independence with all daily, work, and leisure activities.    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    Time 8   Period Weeks   Status On-going   OT LONG TERM GOAL #4   Title Patient will decrease fascial restrictions and swelling to trace in right shoulder region.   Time 8   Period Weeks   OT LONG TERM GOAL #5   Title Patient will decrease pain in his right shoulder region to 2/10 or better.    Time 8   Period Weeks               Plan - 09/30/15 1343    Clinical Impression Statement A: Simulated bowling task this date using 5# and 8# handweight. Patient reports feeling sensitive in his clavicle although it may be more psychological as it doesn't feel normal yet and he's more hesitant to use it.    Plan P: Continue to work on progressively add weight to bowling simulation task.        Problem List Patient Active Problem List   Diagnosis Date Noted  . GERD (gastroesophageal reflux disease) 04/10/2013  . Eosinophilic esophagitis 04/10/2013  . Esophageal dysphagia 04/10/2013    Limmie PatriciaLaura Kaylan Friedmann, OTR/L,CBIS  [phone]  09/30/2015, 2:19 PM  [place] [facility] [address] [place], [place], [address] Phone: [phone]   Fax:  [phone]  Name: [name] MRN: [id number] Date of Birth: 1961/03/23

## 2015-10-04 ENCOUNTER — Ambulatory Visit (HOSPITAL_COMMUNITY): Payer: 59

## 2015-10-04 DIAGNOSIS — M25611 Stiffness of right shoulder, not elsewhere classified: Secondary | ICD-10-CM | POA: Diagnosis not present

## 2015-10-04 DIAGNOSIS — M25511 Pain in right shoulder: Secondary | ICD-10-CM

## 2015-10-04 NOTE — Therapy (Signed)
[facility] [address] , [place], [address] Phone: [phone]   Fax:  [phone]  Occupational Therapy Treatment  Patient Details  Name: [name] MRN: [id number] Date of Birth: November 15, 1961 Referring Provider: Judene CompanionHolly Tyler-Paris Pilson, MD  Encounter Date: 10/04/2015      OT End of Session - 10/04/15 1447    Visit Number 15   Number of Visits 18   Date for OT Re-Evaluation 10/15/15   Authorization Type UHC   OT Start Time 1345   OT Stop Time 1430   OT Time Calculation (min) 45 min   Activity Tolerance Patient tolerated treatment well   Behavior During Therapy [facility] for tasks assessed/performed      Past Medical History  Diagnosis Date  . Eosinophilic esophagitis 2008    Previously treated with systemic corticosteroid therapy as the patient could not afford Flovent at that time. Patient was referred to the allergist but did not go to the appointment.  . Seasonal allergies   . GERD (gastroesophageal reflux disease)   . Clavicle fracture 07/27/2015    Past Surgical History  Procedure Laterality Date  . Esophagogastroduodenoscopy  01/31/2007    UXL:KGMWNUUVOZRMR:Esophageal food impaction/ Schatzki's ring, inflamed distal esophageal mucosa  . Esophagogastroduodenoscopy  02/14/2007    RMR: Somewhat ribbed appearing esophageal mucosa (biopsy-eosinophilic esophagitis), prominent Schatzki ring status post dilation with Maloney dilators.  Pale raised esophageal nodule status post biopsy (eosinophilic esophagitis). Moderate sized hiatal hernia.   . Esophagogastroduodenoscopy (egd) with esophageal dilation N/A 04/25/2013    DGU:YQIHKVQRMR:Erosive reflux esophagitis/eosinophilic esophagitis biopsy proven/Hiatal hernia/Antral erosions. conservative esophageal dilation    There were no vitals filed for this visit.  Visit Diagnosis:  Shoulder stiffness, right  Pain in joint, shoulder region, right      Subjective Assessment - 10/04/15 1425     Subjective  S: I went bowling on Friday and I got 2 strikes. I didn't use my right arm though.    Currently in Pain? No/denies                      OT Treatments/Exercises (OP) - 10/04/15 0001    Exercises   Exercises Shoulder   Shoulder Exercises: Supine   Protraction PROM;5 reps;Strengthening;10 reps   Protraction Weight (lbs) 3   Horizontal ABduction PROM;5 reps;Strengthening;10 reps   Horizontal ABduction Weight (lbs) 3   External Rotation PROM;5 reps;Strengthening;10 reps   External Rotation Weight (lbs) 3   Internal Rotation PROM;5 reps;Strengthening;10 reps   Internal Rotation Weight (lbs) 3   Flexion PROM;5 reps;Strengthening;10 reps   Shoulder Flexion Weight (lbs) 3   ABduction PROM;5 reps;Strengthening;10 reps   Shoulder ABduction Weight (lbs) 3   Shoulder Exercises: Prone   Other Prone Exercises Hughston exercises, 5 positions, 12X each, 2#   Shoulder Exercises: ROM/Strengthening   UBE (Upper Arm Bike) level 2 3' forward and 3' reverse   Cybex Press 1.5 plate;15 reps   Cybex Row 1.5 plate;15 reps   X to V Arms 12X with 3#   Pendulum -   Proximal Shoulder Strengthening, Supine 12X with 3#   Other ROM/Strengthening Exercises Pt simulated bowling with 8# and 10# (1X) handweight; pt completed 5-10X using RUE.                  OT Short Term Goals - 09/30/15 1418    OT SHORT TERM GOAL #1   Title Patient will be educated on HEP.   Time 4  Period Weeks   OT SHORT TERM GOAL #2   Title Patient will improve right shoulder PROM to WNL for increased ability to reach overhead at work and home.    Time 4   Period Weeks   OT SHORT TERM GOAL #3   Title Patient will improve right shoulder strength to 3+/5 for increased ability to lift horse feed.    Time 4   Period Weeks   OT SHORT TERM GOAL #4   Title Patient will decrease fascial restrictions and swelling to moderate in his right shoulder region.    Time 4   Period Weeks   OT SHORT TERM GOAL #5    Title Patient will decrease pain in his right shoulder region to 3/10 or better with activity.    Time 4   Period Weeks           OT Long Term Goals - 09/30/15 1418    OT LONG TERM GOAL #1   Title Patient will return to prior level of independence with all daily, work, and leisure activities.    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    Time 8   Period Weeks   Status On-going   OT LONG TERM GOAL #4   Title Patient will decrease fascial restrictions and swelling to trace in right shoulder region.   Time 8   Period Weeks   OT LONG TERM GOAL #5   Title Patient will decrease pain in his right shoulder region to 2/10 or better.    Time 8   Period Weeks               Plan - 10/04/15 1448    Clinical Impression Statement A: Pt held 10# hand weight in right hand this date and simualted bowling 1 trial. Pt reports that he was scared to use his right arm when he was bowling on Friday.    Plan P: Continue to work on progressively adding weight to bowling simulation task.        Problem List Patient Active Problem List   Diagnosis Date Noted  . GERD (gastroesophageal reflux disease) 04/10/2013  . Eosinophilic esophagitis 04/10/2013  . Esophageal dysphagia 04/10/2013     [name], OTR/L,CBIS  [phone]  10/04/2015, 2:50 PM  [place] [facility] [address] [place], [place], [address] Phone: [phone]   Fax:  [phone]  Name: JUD FANGUY MRN: [id number] Date of Birth: [birth year]

## 2015-10-07 ENCOUNTER — Encounter (HOSPITAL_COMMUNITY): Payer: Self-pay | Admitting: Occupational Therapy

## 2015-10-07 ENCOUNTER — Ambulatory Visit (HOSPITAL_COMMUNITY): Payer: 59 | Admitting: Occupational Therapy

## 2015-10-07 DIAGNOSIS — M25611 Stiffness of right shoulder, not elsewhere classified: Secondary | ICD-10-CM

## 2015-10-07 DIAGNOSIS — M25511 Pain in right shoulder: Secondary | ICD-10-CM

## 2015-10-07 DIAGNOSIS — M629 Disorder of muscle, unspecified: Secondary | ICD-10-CM

## 2015-10-07 DIAGNOSIS — M6289 Other specified disorders of muscle: Secondary | ICD-10-CM

## 2015-10-07 NOTE — Therapy (Signed)
[place] [facility] [address] [place], [place], [address] Phone: [phone]   Fax:  [phone]  Occupational Therapy Treatment  Patient Details  Name: [name] MRN: [id number] Date of Birth: 24-Mar-1961 Referring Provider: Judene Companion, MD  Encounter Date: 10/07/2015      OT End of Session - 10/07/15 1317    Visit Number 16   Number of Visits 18   Date for OT Re-Evaluation 10/15/15   Authorization Type UHC   OT Start Time 1302   OT Stop Time 1345   OT Time Calculation (min) 43 min   Activity Tolerance Patient tolerated treatment well   Behavior During Therapy [facility] for tasks assessed/performed      Past Medical History  Diagnosis Date  . Eosinophilic esophagitis 2008    Previously treated with systemic corticosteroid therapy as the patient could not afford Flovent at that time. Patient was referred to the allergist but did not go to the appointment.  . Seasonal allergies   . GERD (gastroesophageal reflux disease)   . Clavicle fracture 07/27/2015    Past Surgical History  Procedure Laterality Date  . Esophagogastroduodenoscopy  01/31/2007    ONG:EXBMWUXLKG food impaction/ Schatzki's ring, inflamed distal esophageal mucosa  . Esophagogastroduodenoscopy  02/14/2007    RMR: Somewhat ribbed appearing esophageal mucosa (biopsy-eosinophilic esophagitis), prominent Schatzki ring status post dilation with Maloney dilators.  Pale raised esophageal nodule status post biopsy (eosinophilic esophagitis). Moderate sized hiatal hernia.   . Esophagogastroduodenoscopy (egd) with esophageal dilation N/A 04/25/2013    MWN:UUVOZDG reflux esophagitis/eosinophilic esophagitis biopsy proven/Hiatal hernia/Antral erosions. conservative esophageal dilation    There were no vitals filed for this visit.  Visit Diagnosis:  Shoulder stiffness, right  Pain in joint, shoulder region, right  Tight fascia      Subjective Assessment - 10/07/15  1306    Subjective  S:    Currently in Pain? Yes   Pain Score 2    Pain Location Shoulder   Pain Orientation Right   Pain Descriptors / Indicators Sore   Pain Type Acute pain            [facility] OT Assessment - 10/07/15 1316    Assessment   Diagnosis S/P Right Clavicle Fracture   Precautions   Precautions Shoulder   Type of Shoulder Precautions ROM as tolerated, WBAT, no lifting over 10 pounds, no pushing or pulling                   OT Treatments/Exercises (OP) - 10/07/15 1307    Exercises   Exercises Shoulder   Shoulder Exercises: Supine   Protraction PROM;5 reps;Strengthening;12 reps   Protraction Weight (lbs) 3   Horizontal ABduction PROM;5 reps;Strengthening;12 reps   Horizontal ABduction Weight (lbs) 3   External Rotation PROM;5 reps;Strengthening;12 reps   External Rotation Weight (lbs) 3   Internal Rotation PROM;5 reps;Strengthening;12 reps   Internal Rotation Weight (lbs) 3   Flexion PROM;5 reps;Strengthening;12 reps   Shoulder Flexion Weight (lbs) 3   ABduction PROM;5 reps;Strengthening;12 reps   Shoulder ABduction Weight (lbs) 3   Shoulder Exercises: Prone   Other Prone Exercises Hughston exercises, 5 positions, 12X each, 3#   Shoulder Exercises: Standing   Protraction Strengthening;10 reps   Protraction Weight (lbs) 3   Horizontal ABduction Strengthening;10 reps   Horizontal ABduction Weight (lbs) 3   External Rotation Strengthening;10 reps   External Rotation Weight (lbs) 3   External Rotation Limitations --   Internal Rotation Strengthening;10 reps  Internal Rotation Weight (lbs) 3   Flexion Strengthening;10 reps   Shoulder Flexion Weight (lbs) 3   ABduction Strengthening;10 reps   Shoulder ABduction Weight (lbs) 3   Other Standing Exercises standing field goal, 10X 3# weight   Shoulder Exercises: ROM/Strengthening   UBE (Upper Arm Bike) level 2 3' forward and 3' reverse   Cybex Press 2 plate;15 reps   Cybex Row 2 plate;15 reps   X to  V Arms 12X with 3#   Proximal Shoulder Strengthening, Supine 12X with 3#   Other ROM/Strengthening Exercises Pt simulated bowling with 8# and 10# handweight; pt completed 10X with 8# weight, 5X with 10# weight using RUE.                  OT Short Term Goals - 09/30/15 1418    OT SHORT TERM GOAL #1   Title Patient will be educated on HEP.   Time 4   Period Weeks   OT SHORT TERM GOAL #2   Title Patient will improve right shoulder PROM to WNL for increased ability to reach overhead at work and home.    Time 4   Period Weeks   OT SHORT TERM GOAL #3   Title Patient will improve right shoulder strength to 3+/5 for increased ability to lift horse feed.    Time 4   Period Weeks   OT SHORT TERM GOAL #4   Title Patient will decrease fascial restrictions and swelling to moderate in his right shoulder region.    Time 4   Period Weeks   OT SHORT TERM GOAL #5   Title Patient will decrease pain in his right shoulder region to 3/10 or better with activity.    Time 4   Period Weeks           OT Long Term Goals - 09/30/15 1418    OT LONG TERM GOAL #1   Title Patient will return to prior level of independence with all daily, work, and leisure activities.    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    Time 8   Period Weeks   Status On-going   OT LONG TERM GOAL #4   Title Patient will decrease fascial restrictions and swelling to trace in right shoulder region.   Time 8   Period Weeks   OT LONG TERM GOAL #5   Title Patient will decrease pain in his right shoulder region to 2/10 or better.    Time 8   Period Weeks               Plan - 10/07/15  1341    Clinical Impression Statement A: Completed bowling simulation task with 10# 5X this session, added standing field goal, added 3# weight to hughston exercises. Pt required min verbal cuing for form.    Plan P: Attempt to complete bowling simulation with 10# weight 10X. Continue strengthening exercises, add face down field goal.         Problem List Patient Active Problem List   Diagnosis Date Noted  . GERD (  gastroesophageal reflux disease) 04/10/2013  . Eosinophilic esophagitis 04/10/2013  . Esophageal dysphagia 04/10/2013    Ezra Sites, OTR/L  [phone]  10/07/2015, 2:09 PM  [place] [facility] [address] [place], [place], [address] Phone: [phone]   Fax:  [phone]  Name: [name] MRN: [id number] Date of Birth: [birth year]

## 2015-10-13 ENCOUNTER — Ambulatory Visit (HOSPITAL_COMMUNITY): Payer: 59 | Admitting: Specialist

## 2015-10-13 DIAGNOSIS — M25611 Stiffness of right shoulder, not elsewhere classified: Secondary | ICD-10-CM | POA: Diagnosis not present

## 2015-10-13 DIAGNOSIS — M25511 Pain in right shoulder: Secondary | ICD-10-CM

## 2015-10-13 NOTE — Therapy (Signed)
[facility] [address] [place], [place], [address] Phone: [phone]   Fax:  [phone]  Occupational Therapy Treatment  Patient Details  Name: [name] MRN: [id number] Date of Birth: [birth year] Referring Provider: Judene Companion, MD  Encounter Date: 10/13/2015      OT End of Session - 10/13/15 1358    Visit Number 17   Number of Visits 18   Date for OT Re-Evaluation 10/15/15   Authorization Type UHC   Authorization Time Period n/a   OT Start Time 1306   OT Stop Time 1350   OT Time Calculation (min) 44 min   Activity Tolerance Patient tolerated treatment well   Behavior During Therapy [facility] for tasks assessed/performed      Past Medical History  Diagnosis Date  . Eosinophilic esophagitis 2008    Previously treated with systemic corticosteroid therapy as the patient could not afford Flovent at that time. Patient was referred to the allergist but did not go to the appointment.  . Seasonal allergies   . GERD (gastroesophageal reflux disease)   . Clavicle fracture 07/27/2015    Past Surgical History  Procedure Laterality Date  . Esophagogastroduodenoscopy  01/31/2007    ONG:[id number] food impaction/ Schatzki's ring, inflamed distal esophageal mucosa  . Esophagogastroduodenoscopy  02/14/2007    RMR: Somewhat ribbed appearing esophageal mucosa (biopsy-eosinophilic esophagitis), prominent Schatzki ring status post dilation with Maloney dilators.  Pale raised esophageal nodule status post biopsy (eosinophilic esophagitis). Moderate sized hiatal hernia.   . Esophagogastroduodenoscopy (egd) with esophageal dilation N/A 04/25/2013    MWN:UUVOZDG reflux esophagitis/eosinophilic esophagitis biopsy proven/Hiatal hernia/Antral erosions. conservative esophageal dilation    There were no vitals filed for this visit.  Visit Diagnosis:  Pain in joint, shoulder region, right      Subjective Assessment - 10/13/15 1308    Subjective  S:  I am basically back to doing everything I was doing before I got hurt at work.   Limitations WBAT RUE, ROM as tolerated, no lifting over 10 pounds, no pushing or pulling   Currently in Pain? No/denies            [facility] OT Assessment - 10/13/15 0001    Assessment   Diagnosis S/P Right Clavicle Fracture   Precautions   Precautions Shoulder   Type of Shoulder Precautions ROM as tolerated, WBAT, no lifting over 10 pounds, no pushing or pulling                   OT Treatments/Exercises (OP) - 10/13/15 0001    Exercises   Exercises Shoulder   Shoulder Exercises: Supine   Protraction Strengthening;15 reps   Protraction Weight (lbs) 3   Horizontal ABduction Strengthening;15 reps   Horizontal ABduction Weight (lbs) 3   External Rotation Strengthening;15 reps   External Rotation Weight (lbs) 3   Internal Rotation Strengthening;15 reps   Internal Rotation Weight (lbs) 3   Flexion Strengthening;15 reps   Shoulder Flexion Weight (lbs) 3   ABduction Strengthening;15 reps   Shoulder ABduction Weight (lbs) 3   Other Supine Exercises serratus anterior punch 15 times with 3#   Shoulder Exercises: Prone   Other Prone Exercises face down field goal without resistance 10 times mod vg for technique   Other Prone Exercises [place] exercises, 5 positions, 12X each, 3#   Shoulder Exercises: Standing   Other Standing Exercises overhead press, upward row, and external rotation at 0 abd and then outstretch arms at end range  Shoulder Exercises: ROM/Strengthening   Other ROM/Strengthening Exercises Pt simulated bowling with 10# handweight; pt completed 10X with 10# weight using RUE.   Other ROM/Strengthening Exercises therapy ball held between outstretched arms, completed 15 repetitions of overhead press, chest press, overhead v, large arm circles to the right and to the left, squat touch the ball to ground and then power up to overhead    Shoulder Exercises: Body Blade    Flexion 5 reps   ABduction 5 reps   External Rotation 5 reps   Manual Therapy   Manual Therapy Myofascial release   Myofascial Release Myofascial release and manual stretching to anterior right shoulder, clavicle region, posterior shoulder region and upper arm to decrease pain and swelling and improve pain free mobility.                 OT Education - 10/13/15 1357    Education provided Yes   Education Details bowl with right arm on Friday night, let pain be guide   Person(s) Educated Patient   Methods Explanation;Demonstration   Comprehension Verbalized understanding          OT Short Term Goals - 09/30/15 1418    OT SHORT TERM GOAL #1   Title Patient will be educated on HEP.   Time 4   Period Weeks   OT SHORT TERM GOAL #2   Title Patient will improve right shoulder PROM to WNL for increased ability to reach overhead at work and home.    Time 4   Period Weeks   OT SHORT TERM GOAL #3   Title Patient will improve right shoulder strength to 3+/5 for increased ability to lift horse feed.    Time 4   Period Weeks   OT SHORT TERM GOAL #4   Title Patient will decrease fascial restrictions and swelling to moderate in his right shoulder region.    Time 4   Period Weeks   OT SHORT TERM GOAL #5   Title Patient will decrease pain in his right shoulder region to 3/10 or better with activity.    Time 4   Period Weeks           OT Long Term Goals - 09/30/15 1418    OT LONG TERM GOAL #1   Title Patient will return to prior level of independence with all daily, work, and leisure activities.    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    Time 8    Period Weeks   Status On-going   OT LONG TERM GOAL #4   Title Patient will decrease fascial restrictions and swelling to trace in right shoulder region.   Time 8   Period Weeks   OT LONG TERM GOAL #5   Title Patient will decrease pain in his right shoulder region to 2/10 or better.    Time 8   Period Weeks               Plan - 10/13/15 1358    Clinical Impression Statement A:  added several strengthening, sustained activity exercises that  patient was able to complete with good form with min cues for technique.     Plan P:  Reassess for possible dc.        Problem List Patient Active Problem List   Diagnosis Date Noted  . GERD (gastroesophageal reflux disease) 04/10/2013  . Eosinophilic esophagitis 04/10/2013  . Esophageal dysphagia 04/10/2013    Shirlean MylarBethany H. Murray, OTR/L [phone]  10/13/2015, 2:00 PM  [facility] [address] [place], [place], [address] Phone: [phone]   Fax:  [phone]  Name: [name] MRN: [id number] Date of Birth: 30-May-1961

## 2015-10-14 ENCOUNTER — Ambulatory Visit (HOSPITAL_COMMUNITY): Payer: 59

## 2015-10-14 ENCOUNTER — Encounter (HOSPITAL_COMMUNITY): Payer: Self-pay

## 2015-10-14 DIAGNOSIS — R29898 Other symptoms and signs involving the musculoskeletal system: Secondary | ICD-10-CM

## 2015-10-14 DIAGNOSIS — M25611 Stiffness of right shoulder, not elsewhere classified: Secondary | ICD-10-CM | POA: Diagnosis not present

## 2015-10-14 NOTE — Therapy (Signed)
[place], [place], [address] Phone: [phone]   Fax:  [phone]  Occupational Therapy Treatment and reassessment  Patient Details  Name: [name] MRN: [id number] Date of Birth: Feb 02, 1961 Referring Provider: Rosiland Oz, MD  Encounter Date: 10/14/2015      OT End of Session - 10/14/15 1339    Visit Number 18   Number of Visits 18   Date for OT Re-Evaluation 10/15/15   Authorization Type UHC   Authorization Time Period n/a   OT Start Time 1300   OT Stop Time 1345   OT Time Calculation (min) 45 min   Activity Tolerance Patient tolerated treatment well   Behavior During Therapy [facility] for tasks assessed/performed      Past Medical History  Diagnosis Date  . Eosinophilic esophagitis 1062    Previously treated with systemic corticosteroid therapy as the patient could not afford Flovent at that time. Patient was referred to the allergist but did not go to the appointment.  . Seasonal allergies   . GERD (gastroesophageal reflux disease)   . Clavicle fracture 07/27/2015    Past Surgical History  Procedure Laterality Date  . Esophagogastroduodenoscopy  01/31/2007    IRS:WNIOEVOJJK food impaction/ Schatzki's ring, inflamed distal esophageal mucosa  . Esophagogastroduodenoscopy  02/14/2007    RMR: Somewhat ribbed appearing esophageal mucosa (biopsy-eosinophilic esophagitis), prominent Schatzki ring status post dilation with Maloney dilators.  Pale raised esophageal nodule status post biopsy (eosinophilic esophagitis). Moderate sized hiatal hernia.   . Esophagogastroduodenoscopy (egd) with esophageal dilation N/A 04/25/2013    KXF:GHWEXHB reflux esophagitis/eosinophilic esophagitis biopsy proven/Hiatal hernia/Antral erosions. conservative esophageal dilation    There were no vitals filed for this visit.  Visit Diagnosis:  Shoulder weakness      Subjective Assessment - 10/14/15 1332    Subjective  S: I see Dr. Luna Glasgow I think next week so I'll probably start working after Thanksgiving.    Special Tests FOTO score: 83/100   Currently in Pain? No/denies            [facility] OT Assessment - 10/14/15 1309    Assessment   Diagnosis S/P Right Clavicle Fracture   Precautions   Precautions Shoulder   Type of Shoulder Precautions ROM as tolerated, WBAT, no lifting over 10 pounds, no pushing or pulling    AROM   Overall AROM Comments assessed in seated (er/IR with shoulder abducted)   AROM Assessment Site Shoulder   Right/Left Shoulder Right   Right Shoulder Flexion 175 Degrees  previous: 162   Right Shoulder ABduction 180 Degrees  previous: 160   Right Shoulder Internal Rotation 90 Degrees  previous: 65   Right Shoulder External Rotation 90 Degrees  previous: 75   Strength   Overall Strength Comments assessed in seated er/IR with shoulder adducted   Strength Assessment Site Shoulder   Right/Left Shoulder Right   Right Shoulder Flexion 5/5  previous: 4+/5   Right Shoulder ABduction 5/5  previous: 4+/5   Right Shoulder Internal Rotation 5/5  previous: 4+/5   Right Shoulder External Rotation 5/5  previous: 4/5                  OT Treatments/Exercises (OP) - 10/14/15 1334    Exercises   Exercises Shoulder   Shoulder Exercises: Standing   Horizontal ABduction Theraband;12 reps   Theraband Level (Shoulder Horizontal ABduction) Level 3 (Green)   External Rotation Theraband;12 reps   Theraband Level (Shoulder External Rotation) Level  3 (Green)   Internal Rotation Theraband;12 reps   Theraband Level (Shoulder Internal Rotation) Level 3 (Green)   ABduction Theraband;12 reps   Theraband Level (Shoulder ABduction) Level 3 Nyoka Cowden)                OT Education - 10/14/15 1338    Education provided Yes   Education Details Green theraband shoulder strengthening.    Person(s) Educated Patient   Methods Explanation;Demonstration;Handout    Comprehension Returned demonstration;Verbalized understanding          OT Short Term Goals - 10/14/15 1315    OT SHORT TERM GOAL #1   Title Patient will be educated on HEP.   Time 4   Period Weeks   OT SHORT TERM GOAL #2   Title Patient will improve right shoulder PROM to WNL for increased ability to reach overhead at work and home.    Time 4   Period Weeks   OT SHORT TERM GOAL #3   Title Patient will improve right shoulder strength to 3+/5 for increased ability to lift horse feed.    Time 4   Period Weeks   OT SHORT TERM GOAL #4   Title Patient will decrease fascial restrictions and swelling to moderate in his right shoulder region.    Time 4   Period Weeks   OT SHORT TERM GOAL #5   Title Patient will decrease pain in his right shoulder region to 3/10 or better with activity.    Time 4   Period Weeks           OT Long Term Goals - 10/14/15 1315    OT LONG TERM GOAL #1   Title Patient will return to prior level of independence with all daily, work, and leisure activities.    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    Time 8   Period Weeks   Status Achieved   OT LONG TERM GOAL #4   Title Patient will decrease fascial restrictions and swelling to trace in right shoulder region.   Time 8   Period Weeks   OT LONG TERM GOAL #5   Title Patient will decrease pain in his right shoulder region to 2/10 or better.    Time 8   Period Weeks               Plan - 10/14/15 1339    Clinical Impression Statement A: Reassessment and discharge completed this date. patient has met all therapy goals and progressed well in therapy. Patient was given an  unpdated HEP and exercises were reviewed. Pt in agreement with discharge. Discussed starting to return to normal daily tasks and not be so hesistant using RUE.    Plan P: Discharge with HEP.        Problem List Patient Active Problem List   Diagnosis Date Noted  . GERD (gastroesophageal reflux disease) 04/10/2013  . Eosinophilic esophagitis 16/11/930  . Esophageal dysphagia 04/10/2013  OCCUPATIONAL THERAPY DISCHARGE SUMMARY  Visits from Start of Care: 18  Current functional level related to goals / functional  outcomes: See goals above   Remaining deficits: None   Education / Equipment: Shoulder strengthening exercises Plan: Patient agrees to discharge.  Patient goals were met. Patient is being discharged due to meeting the stated rehab goals.  ?????       Ailene Ravel, OTR/L,CBIS  [phone]  10/14/2015, 1:42 PM  [place] [address] [place], [place], [address] Phone: [phone]   Fax:  [phone]  Name: PERCY WINTERROWD MRN: [id number] Date of Birth: 02/26/1961

## 2015-10-14 NOTE — Patient Instructions (Signed)
Strengthening: Chest Pull - Resisted   Hold Theraband in front of body with hands about shoulder width a part. Pull band a part and back together slowly. Repeat _12-15___ times. Complete __1__ set(s) per session.. Repeat __1__ session(s) per day.  http://orth.exer.us/926   Copyright  VHI. All rights reserved.   PNF Strengthening: Resisted   Standing with resistive band around each hand, bring right arm up and away, thumb back. Repeat _12-15___ times per set. Do __1__ sets per session. Do __1__ sessions per day.    Resisted External Rotation: in Neutral - Bilateral   Sit or stand, tubing in both hands, elbows at sides, bent to 90, forearms forward. Pinch shoulder blades together and rotate forearms out. Keep elbows at sides. Repeat _12-15___ times per set. Do __1__ sets per session. Do _1___ sessions per day.  http://orth.exer.us/966   Copyright  VHI. All rights reserved.   PNF Strengthening: Resisted   Standing, hold resistive band above head. Bring right arm down and out from side. Repeat _12-15___ times per set. Do _1___ sets per session. Do __1__ sessions per day.  http://orth.exer.us/922   Copyright  VHI. All rights reserved.    

## 2015-10-26 ENCOUNTER — Telehealth: Payer: Self-pay | Admitting: Family Medicine

## 2015-10-26 NOTE — Telephone Encounter (Signed)
Spoke to pt

## 2016-11-16 ENCOUNTER — Emergency Department (HOSPITAL_COMMUNITY)
Admission: EM | Admit: 2016-11-16 | Discharge: 2016-11-16 | Disposition: A | Discharge status: Home / Self Care | Payer: Worker's Compensation | Attending: Emergency Medicine | Admitting: Emergency Medicine

## 2016-11-16 ENCOUNTER — Emergency Department (HOSPITAL_COMMUNITY): Payer: Worker's Compensation

## 2016-11-16 ENCOUNTER — Encounter (HOSPITAL_COMMUNITY): Payer: Self-pay | Admitting: Emergency Medicine

## 2016-11-16 DIAGNOSIS — S6992XA Unspecified injury of left wrist, hand and finger(s), initial encounter: Secondary | ICD-10-CM | POA: Diagnosis present

## 2016-11-16 DIAGNOSIS — Z87891 Personal history of nicotine dependence: Secondary | ICD-10-CM | POA: Insufficient documentation

## 2016-11-16 DIAGNOSIS — S63259A Unspecified dislocation of unspecified finger, initial encounter: Secondary | ICD-10-CM

## 2016-11-16 DIAGNOSIS — Y990 Civilian activity done for income or pay: Secondary | ICD-10-CM | POA: Diagnosis not present

## 2016-11-16 DIAGNOSIS — W010XXA Fall on same level from slipping, tripping and stumbling without subsequent striking against object, initial encounter: Secondary | ICD-10-CM | POA: Insufficient documentation

## 2016-11-16 DIAGNOSIS — Y9389 Activity, other specified: Secondary | ICD-10-CM | POA: Diagnosis not present

## 2016-11-16 DIAGNOSIS — S63287A Dislocation of proximal interphalangeal joint of left little finger, initial encounter: Secondary | ICD-10-CM | POA: Diagnosis not present

## 2016-11-16 DIAGNOSIS — Y929 Unspecified place or not applicable: Secondary | ICD-10-CM | POA: Diagnosis not present

## 2016-11-16 IMAGING — DX DG FINGER LITTLE 2+V*L*
3 series · 3 of 3 positions shown · non-contrast
Comparison: [DATE] at [DATE]

CLINICAL DATA: Fifth PIP dislocation

EXAM:
LEFT LITTLE FINGER 2+V

[finger ap]
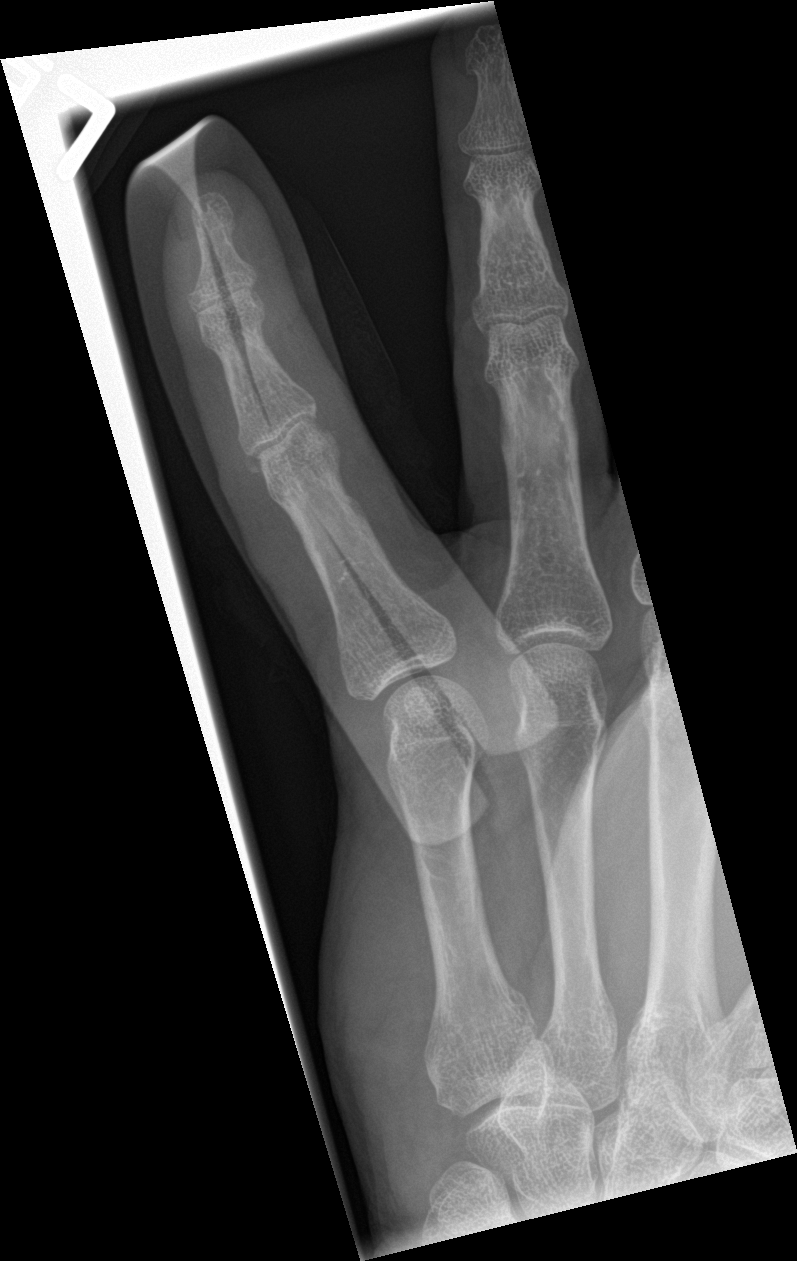

[finger obl]
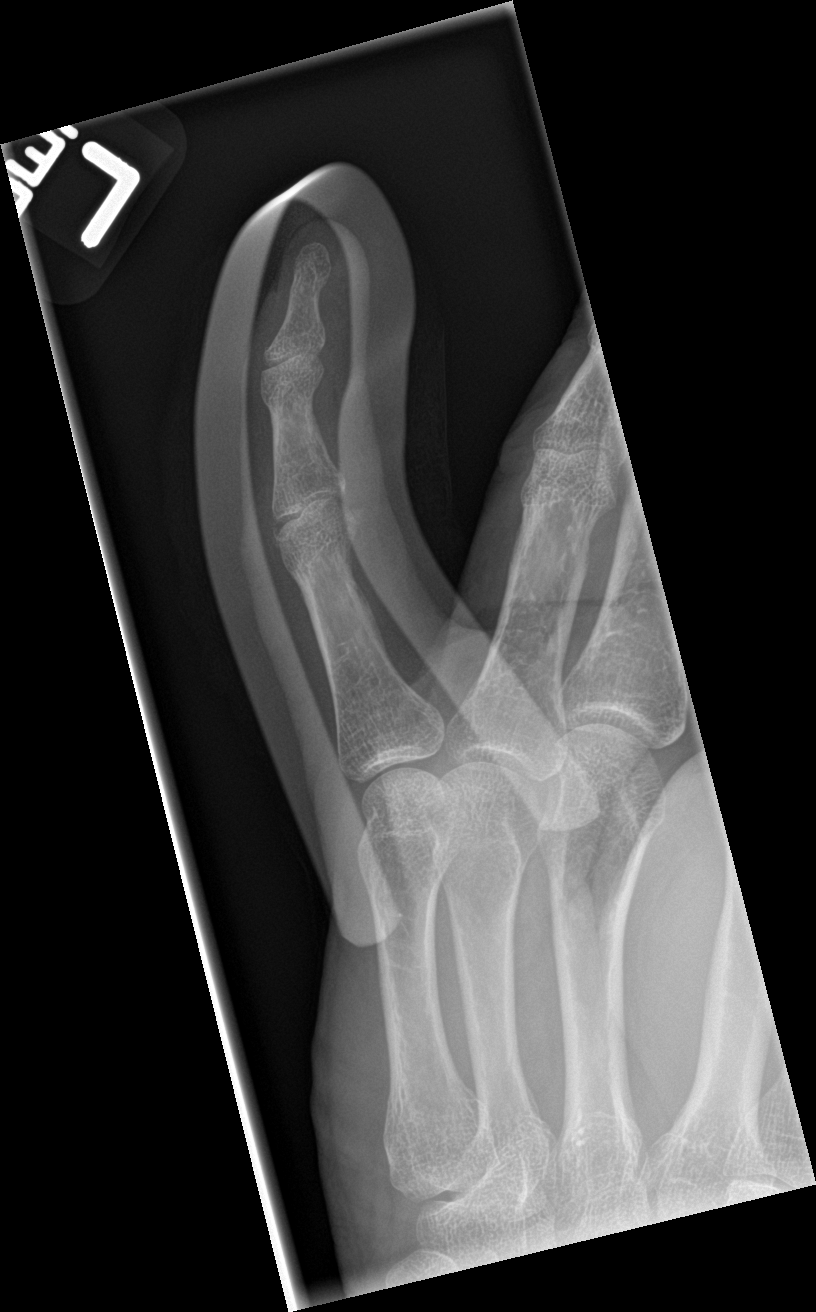

[finger lat]
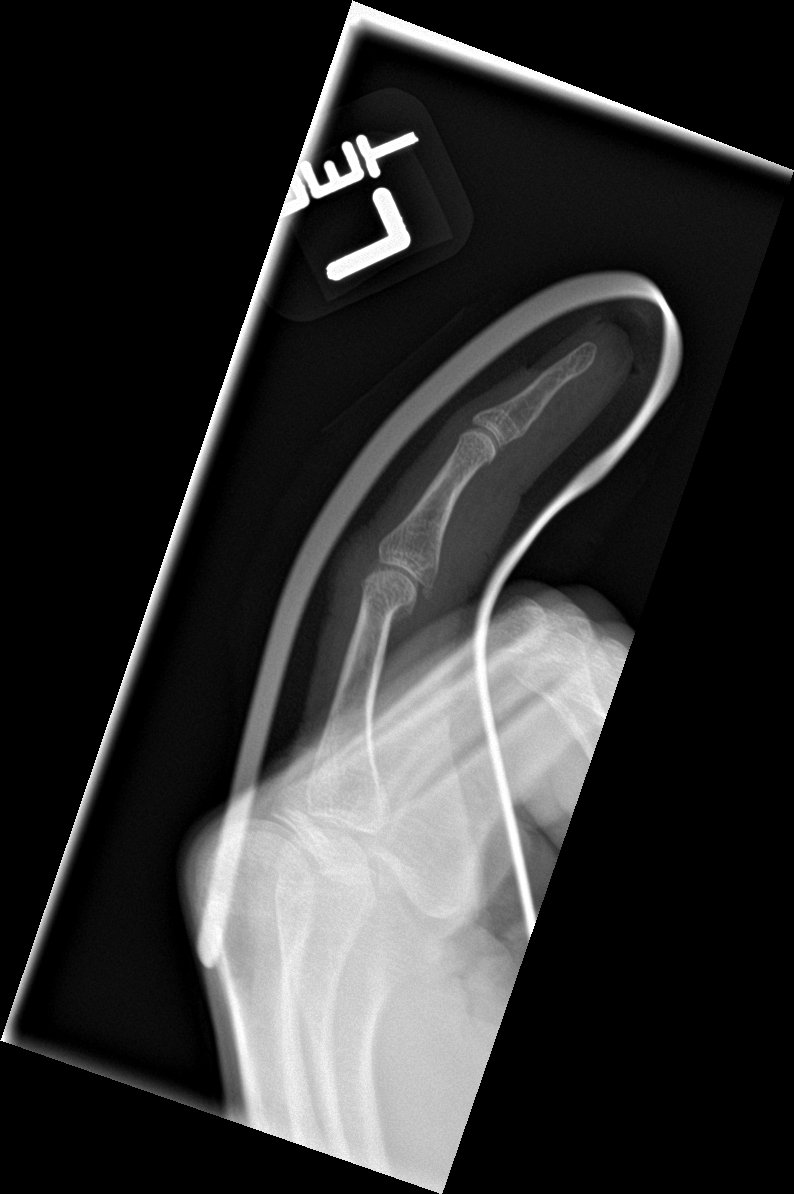

[3 of 3 positions shown; findings below may reference images not displayed]

FINDINGS: Three views obtained through a splint confirm successful reduction
of the PIP dislocation. There are fracture fragments at the palmar
and ulnar aspect of the joint.
IMPRESSION: Successful reduction.

## 2016-11-16 IMAGING — DX DG HAND COMPLETE 3+V*L*
4 series · 4 of 4 positions shown · non-contrast
Comparison: None.

CLINICAL DATA: 55-year-old male with a history of fall and pain at
the fifth finger

EXAM:
LEFT HAND - COMPLETE 3+ VIEW

[hand pa]
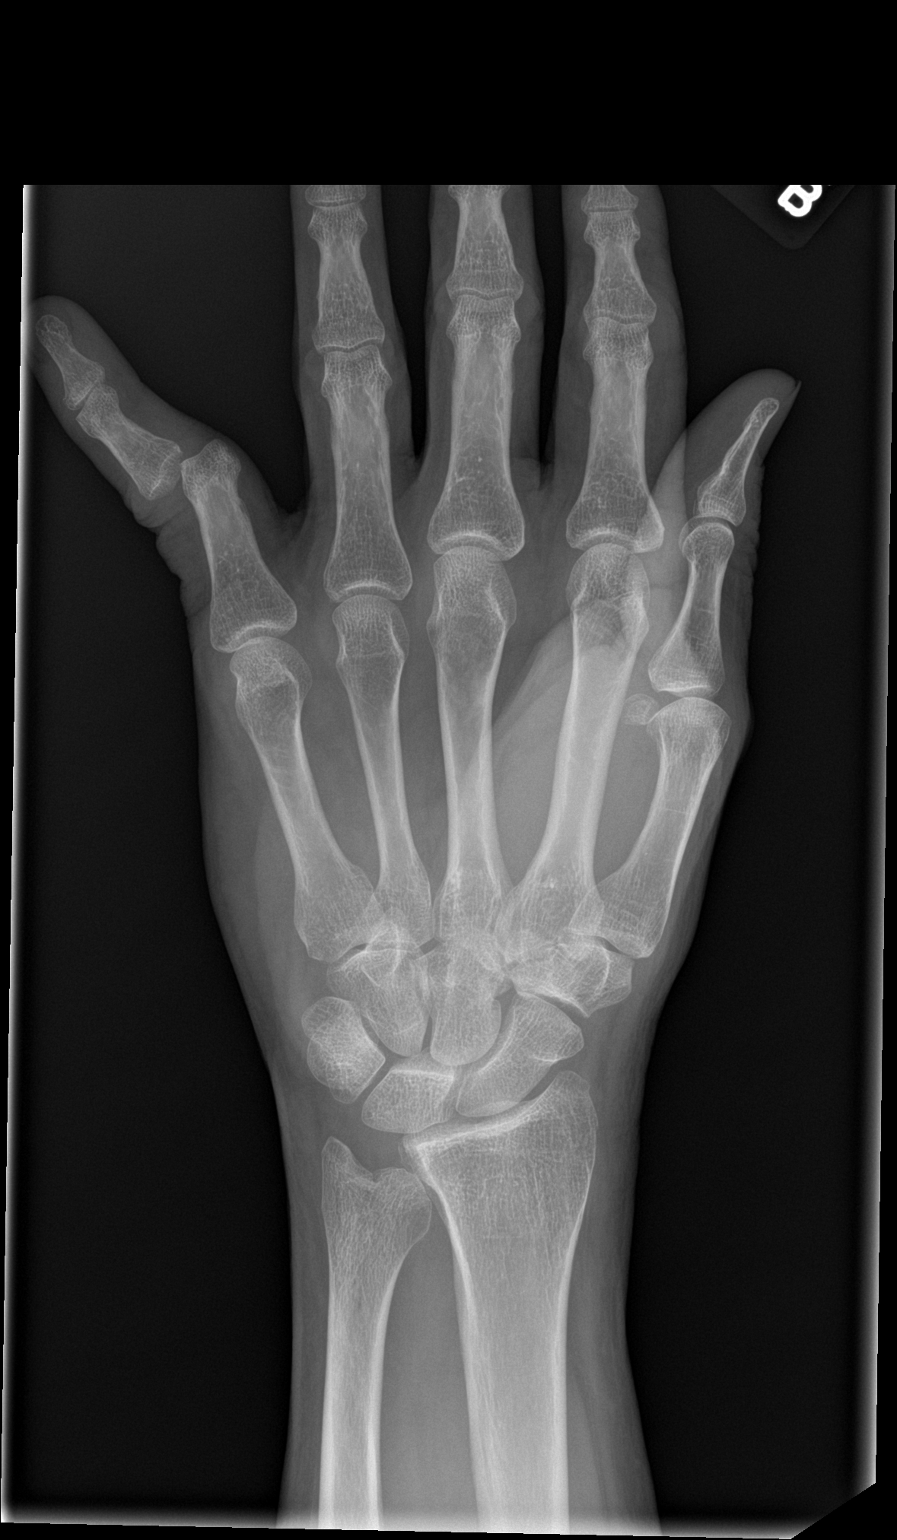

[hand obl]
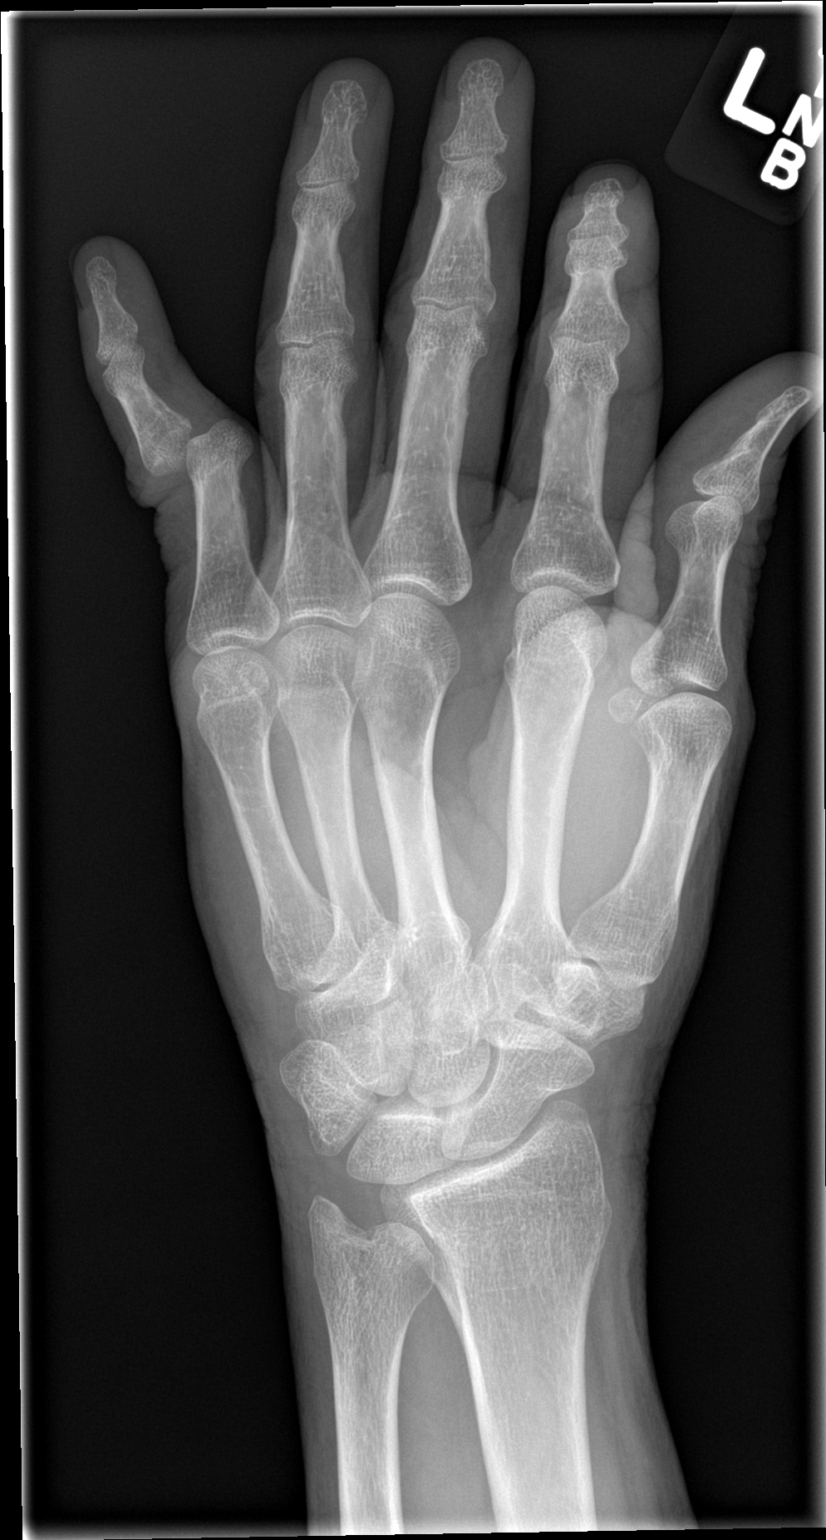

[hand lat (1 of 2)]
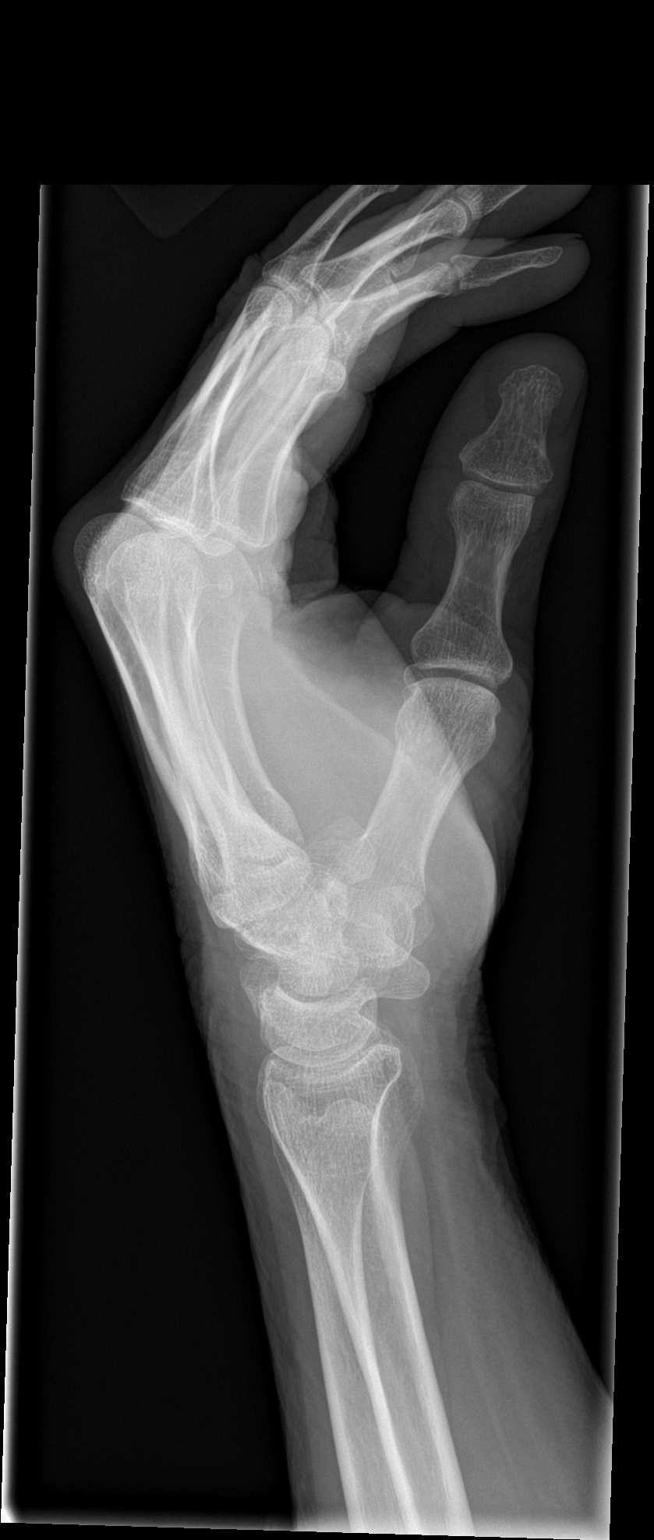

[hand lat (2 of 2)]
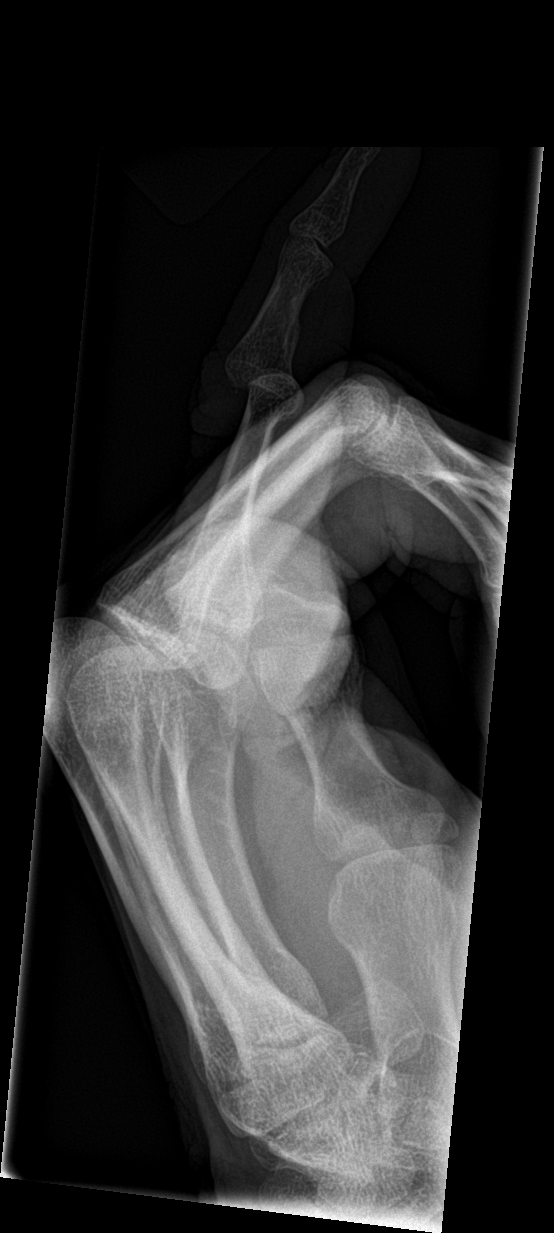

[4 of 4 positions shown; findings below may reference images not displayed]

FINDINGS: Acute dislocation of the proximal interphalangeal joint of the fifth
digit left hand. Associated soft tissue swelling. On the lateral
view there appears to be a tiny fracture fragment
IMPRESSION: Acute dislocation of the PIP left fifth digit, with what appears to
be a tiny fracture fragment on the lateral view.

## 2016-11-16 MED ORDER — ONDANSETRON 8 MG PO TBDP
ORAL_TABLET | ORAL | Status: AC
  Filled 2016-11-16: qty 1

## 2016-11-16 MED ORDER — HYDROCODONE-ACETAMINOPHEN 5-325 MG PO TABS: 1.0000 | ORAL_TABLET | ORAL | 0 refills | Status: DC | PRN

## 2016-11-16 MED ORDER — IBUPROFEN 600 MG PO TABS: 600.0000 mg | ORAL_TABLET | Freq: Three times a day (TID) | ORAL | 0 refills | Status: DC | PRN

## 2016-11-16 MED ORDER — LIDOCAINE HCL (PF) 2 % IJ SOLN
2.0000 mL | Freq: Once | INTRAMUSCULAR | Status: AC
  Administered 2016-11-16: 2 mL
  Filled 2016-11-16: qty 10

## 2016-11-16 MED ORDER — ONDANSETRON 8 MG PO TBDP
8.0000 mg | ORAL_TABLET | Freq: Once | ORAL | Status: AC
  Administered 2016-11-16: 8 mg via ORAL

## 2016-11-16 NOTE — ED Provider Notes (Signed)
[facility]-EMERGENCY DEPT Provider Note   CSN: [id number] Arrival date & time: 11/16/16  1546     History   Chief Complaint Chief Complaint  Patient presents with  . Fall    HPI [name] is a 55 y.o. male, right handed, presenting with left finger deformity after a fall sustained at work.  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  He denies hitting his head and denies loc.  He has an obvious deformity and pain in his left 5th finger.  He denies any other injury.  He has had no treatment prior to arrival.    The history is provided by the patient.    Past Medical History:  Diagnosis Date  . Clavicle fracture 07/27/2015  . Eosinophilic esophagitis 2008   Previously treated with systemic corticosteroid therapy as the patient could not afford Flovent at that time. Patient was referred to the allergist but did not go to the appointment.  Marland Kitchen. GERD (gastroesophageal reflux disease)   . Seasonal allergies     Patient Active Problem List   Diagnosis Date Noted  . GERD (gastroesophageal reflux disease) 04/10/2013  . Eosinophilic esophagitis 04/10/2013  . Esophageal dysphagia 04/10/2013    Past Surgical History:  Procedure Laterality Date  . ESOPHAGOGASTRODUODENOSCOPY  01/31/2007   WUJ:WJXBJYNWGNRMR:Esophageal food impaction/ Schatzki's ring, inflamed distal esophageal mucosa  . ESOPHAGOGASTRODUODENOSCOPY  02/14/2007   RMR: Somewhat ribbed appearing esophageal mucosa (biopsy-eosinophilic esophagitis), prominent Schatzki ring status post dilation with Maloney dilators.  Pale raised esophageal nodule status post biopsy (eosinophilic esophagitis). Moderate sized hiatal hernia.   Marland Kitchen. ESOPHAGOGASTRODUODENOSCOPY (EGD) WITH ESOPHAGEAL DILATION N/A 04/25/2013   FAO:ZHYQMVHRMR:Erosive reflux esophagitis/eosinophilic esophagitis biopsy proven/Hiatal hernia/Antral erosions. conservative esophageal  dilation       Home Medications    Prior to Admission medications   Medication Sig Start Date End Date Taking? Authorizing Provider  HYDROcodone-acetaminophen (NORCO/VICODIN) 5-325 MG tablet Take 1 tablet by mouth every 4 (four) hours as needed. 11/16/16   Burgess AmorJulie Ammarie Matsuura, PA-C  ibuprofen (ADVIL,MOTRIN) 600 MG tablet Take 1 tablet (600 mg total) by mouth every 8 (eight) hours as needed. 11/16/16   Burgess AmorJulie Jobina Maita, PA-C    Family History Family History  Problem Relation Age of Onset  . Cancer Mother   . Alcohol abuse Brother   . Colon cancer Neg Hx   . Liver disease Neg Hx     Social History Social History  Substance Use Topics  . Smoking status: Former Smoker    Packs/day: 0.20    Years: 25.00    Quit date: 08/05/1979  . Smokeless tobacco: Former NeurosurgeonUser  . Alcohol use No     Allergies   Patient has no known allergies.   Review of Systems Review of Systems  Constitutional: Negative for fever.  Musculoskeletal: Positive for arthralgias and joint swelling. Negative for myalgias.  Neurological: Negative for weakness and numbness.     Physical Exam Updated Vital Signs BP 142/91 (BP Location: Right Arm)   Pulse 91   Temp 98.1 F (36.7 C) (Oral)   Resp 20   Ht 5\' 11"  (1.803 m)   Wt 81.6 kg   SpO2 98%   BMI 25.10 kg/m   Physical Exam  Constitutional: He appears well-developed and well-nourished.  HENT:  Head: Atraumatic.  Neck: Normal range of motion.  Cardiovascular:  Pulses equal bilaterally  Musculoskeletal: He  exhibits tenderness and deformity.       Left hand: Normal sensation noted.       Hands: Neurological: He is alert. He has normal strength. He displays normal reflexes. No sensory deficit.  Skin: Skin is warm and dry.  Psychiatric: He has a normal mood and affect.     ED Treatments / Results  Labs (all labs ordered are listed, but only abnormal results are displayed) Labs Reviewed - No data to display  EKG  EKG Interpretation None        Radiology Dg Hand Complete Left  Result Date: 11/16/2016 CLINICAL DATA:  55 year old male with a history of fall and pain at the fifth finger EXAM: LEFT HAND - COMPLETE 3+ VIEW COMPARISON:  None. FINDINGS: Acute dislocation of the proximal interphalangeal joint of the fifth digit left hand. Associated soft tissue swelling. On the lateral view there appears to be a tiny fracture fragment IMPRESSION: Acute dislocation of the PIP left fifth digit, with what appears to be a tiny fracture fragment on the lateral view. Signed, Yvone NeuJaime S. Loreta AveWagner, DO [facility] Electronically Signed   By: Gilmer MorJaime  Wagner D.O.   On: 11/16/2016 16:10   Dg Finger Little Left  Result Date: 11/16/2016 CLINICAL DATA:  Fifth PIP dislocation EXAM: LEFT LITTLE FINGER 2+V COMPARISON:  11/16/2016 at 15:55 FINDINGS: Three views obtained through a splint confirm successful reduction of the PIP dislocation. There are fracture fragments at the palmar and ulnar aspect of the joint. IMPRESSION: Successful reduction. Electronically Signed   By: Ellery Plunkaniel R Mitchell M.D.   On: 11/16/2016 18:12    Procedures Procedures (including critical care time)  Reduction of dislocation Date/Time:6:00 PM Performed by: Burgess AmorIDOL, Misaki Sozio and Gray BernhardtElliot Wentz Authorized by: Burgess AmorIDOL, Lois Ostrom Consent: Verbal consent obtained. Risks and benefits: risks, benefits and alternatives were discussed Consent given by: patient Required items: required blood products, implants, devices, and special equipment available Time out: Immediately prior to procedure a "time out" was called to verify the correct patient, procedure, equipment, support staff and site/side marked as required.  Patient sedated: n/a.  Local injection given  Vitals: Vital signs were monitored during sedation. Patient tolerance: Patient tolerated the procedure well with no immediate complications. Joint: left 5th finger pip Reduction technique:  traction and relocation.  NERVE BLOCK Performed by: Burgess AmorIDOL, Lorella Gomez Consent: Verbal consent obtained. Required items: required blood products, implants, devices, and special equipment available Time out: Immediately prior to procedure a "time out" was called to verify the correct patient, procedure, equipment, support staff and site/side marked as required.  Indication: finger dislocation Nerve block body site: left 5th finger  Preparation: Patient was prepped and draped in the usual sterile fashion. Needle gauge: 25 G Location technique: anatomical landmarks  Local anesthetic: lidocaine 2% without epi  Anesthetic total: 2 ml  Outcome: pain improved Patient tolerance: Patient tolerated the procedure well with no immediate complications.     Medications Ordered in ED Medications  lidocaine (XYLOCAINE) 2 % injection 2 mL (2 mLs Other Given by Other 11/16/16 1753)  ondansetron (ZOFRAN-ODT) disintegrating tablet 8 mg (8 mg Oral Given 11/16/16 1753)     Initial Impression / Assessment and Plan / ED Course  I have reviewed the triage vital signs and the nursing notes.  Pertinent labs & imaging results that were available during my care of the patient were reviewed by me and considered in my medical decision making (see chart for details).  Clinical Course     Post  reduction films reduced.  Finger splint, ice, elevation.  Referral to Dr Romeo Apple for a recheck within one week.  Final Clinical Impressions(s) / ED Diagnoses   Final diagnoses:  Finger dislocation, initial encounter    New Prescriptions New Prescriptions   HYDROCODONE-ACETAMINOPHEN (NORCO/VICODIN) 5-325 MG TABLET    Take 1 tablet by mouth every 4 (four) hours as needed.   IBUPROFEN (ADVIL,MOTRIN) 600 MG TABLET    Take 1 tablet (600 mg total) by mouth every 8 (eight) hours as needed.     Burgess Amor, PA-C 11/16/16 1820    Mancel Bale, MD 11/17/16 [id number]

## 2016-11-16 NOTE — Discharge Instructions (Signed)
Wear the finger splint at all times to protect your finger and help prevent the joint from dislocating again.  Use the medicines prescribed.  Ice and elevation will help with pain and swelling.  You may take the hydrocodone prescribed for pain relief.  This will make you drowsy - do not drive within 4 hours of taking this medication.

## 2016-11-16 NOTE — ED Notes (Addendum)
Pa in with pt. Talked with Production designer, theatre/television/filmmanager at [employer]. Was given a 1 [phone] number which I called and they stated UDS not needed unless pt was operating heavy machinery. Pt was only taking groceries to a car.

## 2016-11-16 NOTE — ED Provider Notes (Signed)
  Face-to-face evaluation   History: He injured his left fifth finger, in a fall today.  Physical exam: Left small finger, with deformity at the PIP joint, palpable displacement of the middle phalanx and ulnar deviation.   After appropriate anesthesia, done by the PA, I was able to reduce the left fifth finger PIP dislocation. Reduction technique. Included increasing ulnar deviation, lengthening, then replacing the middle phalanx into the PIP joint. Patient tolerated procedure well. Imaging ordered for assessment of successful replacement of the PIP joint.    Medical screening examination/treatment/procedure(s) were conducted as a shared visit with non-physician practitioner(s) and myself.  I personally evaluated the patient during the encounter   Mancel BaleElliott Chiquetta Langner, MD 11/17/16 [phone]

## 2016-11-16 NOTE — ED Triage Notes (Signed)
Pt reports he fell from a sidewalk when he missed the stepdown. Injury to L hand small finger. Deformity noted.

## 2016-11-17 ENCOUNTER — Telehealth: Payer: Self-pay | Admitting: Orthopaedic Surgery

## 2016-11-17 NOTE — Telephone Encounter (Signed)
Patient called and stated he went to the ER for a WC injury to his finger.  He said he has seen Dr. Hilda LiasKeeling in the past but he was told yesterday to make an appointment with our office for next week.  I explained to [name] that we would not have a doctor in this office next week.  I asked him if he had spoken to the [facility] adjust[facility] handling his case and he said he had not.  I told him that they may want to set him up with a hand specialist or someone of their choosing that is in their network.  He understood this.  He is going to call his workplace and get the [facility] adjust[facility] information and see what steps he should take next.  I told him that if he is to be scheduled here in our office, we will need to speak to the [facility] adjust[facility] also.  He understands.

## 2022-01-21 ENCOUNTER — Inpatient Hospital Stay (HOSPITAL_COMMUNITY)
Admission: EM | Admit: 2022-01-21 | Discharge: 2022-01-23 | DRG: 948 | Disposition: A | Discharge status: Home Health | Payer: 59 | Attending: Internal Medicine | Admitting: Internal Medicine

## 2022-01-21 ENCOUNTER — Emergency Department (HOSPITAL_COMMUNITY): Payer: 59

## 2022-01-21 ENCOUNTER — Other Ambulatory Visit: Payer: Self-pay

## 2022-01-21 ENCOUNTER — Encounter (HOSPITAL_COMMUNITY): Payer: Self-pay

## 2022-01-21 DIAGNOSIS — Z811 Family history of alcohol abuse and dependence: Secondary | ICD-10-CM | POA: Diagnosis not present

## 2022-01-21 DIAGNOSIS — H269 Unspecified cataract: Secondary | ICD-10-CM | POA: Diagnosis present

## 2022-01-21 DIAGNOSIS — R634 Abnormal weight loss: Secondary | ICD-10-CM | POA: Diagnosis present

## 2022-01-21 DIAGNOSIS — E538 Deficiency of other specified B group vitamins: Secondary | ICD-10-CM | POA: Diagnosis present

## 2022-01-21 DIAGNOSIS — R531 Weakness: Secondary | ICD-10-CM | POA: Diagnosis present

## 2022-01-21 DIAGNOSIS — K219 Gastro-esophageal reflux disease without esophagitis: Secondary | ICD-10-CM | POA: Diagnosis present

## 2022-01-21 DIAGNOSIS — M6289 Other specified disorders of muscle: Secondary | ICD-10-CM | POA: Diagnosis present

## 2022-01-21 DIAGNOSIS — R4182 Altered mental status, unspecified: Secondary | ICD-10-CM | POA: Diagnosis present

## 2022-01-21 DIAGNOSIS — R4781 Slurred speech: Secondary | ICD-10-CM | POA: Diagnosis present

## 2022-01-21 DIAGNOSIS — Z6822 Body mass index (BMI) 22.0-22.9, adult: Secondary | ICD-10-CM

## 2022-01-21 DIAGNOSIS — R4189 Other symptoms and signs involving cognitive functions and awareness: Secondary | ICD-10-CM | POA: Diagnosis present

## 2022-01-21 DIAGNOSIS — R519 Headache, unspecified: Secondary | ICD-10-CM | POA: Diagnosis present

## 2022-01-21 DIAGNOSIS — I10 Essential (primary) hypertension: Secondary | ICD-10-CM | POA: Diagnosis present

## 2022-01-21 DIAGNOSIS — Z87891 Personal history of nicotine dependence: Secondary | ICD-10-CM | POA: Diagnosis not present

## 2022-01-21 DIAGNOSIS — Z20822 Contact with and (suspected) exposure to covid-19: Secondary | ICD-10-CM | POA: Diagnosis present

## 2022-01-21 DIAGNOSIS — R410 Disorientation, unspecified: Secondary | ICD-10-CM | POA: Diagnosis present

## 2022-01-21 LAB — DIFFERENTIAL
Abs Immature Granulocytes: 0.04 10*3/uL (ref 0.00–0.07)
Basophils Absolute: 0.1 10*3/uL (ref 0.0–0.1)
Basophils Relative: 1 %
Eosinophils Absolute: 0.1 10*3/uL (ref 0.0–0.5)
Eosinophils Relative: 1 %
Immature Granulocytes: 0 %
Lymphocytes Relative: 22 %
Lymphs Abs: 2.1 10*3/uL (ref 0.7–4.0)
Monocytes Absolute: 0.8 10*3/uL (ref 0.1–1.0)
Monocytes Relative: 8 %
Neutro Abs: 6.4 10*3/uL (ref 1.7–7.7)
Neutrophils Relative %: 68 %

## 2022-01-21 LAB — CBC
HCT: 47.5 % (ref 39.0–52.0)
Hemoglobin: 15.1 g/dL (ref 13.0–17.0)
MCH: 30 pg (ref 26.0–34.0)
MCHC: 31.8 g/dL (ref 30.0–36.0)
MCV: 94.4 fL (ref 80.0–100.0)
Platelets: 254 10*3/uL (ref 150–400)
RBC: 5.03 MIL/uL (ref 4.22–5.81)
RDW: 12.2 % (ref 11.5–15.5)
WBC: 9.5 10*3/uL (ref 4.0–10.5)
nRBC: 0 % (ref 0.0–0.2)

## 2022-01-21 LAB — URINALYSIS, ROUTINE W REFLEX MICROSCOPIC
Bilirubin Urine: NEGATIVE
Glucose, UA: NEGATIVE mg/dL
Hgb urine dipstick: NEGATIVE
Ketones, ur: NEGATIVE mg/dL
Leukocytes,Ua: NEGATIVE
Nitrite: NEGATIVE
Protein, ur: NEGATIVE mg/dL
Specific Gravity, Urine: 1.021 (ref 1.005–1.030)
pH: 7 (ref 5.0–8.0)

## 2022-01-21 LAB — COMPREHENSIVE METABOLIC PANEL
ALT: 22 U/L (ref 0–44)
AST: 25 U/L (ref 15–41)
Albumin: 4.1 g/dL (ref 3.5–5.0)
Alkaline Phosphatase: 54 U/L (ref 38–126)
Anion gap: 6 (ref 5–15)
BUN: 12 mg/dL (ref 8–23)
CO2: 27 mmol/L (ref 22–32)
Calcium: 9.3 mg/dL (ref 8.9–10.3)
Chloride: 106 mmol/L (ref 98–111)
Creatinine, Ser: 0.74 mg/dL (ref 0.61–1.24)
GFR, Estimated: >60 mL/min (ref >60)
Glucose, Bld: 101 mg/dL — ABNORMAL HIGH (ref 70–99)
Potassium: 4.1 mmol/L (ref 3.5–5.1)
Sodium: 139 mmol/L (ref 135–145)
Total Bilirubin: 1.1 mg/dL (ref 0.3–1.2)
Total Protein: 7 g/dL (ref 6.5–8.1)

## 2022-01-21 LAB — RESP PANEL BY RT-PCR (FLU A&B, COVID) ARPGX2
Influenza A by PCR: NEGATIVE
Influenza B by PCR: NEGATIVE
SARS Coronavirus 2 by RT PCR: NEGATIVE

## 2022-01-21 LAB — PROTIME-INR
INR: 1 (ref 0.8–1.2)
Prothrombin Time: 12.9 seconds (ref 11.4–15.2)

## 2022-01-21 LAB — RAPID URINE DRUG SCREEN, HOSP PERFORMED
Amphetamines: NOT DETECTED
Barbiturates: NOT DETECTED
Benzodiazepines: NOT DETECTED
Cocaine: NOT DETECTED
Opiates: NOT DETECTED
Tetrahydrocannabinol: NOT DETECTED

## 2022-01-21 LAB — APTT: aPTT: 25 seconds (ref 24–36)

## 2022-01-21 LAB — ETHANOL: Alcohol, Ethyl (B): <10 mg/dL (ref <10)

## 2022-01-21 IMAGING — CT CT HEAD W/O CM
3 of 4 series · 15 of 47 positions shown, 18 images · non-contrast
Comparison: None.

CLINICAL DATA: Neuro deficit, acute, stroke suspected



[Series 2: head w o · axial · 0.46mm/px · z∈[-35,+115]mm · 9 of 36 slices shown, 12 images]
[im 3/36  brain]
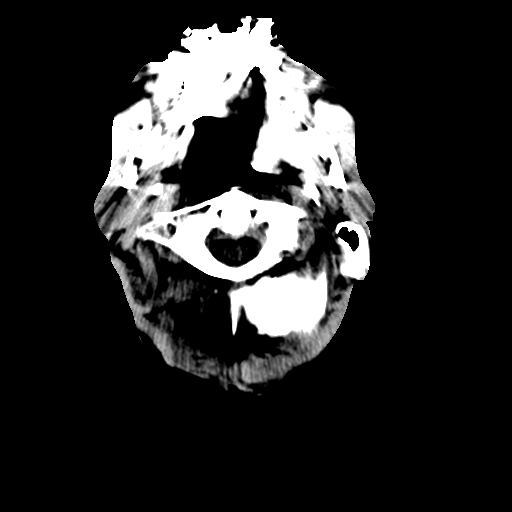
[im 3/36  bone]
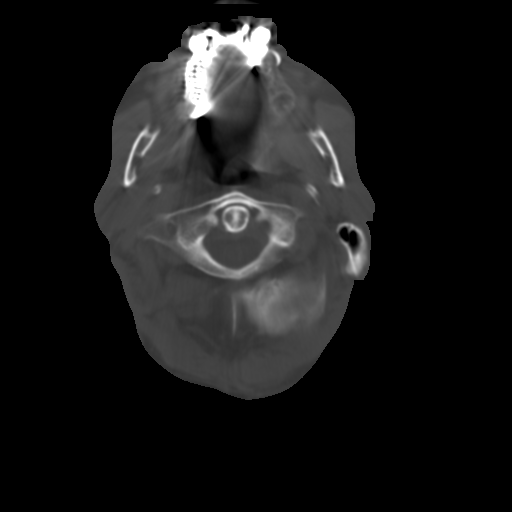
[im 8/36  brain]
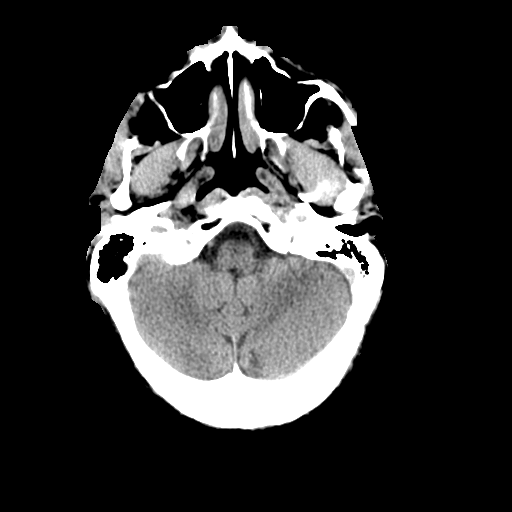
[im 11/36  brain]
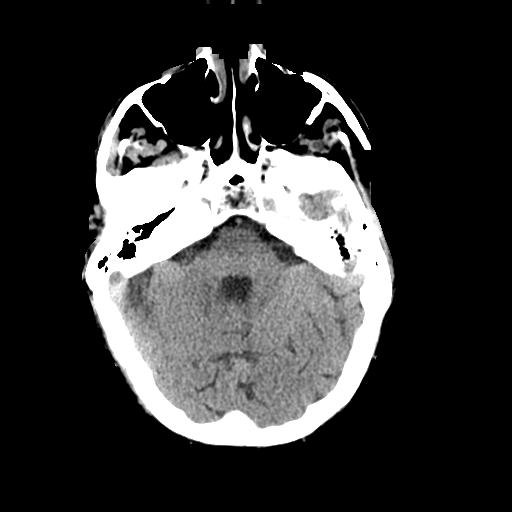
[im 16/36  brain]
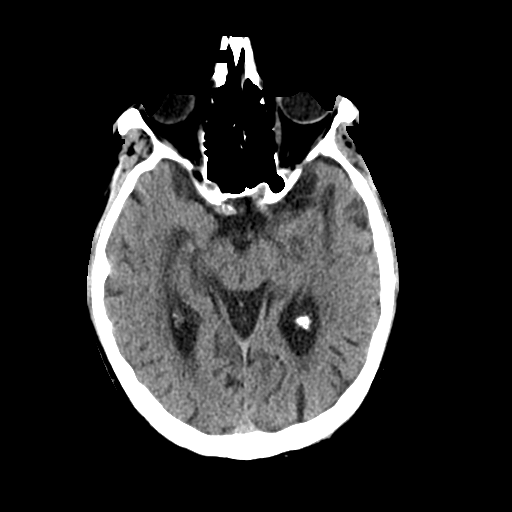
[im 18/36  brain]
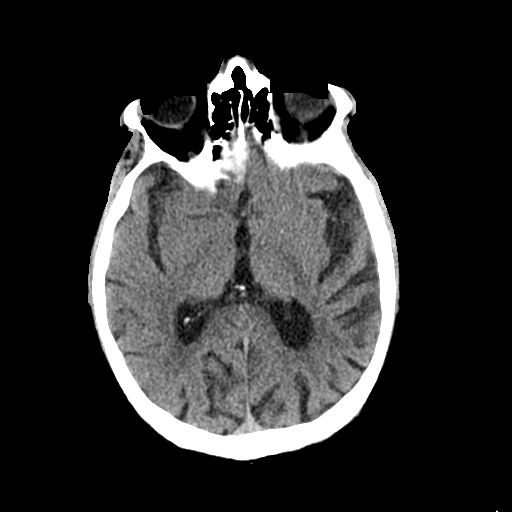
[im 18/36  bone]
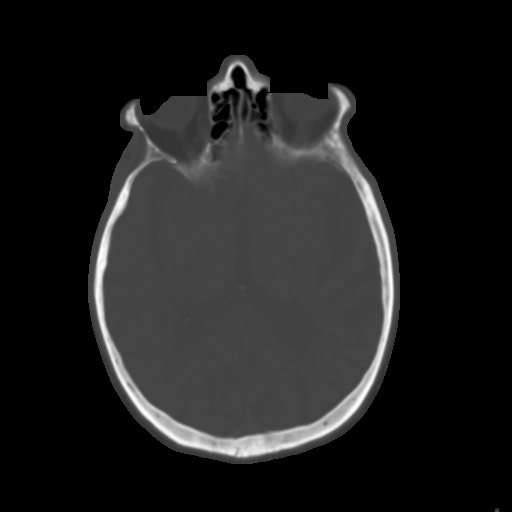
[im 21/36  brain]
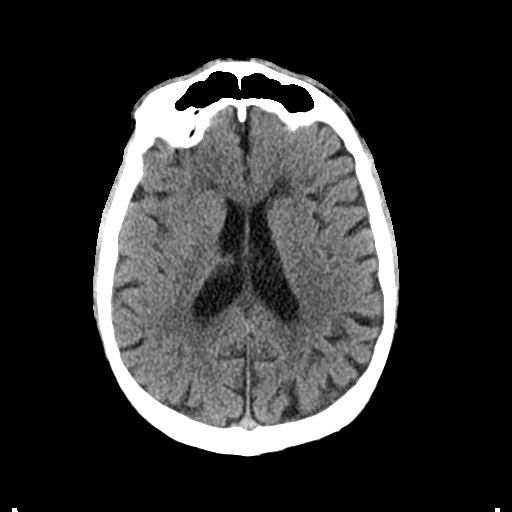
[im 26/36  brain]
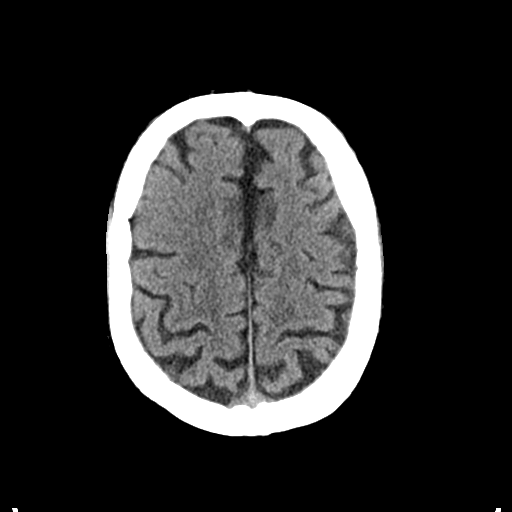
[im 28/36  brain]
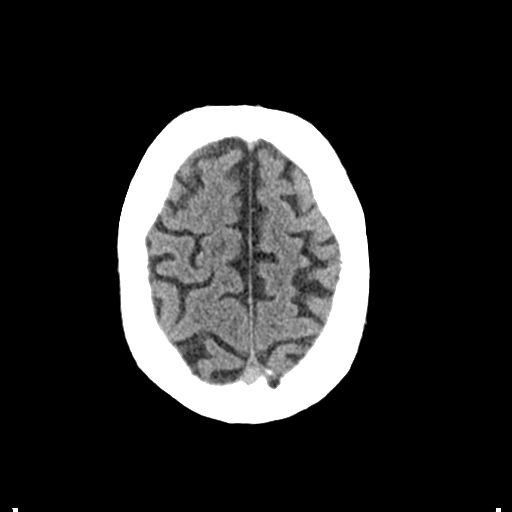
[im 33/36  brain]
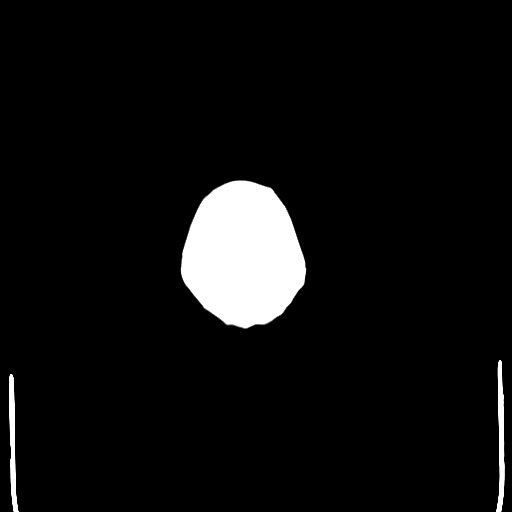
[im 33/36  bone]
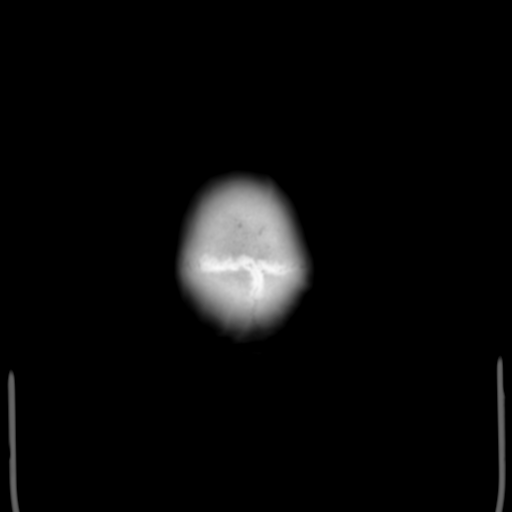

[Series 4: coronal soft · coronal · 0.36mm/px · 3 of 77 slices shown]
[im 26/77  brain]
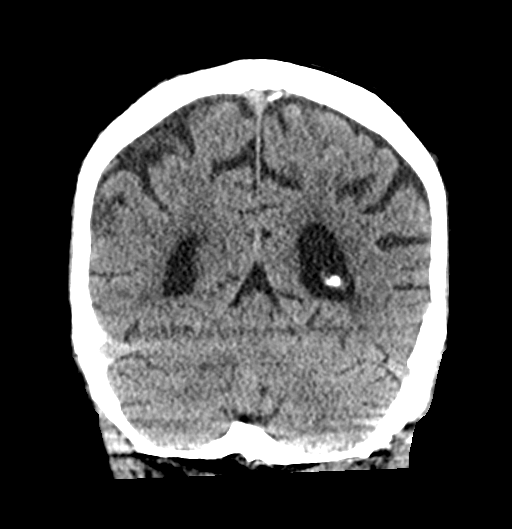
[im 34/77  brain]
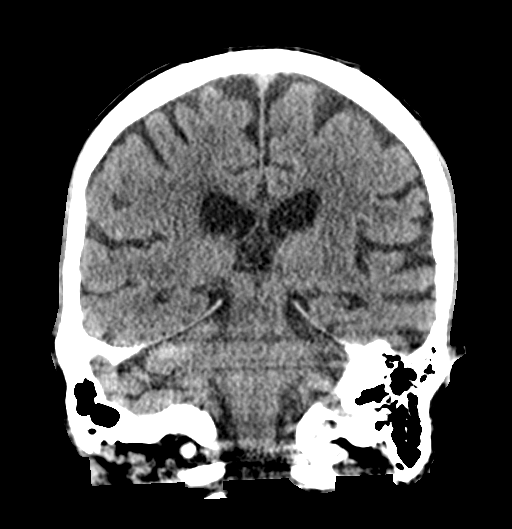
[im 43/77  brain]
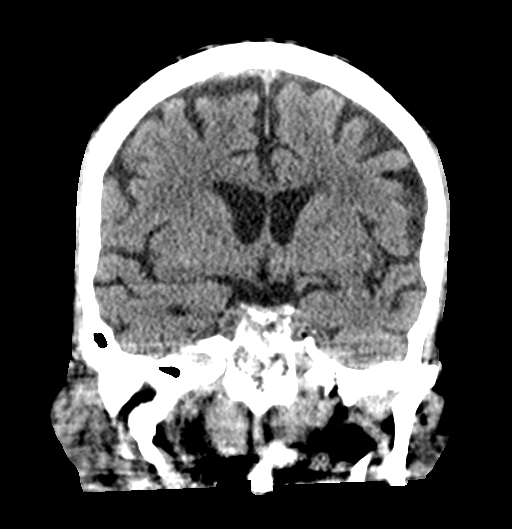

[Series 5: sagittal soft · sagittal · 0.36mm/px · 3 of 62 slices shown]
[im 21/62  brain]
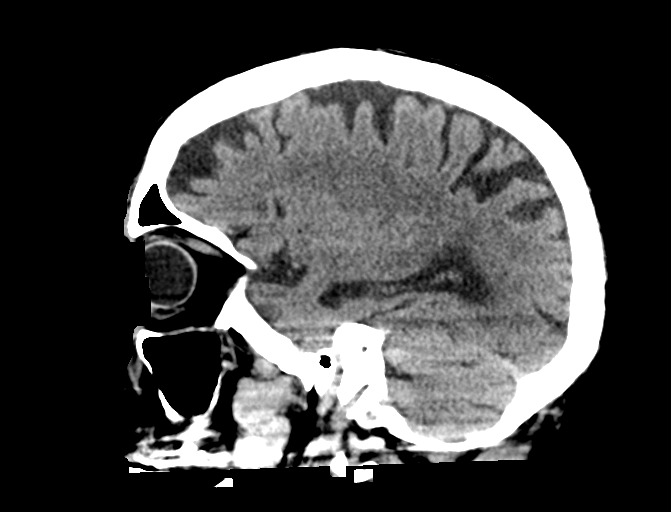
[im 31/62  brain]
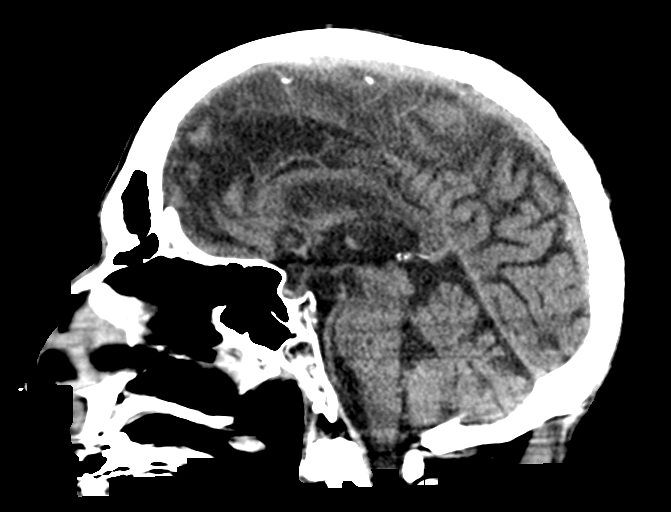
[im 41/62  brain]
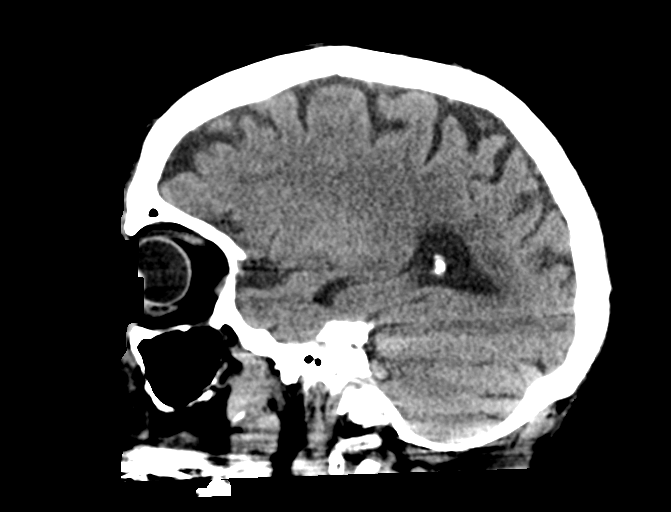

[15 of 47 positions shown; findings below may reference images not displayed]

FINDINGS: Brain: There is no acute intracranial hemorrhage, mass effect, or
edema. Gray-white differentiation is preserved. There is no
extra-axial fluid collection. Prominence of the ventricles and sulci
reflects minor parenchymal volume loss. Patchy low-density in the
supratentorial white matter is nonspecific but may reflect mild
chronic microvascular ischemic changes.

Vascular: No hyperdense vessel or unexpected calcification.

Skull: Calvarium is unremarkable.

Sinuses/Orbits: No acute finding.

Other: None.
IMPRESSION: No acute intracranial abnormality. Mild chronic microvascular
ischemic changes.

## 2022-01-21 MED ORDER — DORZOLAMIDE HCL-TIMOLOL MAL 2-0.5 % OP SOLN
1.0000 [drp] | Freq: Two times a day (BID) | OPHTHALMIC | Status: DC
  Administered 2022-01-22 – 2022-01-23 (×3): 1 [drp] via OPHTHALMIC
  Filled 2022-01-21 (×2): qty 10

## 2022-01-21 MED ORDER — PANTOPRAZOLE SODIUM 40 MG PO TBEC
40.0000 mg | DELAYED_RELEASE_TABLET | Freq: Every day | ORAL | Status: DC
  Administered 2022-01-21 – 2022-01-23 (×3): 40 mg via ORAL
  Filled 2022-01-21 (×3): qty 1

## 2022-01-21 MED ORDER — ENOXAPARIN SODIUM 40 MG/0.4ML IJ SOSY
40.0000 mg | PREFILLED_SYRINGE | INTRAMUSCULAR | Status: DC
  Administered 2022-01-21 – 2022-01-22 (×2): 40 mg via SUBCUTANEOUS
  Filled 2022-01-21 (×2): qty 0.4

## 2022-01-21 MED ORDER — ACETAMINOPHEN 325 MG PO TABS: 650.0000 mg | ORAL_TABLET | ORAL | Status: DC | PRN

## 2022-01-21 MED ORDER — ACETAMINOPHEN 650 MG RE SUPP: 650.0000 mg | RECTAL | Status: DC | PRN

## 2022-01-21 MED ORDER — STROKE: EARLY STAGES OF RECOVERY BOOK
Freq: Once | Status: DC
  Filled 2022-01-21: qty 1

## 2022-01-21 MED ORDER — LATANOPROST 0.005 % OP SOLN
1.0000 [drp] | Freq: Every day | OPHTHALMIC | Status: DC
  Administered 2022-01-22: 1 [drp] via OPHTHALMIC
  Filled 2022-01-21: qty 2.5

## 2022-01-21 MED ORDER — IBUPROFEN 600 MG PO TABS: 600.0000 mg | ORAL_TABLET | Freq: Three times a day (TID) | ORAL | Status: DC | PRN

## 2022-01-21 MED ORDER — ACETAMINOPHEN 160 MG/5ML PO SOLN: 650.0000 mg | ORAL | Status: DC | PRN

## 2022-01-21 NOTE — H&P (Signed)
History and Physical    Patient: [name] [id number] DOB: 1961-07-09 DOA: 01/21/2022 DOS: the patient was seen and examined on 01/21/2022 PCP: Pcp, No  Patient coming from: Home  Chief Complaint:  Chief Complaint  Patient presents with   Altered Mental Status    HPI: [name] is a 61 y.o. male with medical history significant of GERD, seasonal allergies.  Patient seen for proximal weakness.  History is obtained from patient and by the patient's boss who was present during the interview by the patient's request.  The patient has speech difficulties with extreme stuttering that includes nonsensical words, which makes understanding him very difficult.  I did ask the patient's boss, [name], whether this is new and she indicated that this is a chronic problem and something that has existed for several years.  The patient did not show up for work yesterday, which is extremely unusual.  The patient called [name] this morning and asked her for a ride as he was having difficulty with his arms.  He mentioned to her that he thought he had a stroke and she immediately told him to call EMS and come to the hospital for evaluation.  The patient describes some heaviness in his upper arms, making it difficulty for him to lift and move his arms.  He does have fairly good use of his forearms and hands.  It does appear that the use of his right forearm is quite limited, as he uses his left hand to pick it up some.  He also admits to having a left frontal headache, onset of which she is difficult to determine.  Denies fevers, chills, nausea, vomiting.  He does have diminished appetite at baseline.  Review of Systems: As mentioned in the history of present illness. All other systems reviewed and are negative. Past Medical History:  Diagnosis Date   Clavicle fracture [id number]   Eosinophilic esophagitis [id number]   Previously treated with systemic corticosteroid therapy as the patient could not afford Flovent at  that time. Patient was referred to the allergist but did not go to the appointment.   GERD (gastroesophageal reflux disease)    Seasonal allergies    Past Surgical History:  Procedure Laterality Date   ESOPHAGOGASTRODUODENOSCOPY  01/31/2007   [id number] food impaction/ Schatzki's ring, inflamed distal esophageal mucosa   ESOPHAGOGASTRODUODENOSCOPY  02/14/2007   RMR: Somewhat ribbed appearing esophageal mucosa (biopsy-eosinophilic esophagitis), prominent Schatzki ring status post dilation with Maloney dilators.  Pale raised esophageal nodule status post biopsy (eosinophilic esophagitis). Moderate sized hiatal hernia.    ESOPHAGOGASTRODUODENOSCOPY (EGD) WITH ESOPHAGEAL DILATION N/A 04/25/2013   [id number] reflux esophagitis/eosinophilic esophagitis biopsy proven/Hiatal hernia/Antral erosions. conservative esophageal dilation   Social History:  reports that he quit smoking about 42 years ago. He has a 5.00 pack-year smoking history. He has quit using smokeless tobacco. He reports that he does not drink alcohol and does not use drugs.  No Known Allergies  Family History  Problem Relation Age of Onset   Cancer Mother    Alcohol abuse Brother    Colon cancer Neg Hx    Liver disease Neg Hx     Prior to Admission medications   Medication Sig Start Date End Date Taking? Authorizing Provider  dorzolamide-timolol (COSOPT) 22.3-6.8 MG/ML ophthalmic solution Place 1 drop into the right eye 2 (two) times daily. 12/09/21  Yes [provider]  ibuprofen (ADVIL,MOTRIN) 600 MG tablet Take 1 tablet (600 mg total) by mouth every 8 (eight) hours as needed. 11/16/16  Yes Idol, Almyra Free, PA-C  latanoprost (XALATAN) 0.005 % ophthalmic solution Place 1 drop into the right eye at bedtime. 12/09/21  Yes [provider]  HYDROcodone-acetaminophen (NORCO/VICODIN) 5-325 MG tablet Take 1 tablet by mouth every 4 (four) hours as needed. Patient not taking: Reported on 01/21/2022 11/16/16   Evalee Jefferson,  PA-C  omeprazole (PRILOSEC) 10 MG capsule Take by mouth. Patient not taking: Reported on 01/21/2022    [provider]    Physical Exam: Vitals:   01/21/22 1342 01/21/22 1500 01/21/22 1530 01/21/22 1622  BP: (!) 161/117 (!) 140/100 (!) 128/91   Pulse: 64 73 (!) 45 62  Resp: 20 (!) 29 (!) 24 12  Temp: 99 F (37.2 C)     TempSrc: Oral     SpO2: 100% 99% 99% 100%  Weight:      Height:       General: Older male. Awake and alert and oriented x3. No acute cardiopulmonary distress.  HEENT: Normocephalic atraumatic.  Right and left ears normal in appearance.  Pupils equal, round, reactive to light. Extraocular muscles are intact. Sclerae anicteric and noninjected.  Moist mucosal membranes. No mucosal lesions.  Neck: Neck supple without lymphadenopathy. No carotid bruits. No masses palpated.  Cardiovascular: Regular rate with normal S1-S2 sounds. No murmurs, rubs, gallops auscultated. No JVD.  Respiratory: Good respiratory effort with no wheezes, rales, rhonchi. Lungs clear to auscultation bilaterally.  No accessory muscle use. Abdomen: Soft, nontender, nondistended. Active bowel sounds. No masses or hepatosplenomegaly  Skin: No rashes, lesions, or ulcerations.  Dry, warm to touch. 2+ dorsalis pedis and radial pulses. Musculoskeletal: No calf or leg pain. All major joints not erythematous nontender.  No upper or lower joint deformation.  Good ROM.  No contractures  Psychiatric: Intact judgment and insight. Pleasant and cooperative. Neurologic: Stuttering speech with a lot of nonsensical words.  Patient has extremely limited use of his upper arms.  No specific rigidity with passive movement.  Cranial nerves are intact.  Sensation is grossly intact.  Data Reviewed: Results for orders placed or performed during the hospital encounter of 01/21/22 (from the past 24 hour(s))  Resp Panel by RT-PCR (Flu A&B, Covid) Nasopharyngeal Swab     Status: None   Collection Time: 01/21/22  1:54 PM    Specimen: Nasopharyngeal Swab; Nasopharyngeal(NP) swabs in vial transport medium  Result Value Ref Range   SARS Coronavirus 2 by RT PCR NEGATIVE NEGATIVE   Influenza A by PCR NEGATIVE NEGATIVE   Influenza B by PCR NEGATIVE NEGATIVE  Urine rapid drug screen (hosp performed)     Status: None   Collection Time: 01/21/22  1:54 PM  Result Value Ref Range   Opiates NONE DETECTED NONE DETECTED   Cocaine NONE DETECTED NONE DETECTED   Benzodiazepines NONE DETECTED NONE DETECTED   Amphetamines NONE DETECTED NONE DETECTED   Tetrahydrocannabinol NONE DETECTED NONE DETECTED   Barbiturates NONE DETECTED NONE DETECTED  Urinalysis, Routine w reflex microscopic Nasopharyngeal Swab     Status: Abnormal   Collection Time: 01/21/22  1:54 PM  Result Value Ref Range   Color, Urine YELLOW YELLOW   APPearance HAZY (A) CLEAR   Specific Gravity, Urine 1.021 1.005 - 1.030   pH 7.0 5.0 - 8.0   Glucose, UA NEGATIVE NEGATIVE mg/dL   Hgb urine dipstick NEGATIVE NEGATIVE   Bilirubin Urine NEGATIVE NEGATIVE   Ketones, ur NEGATIVE NEGATIVE mg/dL   Protein, ur NEGATIVE NEGATIVE mg/dL   Nitrite NEGATIVE NEGATIVE   Leukocytes,Ua NEGATIVE NEGATIVE  Ethanol     Status: None   Collection Time: 01/21/22  2:02 PM  Result Value Ref Range   Alcohol, Ethyl (B) <10 <10 mg/dL  Protime-INR     Status: None   Collection Time: 01/21/22  2:02 PM  Result Value Ref Range   Prothrombin Time 12.9 11.4 - 15.2 seconds   INR 1.0 0.8 - 1.2  APTT     Status: None   Collection Time: 01/21/22  2:02 PM  Result Value Ref Range   aPTT 25 24 - 36 seconds  CBC     Status: None   Collection Time: 01/21/22  2:02 PM  Result Value Ref Range   WBC 9.5 4.0 - 10.5 K/uL   RBC 5.03 4.22 - 5.81 MIL/uL   Hemoglobin 15.1 13.0 - 17.0 g/dL   HCT 12.8 11.8 - 86.7 %   MCV 94.4 80.0 - 100.0 fL   MCH 30.0 26.0 - 34.0 pg   MCHC 31.8 30.0 - 36.0 g/dL   RDW 73.7 36.6 - 81.5 %   Platelets 254 150 - 400 K/uL   nRBC 0.0 0.0 - 0.2 %  Differential      Status: None   Collection Time: 01/21/22  2:02 PM  Result Value Ref Range   Neutrophils Relative % 68 %   Neutro Abs 6.4 1.7 - 7.7 K/uL   Lymphocytes Relative 22 %   Lymphs Abs 2.1 0.7 - 4.0 K/uL   Monocytes Relative 8 %   Monocytes Absolute 0.8 0.1 - 1.0 K/uL   Eosinophils Relative 1 %   Eosinophils Absolute 0.1 0.0 - 0.5 K/uL   Basophils Relative 1 %   Basophils Absolute 0.1 0.0 - 0.1 K/uL   Immature Granulocytes 0 %   Abs Immature Granulocytes 0.04 0.00 - 0.07 K/uL  Comprehensive metabolic panel     Status: Abnormal   Collection Time: 01/21/22  2:02 PM  Result Value Ref Range   Sodium 139 135 - 145 mmol/L   Potassium 4.1 3.5 - 5.1 mmol/L   Chloride 106 98 - 111 mmol/L   CO2 27 22 - 32 mmol/L   Glucose, Bld 101 (H) 70 - 99 mg/dL   BUN 12 8 - 23 mg/dL   Creatinine, Ser 9.47 0.61 - 1.24 mg/dL   Calcium 9.3 8.9 - 07.6 mg/dL   Total Protein 7.0 6.5 - 8.1 g/dL   Albumin 4.1 3.5 - 5.0 g/dL   AST 25 15 - 41 U/L   ALT 22 0 - 44 U/L   Alkaline Phosphatase 54 38 - 126 U/L   Total Bilirubin 1.1 0.3 - 1.2 mg/dL   GFR, Estimated >15 >18 mL/min   Anion gap 6 5 - 15   CT HEAD WO CONTRAST ( )  Result Date: 01/21/2022 CLINICAL DATA:  Neuro deficit, acute, stroke suspected EXAM: CT HEAD WITHOUT CONTRAST TECHNIQUE: Contiguous axial images were obtained from the base of the skull through the vertex without intravenous contrast. RADIATION DOSE REDUCTION: This exam was performed according to the departmental dose-optimization program which includes automated exposure control, adjustment of the mA and/or kV according to patient size and/or use of iterative reconstruction technique. COMPARISON:  None. FINDINGS: Brain: There is no acute intracranial hemorrhage, mass effect, or edema. Gray-white differentiation is preserved. There is no extra-axial fluid collection. Prominence of the ventricles and sulci reflects minor parenchymal volume loss. Patchy low-density in the supratentorial white matter is  nonspecific but may reflect mild chronic microvascular ischemic changes. Vascular: No hyperdense vessel or  unexpected calcification. Skull: Calvarium is unremarkable. Sinuses/Orbits: No acute finding. Other: None. IMPRESSION: No acute intracranial abnormality. Mild chronic microvascular ischemic changes. Electronically Signed   By: Macy Mis M.D.   On: 01/21/2022 14:35     Assessment and Plan: No notes have been filed under this hospital service. Service: Hospitalist  Principal Problem:   Proximal weakness of limb   Proximal weakness of limbs. Uncertain of the etiology of this.  This does appear new. Neuro was consulted by EDP who recommended admission at [facility], MRI, EEG.  They will consult on the patient upon his arrival As there appears to be some ischemic cardiac changes on CT scan, will also had echocardiogram Hemoglobin A1c and lipid panel in the morning Start aspirin Permissive hypertension at the moment    Advance Care Planning:   Code Status: Not on file Full code  Consults: neuro  Family Communication: No family members, however the patient's boss, [name], was present during interview and exam.  She contributed to the history taking.  Severity of Illness: The appropriate patient status for this patient is INPATIENT. Inpatient status is judged to be reasonable and necessary in order to provide the required intensity of service to ensure the patient's safety. The patient's presenting symptoms, physical exam findings, and initial radiographic and laboratory data in the context of their chronic comorbidities is felt to place them at high risk for further clinical deterioration. Furthermore, it is not anticipated that the patient will be medically stable for discharge from the hospital within 2 midnights of admission.   * I certify that at the point of admission it is my clinical judgment that the patient will require inpatient hospital care spanning beyond 2  midnights from the point of admission due to high intensity of service, high risk for further deterioration and high frequency of surveillance required.*  Author: Truett Mainland, DO 01/21/2022 4:52 PM  For on call review [email or web address].

## 2022-01-21 NOTE — ED Triage Notes (Addendum)
PD called out for a welfare check, pt found to be altered with difficulty speaking and numbness in left arm. Unknown last known well. Pt has not presented for work in the last 1-2 days, work called for Comptroller. Pt with difficulty getting words out. CBG 108. Pt states he didn't go to work because both of his hands wouldn't work. Pt continues to grab wrist to try and straighten arm for neuro assessment. Pt with spontaneous movement of bilateral arms but unable to hold them up for neuro assessment. Pt states his arms are "bad" and caused by "old timers"

## 2022-01-21 NOTE — ED Provider Notes (Signed)
[facility] EMERGENCY DEPARTMENT Provider Note   CSN: [id number] Arrival date & time: 01/21/22  1321     History  Chief Complaint  Patient presents with   Altered Mental Status    [name] is a 61 y.o. male.  Patient was brought into the emergency department because he did not show up at work yesterday.  They called someone to check on him and when they got there he seemed confused and having difficulty getting his words out  The history is provided by the patient and the EMS personnel.  Altered Mental Status Presenting symptoms: behavior changes   Severity:  Moderate Most recent episode:  Today Episode history:  Continuous Timing:  Constant Progression:  Unchanged Chronicity:  New Context: not alcohol use   Associated symptoms: no abdominal pain       Home Medications Prior to Admission medications   Medication Sig Start Date End Date Taking? Authorizing Provider  dorzolamide-timolol (COSOPT) 22.3-6.8 MG/ML ophthalmic solution Place 1 drop into the right eye 2 (two) times daily. 12/09/21  Yes [provider]  ibuprofen (ADVIL,MOTRIN) 600 MG tablet Take 1 tablet (600 mg total) by mouth every 8 (eight) hours as needed. 11/16/16  Yes Idol, Raynelle Fanning, PA-C  latanoprost (XALATAN) 0.005 % ophthalmic solution Place 1 drop into the right eye at bedtime. 12/09/21  Yes [provider]  HYDROcodone-acetaminophen (NORCO/VICODIN) 5-325 MG tablet Take 1 tablet by mouth every 4 (four) hours as needed. Patient not taking: Reported on 01/21/2022 11/16/16   Burgess Amor, PA-C  omeprazole (PRILOSEC) 10 MG capsule Take by mouth. Patient not taking: Reported on 01/21/2022    [provider]      Allergies    Patient has no known allergies.    Review of Systems   Review of Systems  Unable to perform ROS: Mental status change  Gastrointestinal:  Negative for abdominal pain.   Physical Exam Updated Vital Signs BP (!) 140/100    Pulse 73    Temp 99 F (37.2 C)  (Oral)    Resp (!) 29    Ht 5\' 9"  (1.753 m)    Wt 68 kg    SpO2 99%    BMI 22.15 kg/m  Physical Exam Vitals and nursing note reviewed.  Constitutional:      Appearance: He is well-developed.  HENT:     Head: Normocephalic.     Nose: Nose normal.  Eyes:     General: No scleral icterus.    Conjunctiva/sclera: Conjunctivae normal.  Neck:     Thyroid: No thyromegaly.  Cardiovascular:     Rate and Rhythm: Normal rate and regular rhythm.     Heart sounds: No murmur heard.   No friction rub. No gallop.  Pulmonary:     Breath sounds: No stridor. No wheezing or rales.  Chest:     Chest wall: No tenderness.  Abdominal:     General: There is no distension.     Tenderness: There is no abdominal tenderness. There is no rebound.  Musculoskeletal:     Cervical back: Neck supple.  Lymphadenopathy:     Cervical: No cervical adenopathy.  Skin:    Findings: No erythema or rash.  Neurological:     Motor: No abnormal muscle tone.     Coordination: Coordination normal.     Comments: Patient is oriented to person and place but not situation.  Patient is able to lift his legs up but when he asked him to lift his arms up  he grabbed 1 arm with his opposite arm and lifts it up.  But if you lift his arms up for him he can hold them up.  Patient does not seem to understand what he needs to do  Psychiatric:        Behavior: Behavior normal.    ED Results / Procedures / Treatments   Labs (all labs ordered are listed, but only abnormal results are displayed) Labs Reviewed  COMPREHENSIVE METABOLIC PANEL - Abnormal; Notable for the following components:      Result Value   Glucose, Bld 101 (*)    All other components within normal limits  URINALYSIS, ROUTINE W REFLEX MICROSCOPIC - Abnormal; Notable for the following components:   APPearance HAZY (*)    All other components within normal limits  RESP PANEL BY RT-PCR (FLU A&B, COVID) ARPGX2  ETHANOL  PROTIME-INR  APTT  CBC  DIFFERENTIAL  RAPID  URINE DRUG SCREEN, HOSP PERFORMED    EKG None  Radiology CT HEAD WO CONTRAST ( )  Result Date: 01/21/2022 CLINICAL DATA:  Neuro deficit, acute, stroke suspected EXAM: CT HEAD WITHOUT CONTRAST TECHNIQUE: Contiguous axial images were obtained from the base of the skull through the vertex without intravenous contrast. RADIATION DOSE REDUCTION: This exam was performed according to the departmental dose-optimization program which includes automated exposure control, adjustment of the mA and/or kV according to patient size and/or use of iterative reconstruction technique. COMPARISON:  None. FINDINGS: Brain: There is no acute intracranial hemorrhage, mass effect, or edema. Gray-white differentiation is preserved. There is no extra-axial fluid collection. Prominence of the ventricles and sulci reflects minor parenchymal volume loss. Patchy low-density in the supratentorial white matter is nonspecific but may reflect mild chronic microvascular ischemic changes. Vascular: No hyperdense vessel or unexpected calcification. Skull: Calvarium is unremarkable. Sinuses/Orbits: No acute finding. Other: None. IMPRESSION: No acute intracranial abnormality. Mild chronic microvascular ischemic changes. Electronically Signed   By: Guadlupe Spanish M.D.   On: 01/21/2022 14:35    Procedures Procedures    Medications Ordered in ED Medications - No data to display  ED Course/ Medical Decision Making/ A&P  CRITICAL CARE Performed by: Bethann Berkshire Total critical care time: 40 minutes Critical care time was exclusive of separately billable procedures and treating other patients. Critical care was necessary to treat or prevent imminent or life-threatening deterioration. Critical care was time spent personally by me on the following activities: development of treatment plan with patient and/or surrogate as well as nursing, discussions with consultants, evaluation of patient's response to treatment, examination of patient,  obtaining history from patient or surrogate, ordering and performing treatments and interventions, ordering and review of laboratory studies, ordering and review of radiographic studies, pulse oximetry and re-evaluation of patient's condition.    I spoke with neurology Dr. Amada Jupiter and he felt like the patient needed to be admitted to [facility] and get a MRI of his head and the EEG   This patient presents to the ED for concern of stroke, this involves an extensive number of treatment options, and is a complaint that carries with it a high risk of complications and morbidity.  The differential diagnosis includes ischemic stroke brain tumor   Co morbidities that complicate the patient evaluation  No comorbidities   Additional history obtained:  Additional history obtained from EMS External records from outside source obtained and reviewed including hospital record   Lab Tests:  I Ordered, and personally interpreted labs.  The pertinent results include: CBC and chemistry which  were unremarkable   Imaging Studies ordered:  I ordered imaging studies including CT head I independently visualized and interpreted imaging which showed negative I agree with the radiologist interpretation   Cardiac Monitoring:  The patient was maintained on a cardiac monitor.  I personally viewed and interpreted the cardiac monitored which showed an underlying rhythm of: Normal sinus rhythm   Medicines ordered and prescription drug management: No medicine given. patient did not improve Test Considered:  MRI and EEG   Critical Interventions:  Transfer to [facility] to see neurology   Consultations Obtained:  I requested consultation with the neurology,  and discussed lab and imaging findings as well as pertinent plan - they recommend: MRI and EEG and transfer to   Problem List / ED Course:  Altered mental status and possible stroke   Reevaluation:  After the interventions  noted above, I reevaluated the patient and found that they have :stayed the same   Social Determinants of Health:  None   Dispostion:  After consideration of the diagnostic results and the patients response to treatment, I feel that the patent would benefit from direct transfer to [facility] with neurology consult there.                           Medical Decision Making Amount and/or Complexity of Data Reviewed Labs: ordered. Radiology: ordered.  Risk Decision regarding hospitalization.   Patient with altered mental status.  He will be admitted to [facility]        Final Clinical Impression(s) / ED Diagnoses Final diagnoses:  None    Rx / DC Orders ED Discharge Orders     None         Bethann Berkshire, MD 01/23/22 [phone]

## 2022-01-21 NOTE — ED Notes (Signed)
Pt feeding self dinner.

## 2022-01-22 ENCOUNTER — Inpatient Hospital Stay (HOSPITAL_COMMUNITY): Payer: 59

## 2022-01-22 ENCOUNTER — Other Ambulatory Visit (HOSPITAL_COMMUNITY): Payer: Self-pay

## 2022-01-22 ENCOUNTER — Encounter (HOSPITAL_COMMUNITY): Payer: Self-pay | Admitting: Family Medicine

## 2022-01-22 DIAGNOSIS — I6789 Other cerebrovascular disease: Secondary | ICD-10-CM

## 2022-01-22 LAB — CK: Total CK: 224 U/L (ref 49–397)

## 2022-01-22 LAB — LIPID PANEL
Cholesterol: 157 mg/dL (ref 0–200)
HDL: 43 mg/dL (ref >40)
LDL Cholesterol: 96 mg/dL (ref 0–99)
Total CHOL/HDL Ratio: 3.7 RATIO
Triglycerides: 90 mg/dL (ref <150)
VLDL: 18 mg/dL (ref 0–40)

## 2022-01-22 LAB — HEMOGLOBIN A1C
Hgb A1c MFr Bld: 4.9 % (ref 4.8–5.6)
Mean Plasma Glucose: 93.93 mg/dL

## 2022-01-22 LAB — FOLATE: Folate: 9.2 ng/mL (ref >5.9)

## 2022-01-22 LAB — ECHOCARDIOGRAM COMPLETE
Area-P 1/2: 2.22 cm2
Height: 69 in
S' Lateral: 3.2 cm
Weight: 2398.6 oz

## 2022-01-22 LAB — VITAMIN B12: Vitamin B-12: 179 pg/mL — ABNORMAL LOW (ref 180–914)

## 2022-01-22 LAB — TSH: TSH: 1.315 u[IU]/mL (ref 0.350–4.500)

## 2022-01-22 IMAGING — MR MR HEAD W/ CM
3 of 4 series · 28 of 48 positions shown · IV contrast (Gadavist)
Comparison: Noncontrast study earlier same day

CLINICAL DATA: Abnormal speech, proximal muscle weakness

EXAM:
MRI HEAD WITH CONTRAST
TECHNIQUE: Multiplanar, multiecho pulse sequences of the brain and surrounding
structures were obtained with intravenous contrast.
CONTRAST:  7mL GADAVIST GADOBUTROL 1 MMOL/ML IV SOLN

[Series 13: T2 post-contrast · coronal · 5.0mm · 0.72mm/px · 11 of 30 slices shown]
[im 1/30]
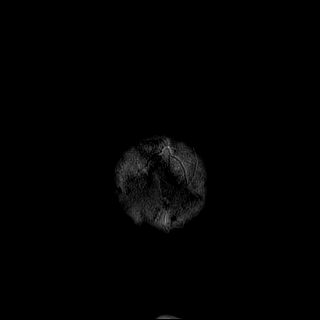
[im 3/30]
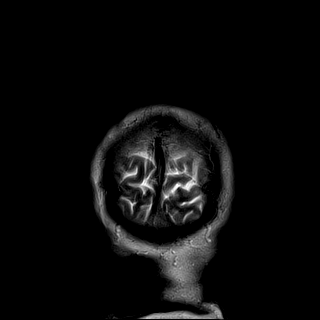
[im 6/30]
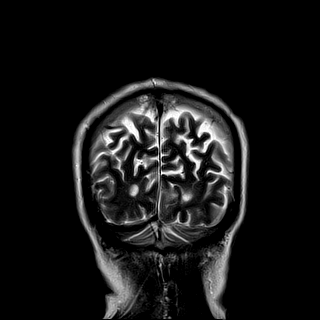
[im 9/30]
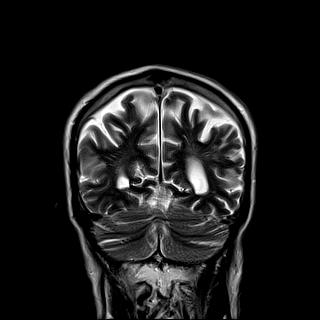
[im 12/30]
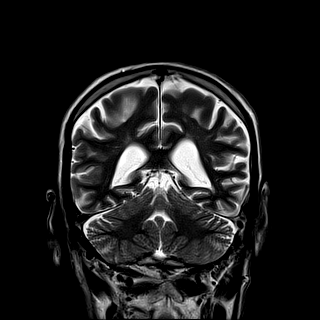
[im 15/30]
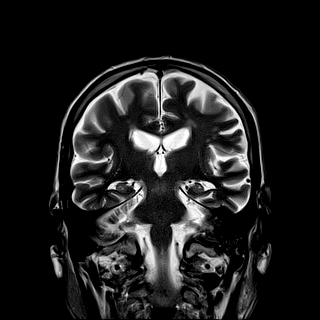
[im 18/30]
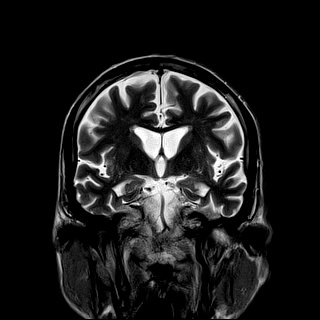
[im 21/30]
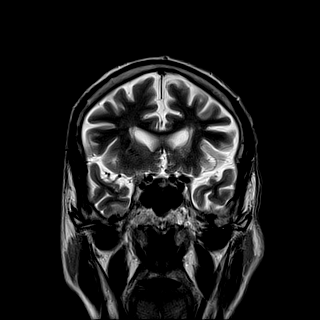
[im 24/30]
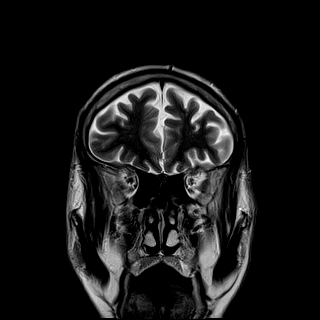
[im 27/30]
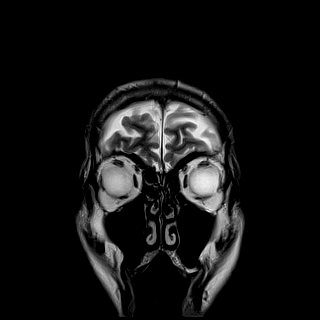
[im 30/30]
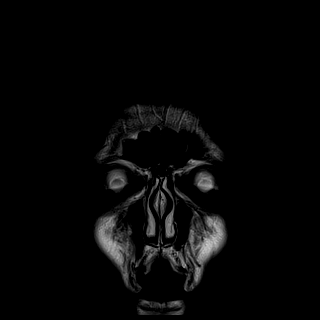

[Series 15: T1 post-contrast · coronal · 5.0mm · 0.34mm/px · 9 of 30 slices shown (1 of 2)]
[im 1/30]
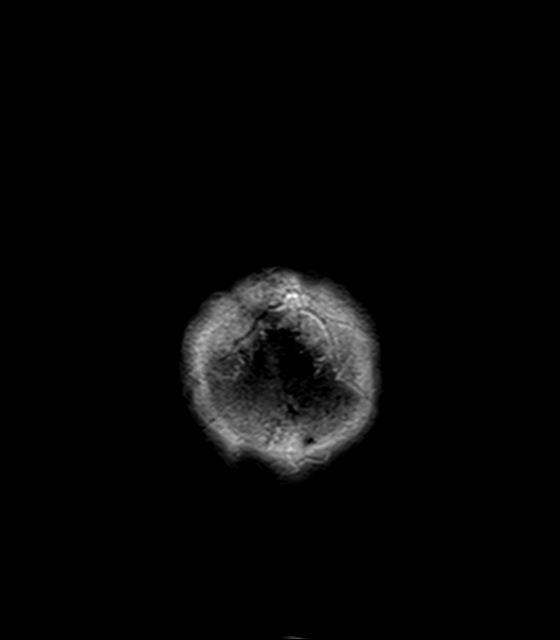
[im 4/30]
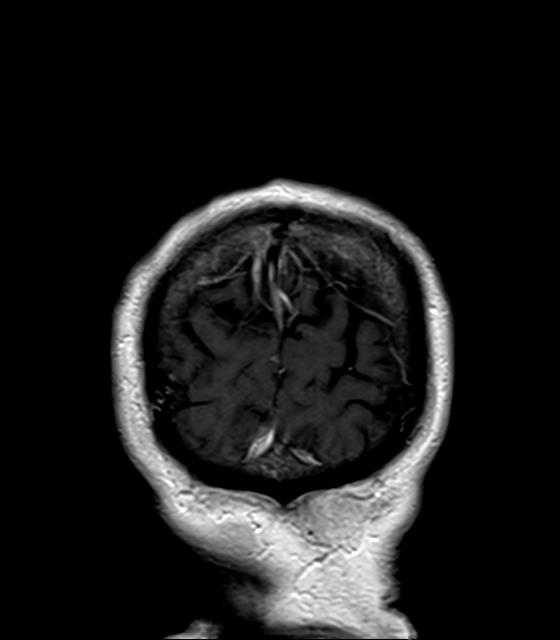
[im 7/30]
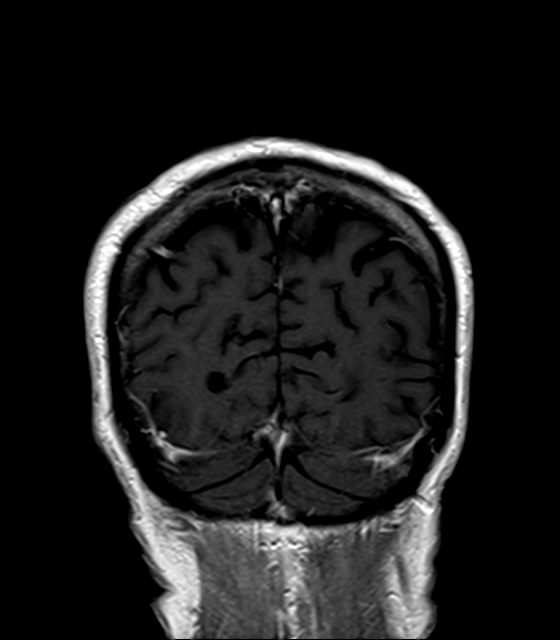
[im 10/30]
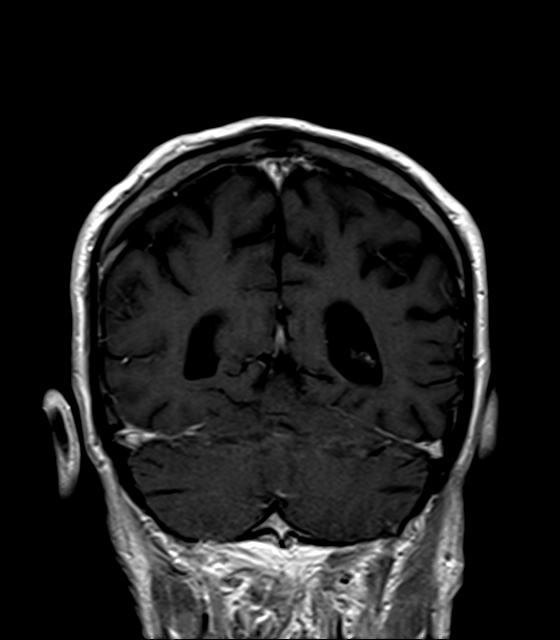
[im 13/30]
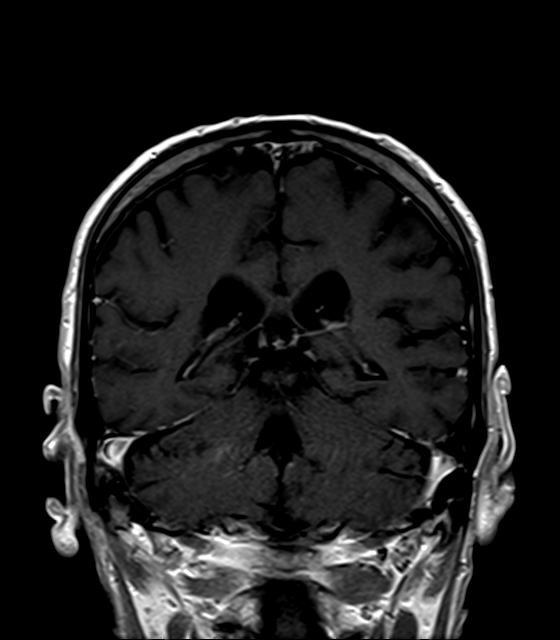
[im 17/30]
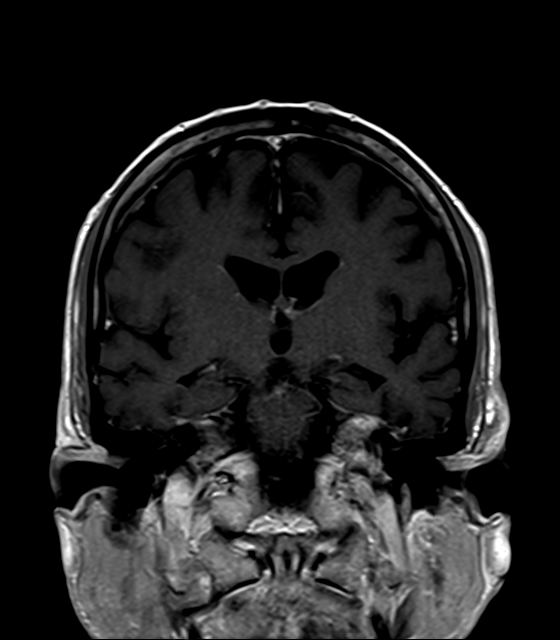
[im 20/30]
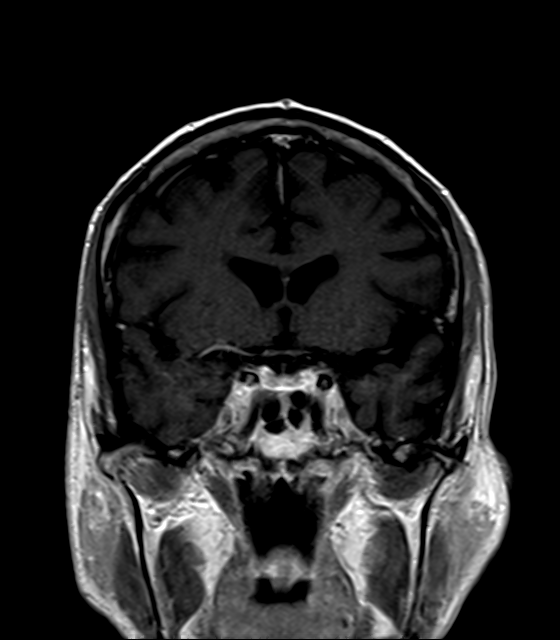
[im 26/30]
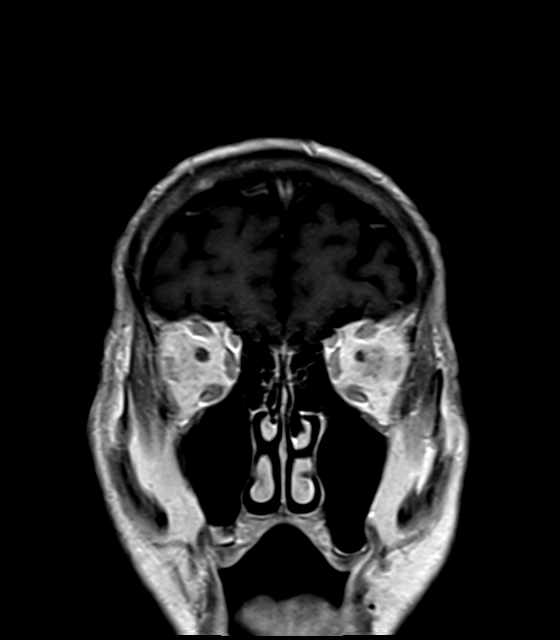
[im 30/30]
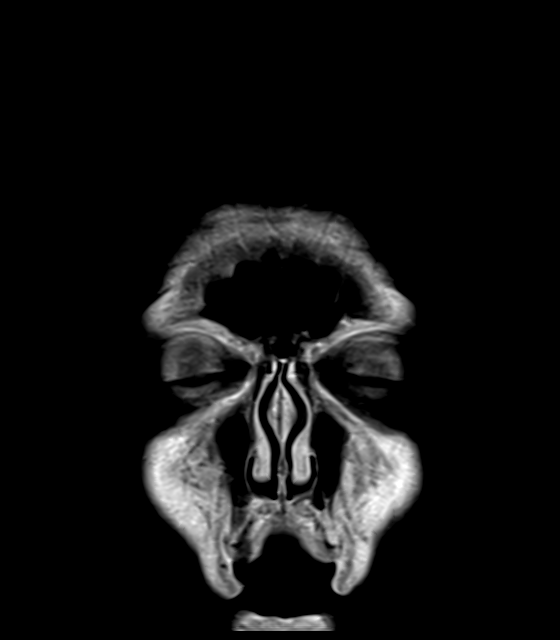

[Series 16: T1 post-contrast · sagittal · 5.0mm · 0.72mm/px · 8 of 24 slices shown (2 of 2)]
[im 1/24]
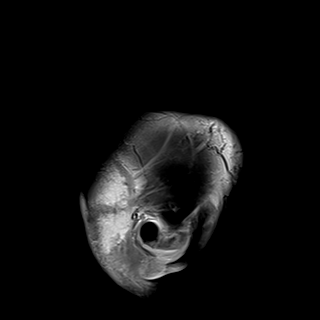
[im 4/24]
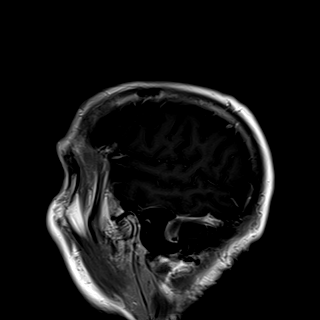
[im 7/24]
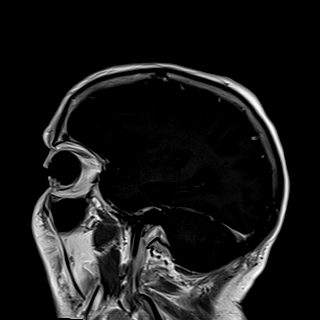
[im 10/24]
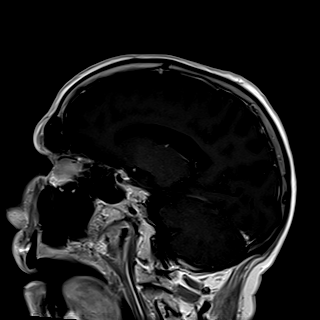
[im 14/24]
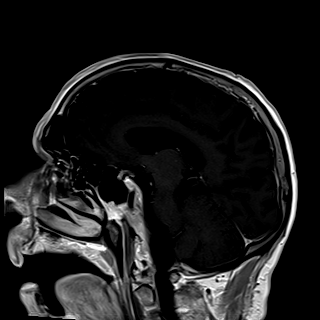
[im 17/24]
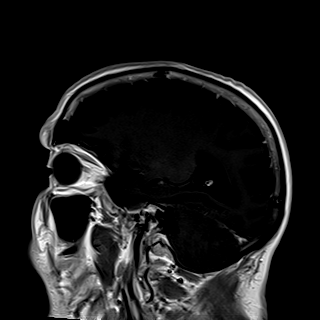
[im 20/24]
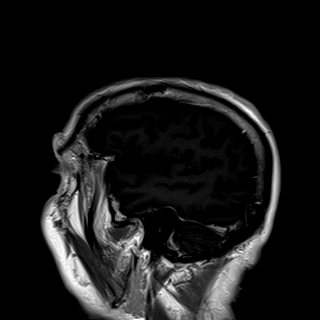
[im 24/24]
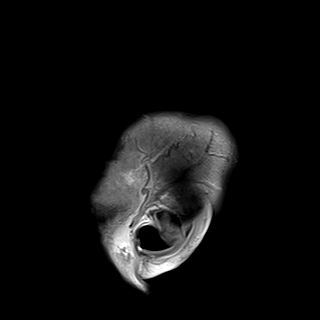

[28 of 48 positions shown; findings below may reference images not displayed]

FINDINGS: Postcontrast imaging demonstrates no abnormal intracranial
enhancement. No other significant abnormality identified.
IMPRESSION: No abnormal intracranial enhancement.

## 2022-01-22 IMAGING — MR MR CERVICAL SPINE WO/W CM
7 of 9 series · 26 of 48 positions shown · IV contrast (Gadavist)
Comparison: None.

CLINICAL DATA: Proximal muscle weakness

EXAM:
MRI CERVICAL SPINE WITHOUT AND WITH CONTRAST
TECHNIQUE: Multiplanar and multiecho pulse sequences of the cervical spine, to
include the craniocervical junction and cervicothoracic junction,
were obtained without and with intravenous contrast.
CONTRAST:  7mL GADAVIST GADOBUTROL 1 MMOL/ML IV SOLN

[Series 9: T2 · sagittal · 3.0mm · 0.69mm/px · 2 of 15 slices shown (1 of 2)]
[im 1/15]
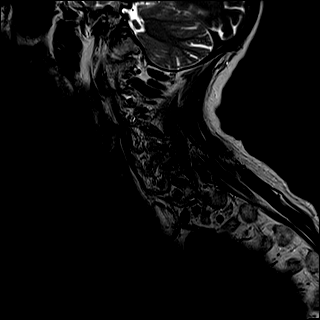
[im 15/15]
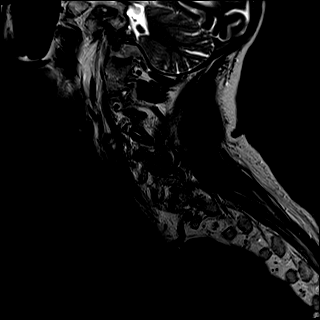

[Series 10: T1 · sagittal · 3.0mm · 0.69mm/px · 2 of 15 slices shown (1 of 2)]
[im 1/15]
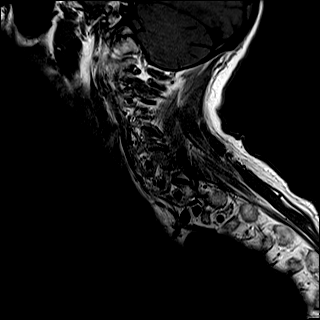
[im 15/15]
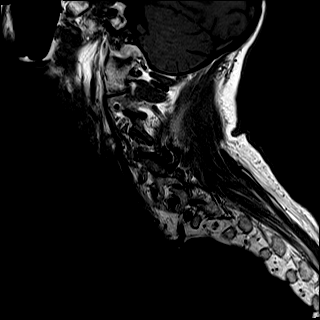

[Series 11: STIR · sagittal · 3.0mm · 0.86mm/px · 2 of 15 slices shown]
[im 1/15]
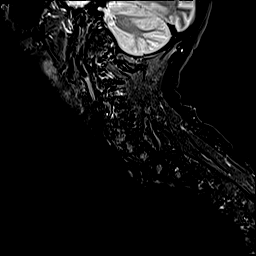
[im 15/15]
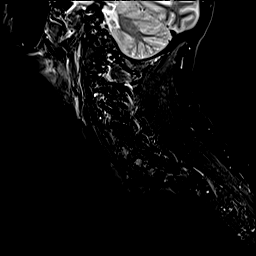

[Series 12: T2 · axial · 3.0mm · 0.66mm/px · z∈[-152,-47]mm · 5 of 40 slices shown (2 of 2)]
[im 1/40]
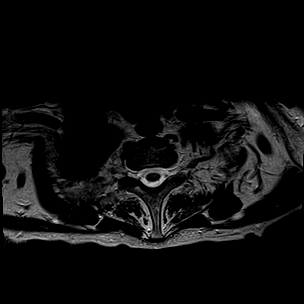
[im 10/40]
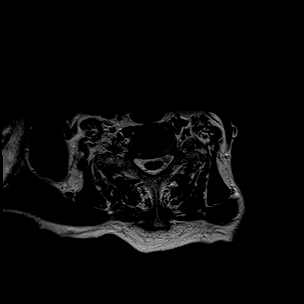
[im 20/40]
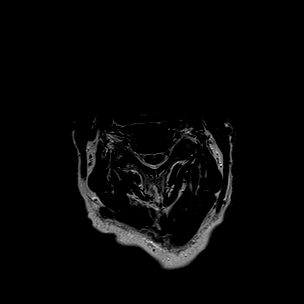
[im 30/40]
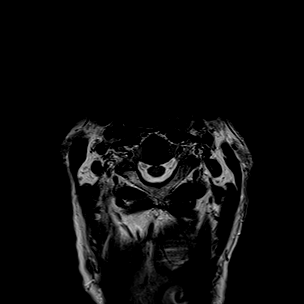
[im 40/40]
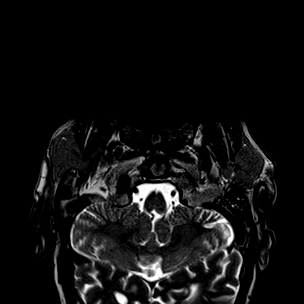

[Series 14: T1 · axial · 3.0mm · 0.39mm/px · z∈[-165,-33]mm · 7 of 50 slices shown (2 of 2)]
[im 1/50]
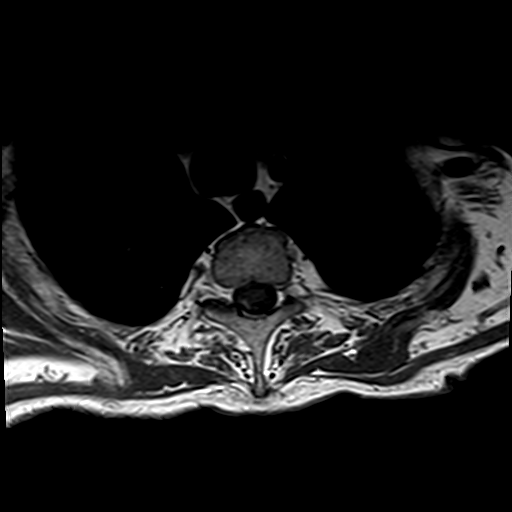
[im 9/50]
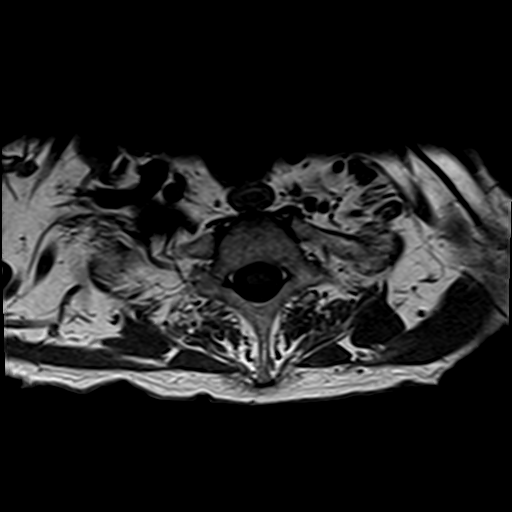
[im 17/50]
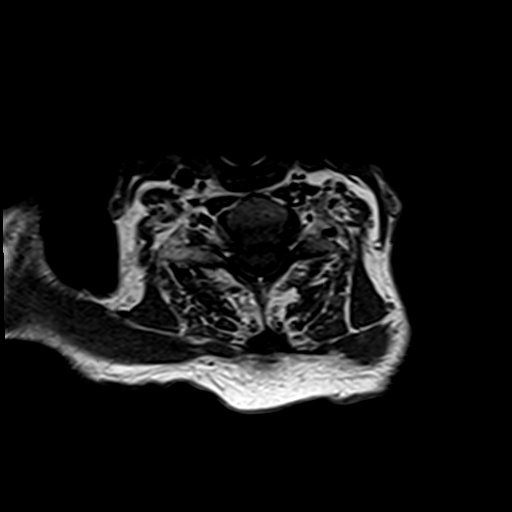
[im 25/50]
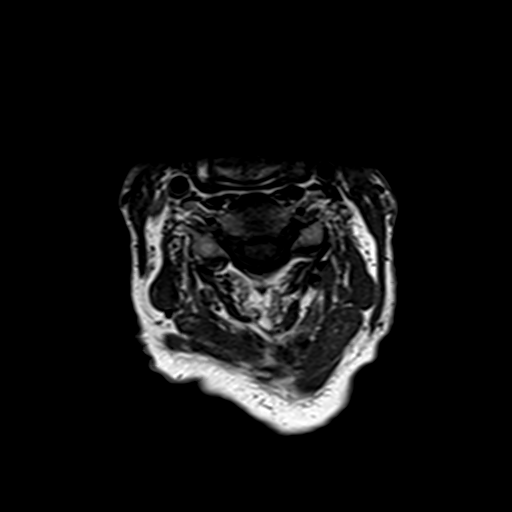
[im 33/50]
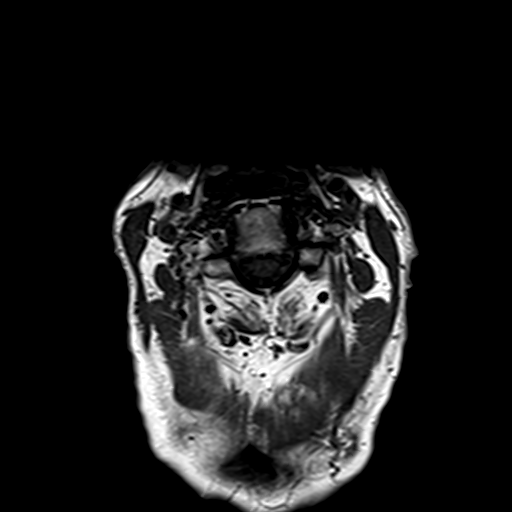
[im 41/50]
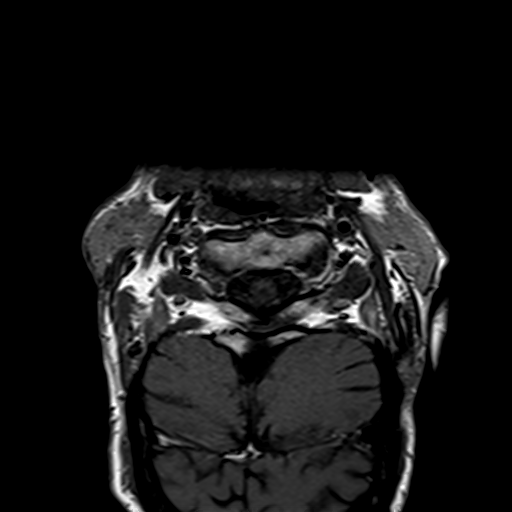
[im 50/50]
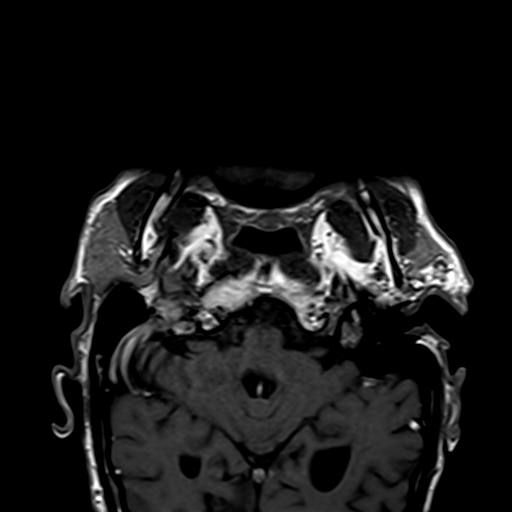

[Series 15: T1 fat-sat post-contrast · sagittal · 3.0mm · 0.43mm/px · 2 of 15 slices shown]
[im 1/15]
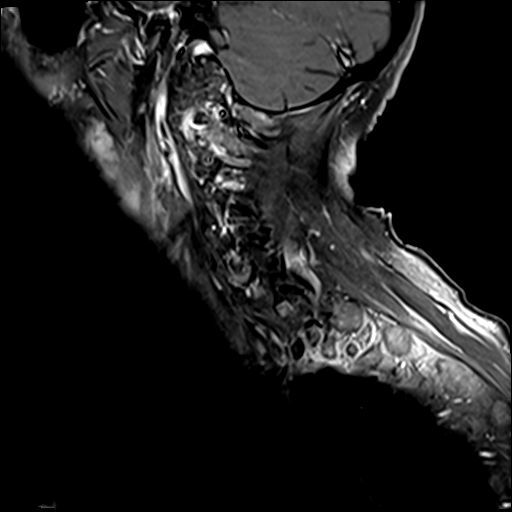
[im 15/15]
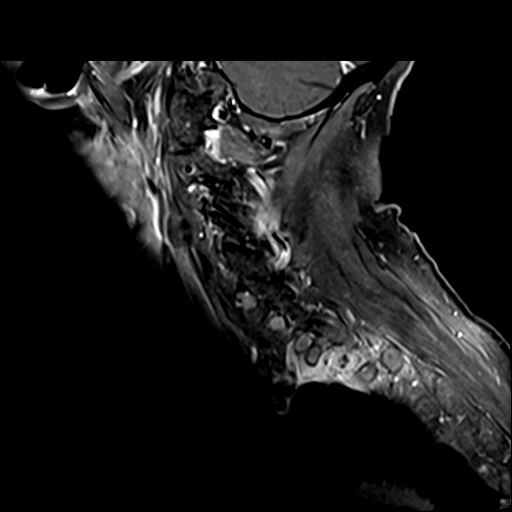

[Series 16: T1 post-contrast · axial · 3.0mm · 0.39mm/px · z∈[-147,-24]mm · 6 of 48 slices shown]
[im 1/48]
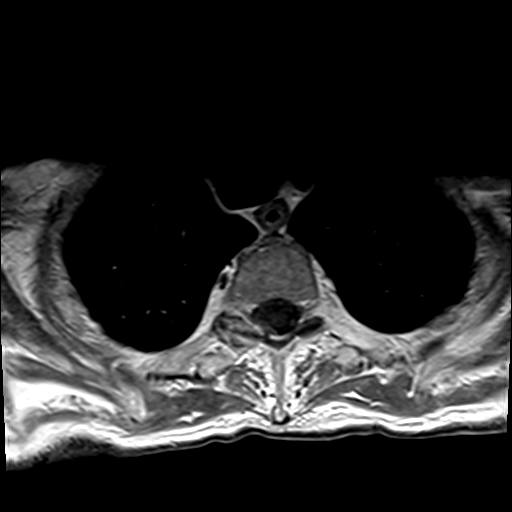
[im 8/48]
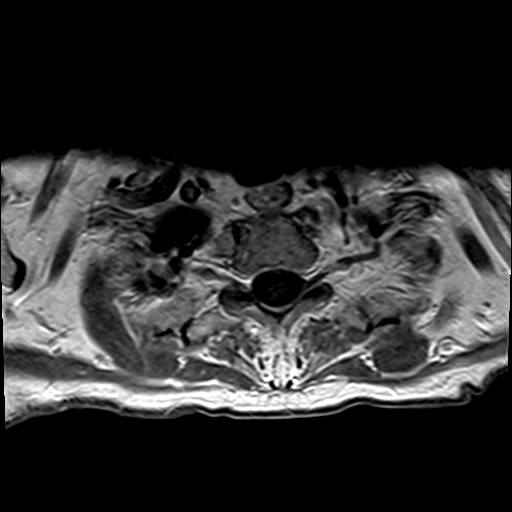
[im 16/48]
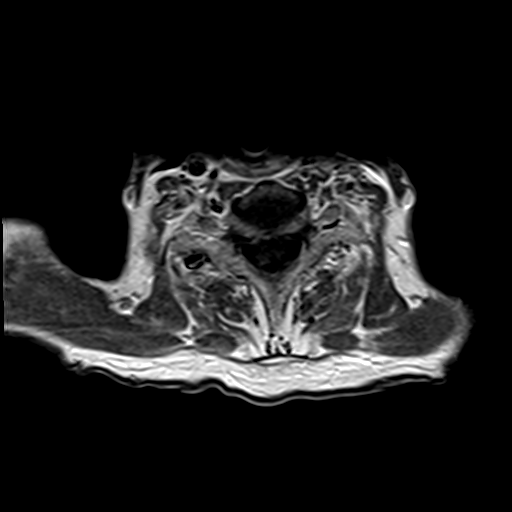
[im 24/48]
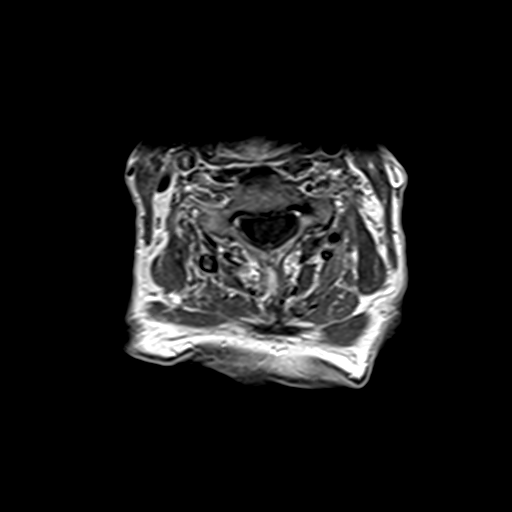
[im 32/48]
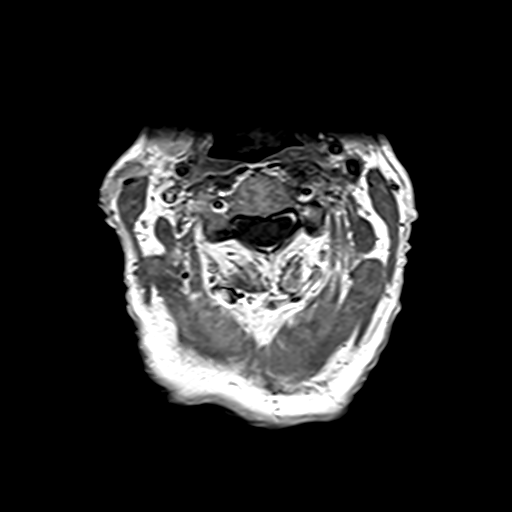
[im 40/48]
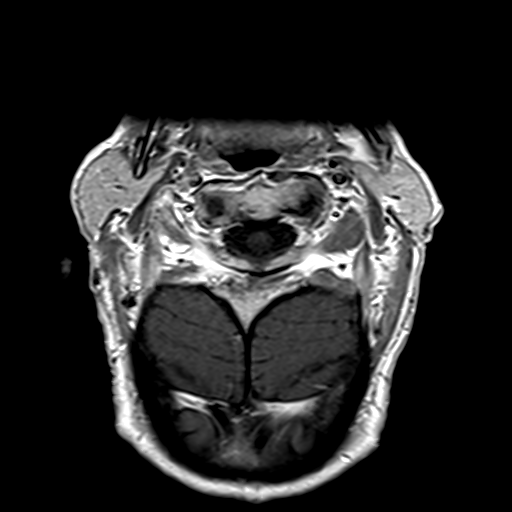

[26 of 48 positions shown; findings below may reference images not displayed]

FINDINGS: Alignment: No significant listhesis.

Vertebrae: Vertebral body heights are maintained. No substantial
marrow edema. No suspicious osseous lesion.

Cord: No abnormal signal.  No abnormal intrathecal enhancement.

Posterior Fossa, vertebral arteries, paraspinal tissues:
Unremarkable. Intracranial structures evaluated separately.

Disc levels:

C2-C3: Punctate central annular fissure. Facet hypertrophy. No canal
or foraminal stenosis.

C3-C4: Disc bulge with endplate osteophytes. Uncovertebral and facet
hypertrophy. Thickening of the ligamentum flavum. Moderate canal
stenosis. Mild to moderate foraminal stenosis, left greater than
right.

C4-C5: Minimal disc bulge. Facet hypertrophy. No canal or foraminal
stenosis.

C5-C6: Disc bulge with endplate osteophytes. Uncovertebral and facet
hypertrophy. Mild canal stenosis. Moderate foraminal stenosis.

C6-C7: Disc bulge with endplate osteophytes. Small superimposed
right paracentral protrusion. Uncovertebral hypertrophy. Minor canal
stenosis. Minor foraminal stenosis.

C7-T1:  Facet hypertrophy.  No canal or foraminal stenosis.
IMPRESSION: No abnormal cord signal or enhancement.

Multilevel degenerative changes as detailed above. Canal stenosis is
greatest at C3-C4. Foraminal narrowing is greatest at C5-C6.

## 2022-01-22 IMAGING — MR MR HEAD W/O CM
12 of 13 series · 44 of 48 positions shown · non-contrast
Comparison: Head CT from yesterday

CLINICAL DATA: Neuro deficit with acute stroke suspected

EXAM:
MRI HEAD WITHOUT CONTRAST
TECHNIQUE: Multiplanar, multiecho pulse sequences of the brain and surrounding
structures were obtained without intravenous contrast.

[Series 5: DWI · axial · 3.0mm · 0.88mm/px · z∈[-24,+114]mm · 8 of 100 slices shown (1 of 4)]
[im 1/100]
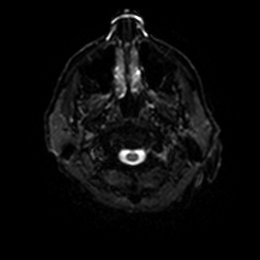
[im 15/100]
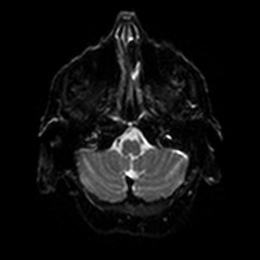
[im 29/100]
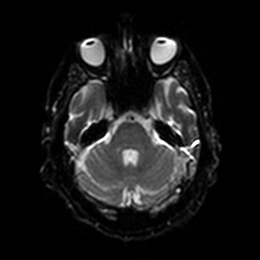
[im 43/100]
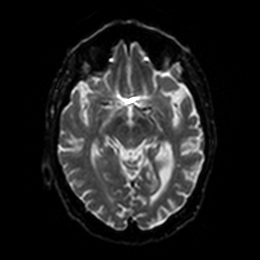
[im 57/100]
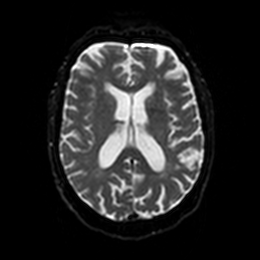
[im 71/100]
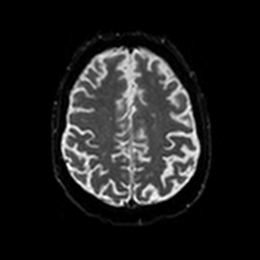
[im 85/100]
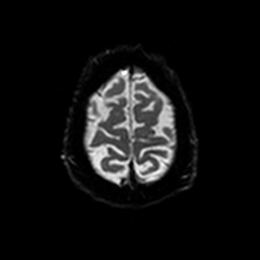
[im 100/100]
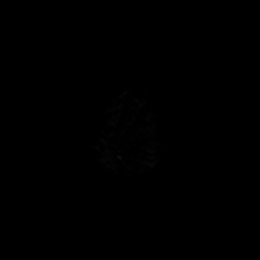

[Series 6: DWI · axial · 3.0mm · 0.88mm/px · z∈[-24,+114]mm · 4 of 50 slices shown (2 of 4)]
[im 1/50]
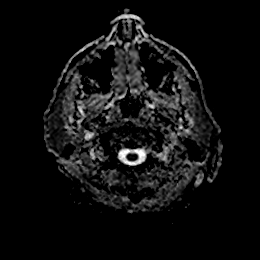
[im 17/50]
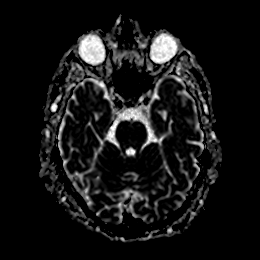
[im 33/50]
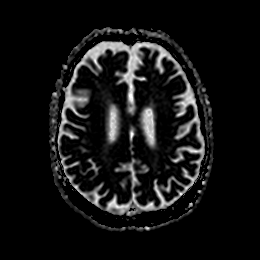
[im 50/50]
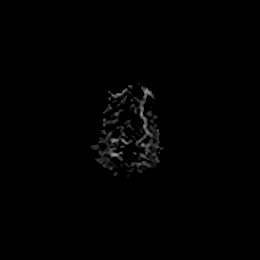

[Series 7: DWI · coronal · 4.0mm · 0.88mm/px · 5 of 72 slices shown (3 of 4)]
[im 1/72]
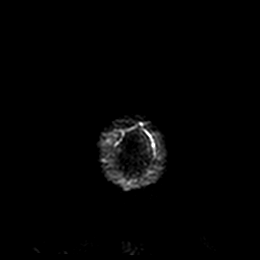
[im 18/72]
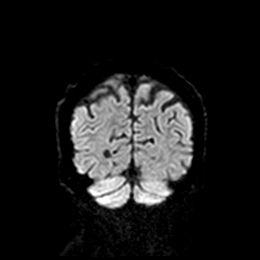
[im 36/72]
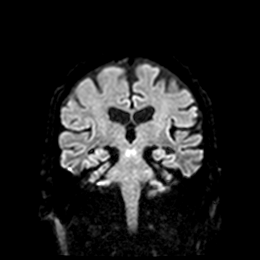
[im 54/72]
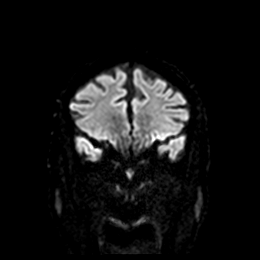
[im 72/72]
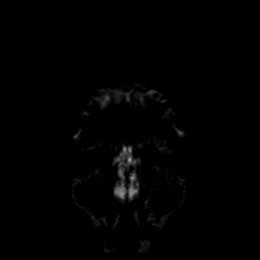

[Series 8: DWI · coronal · 4.0mm · 0.88mm/px · 3 of 36 slices shown (4 of 4)]
[im 1/36]
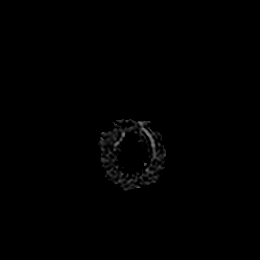
[im 18/36]
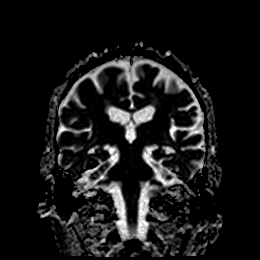
[im 36/36]
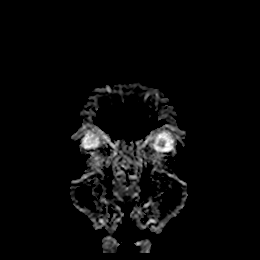

[Series 9: T1 · sagittal · 5.0mm · 0.75mm/px · 2 of 24 slices shown]
[im 1/24]
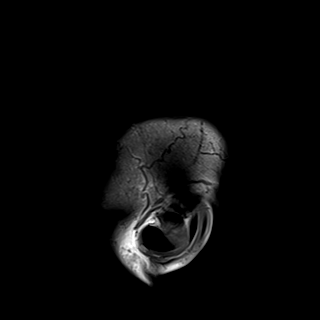
[im 24/24]
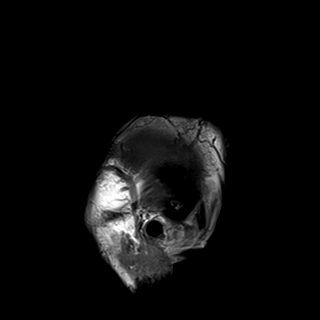

[Series 10: T2 · axial · 5.0mm · 0.72mm/px · z∈[-33,+113]mm · 2 of 27 slices shown (1 of 2)]
[im 1/27]
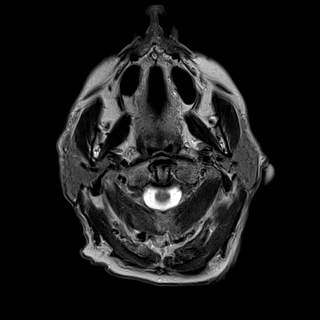
[im 27/27]
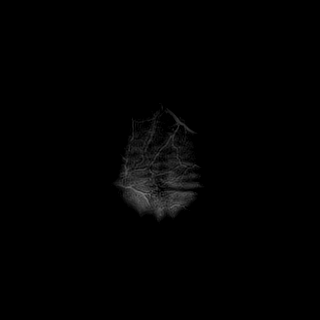

[Series 11: FLAIR · axial · 5.0mm · 0.45mm/px · z∈[-34,+112]mm · 2 of 27 slices shown]
[im 1/27]
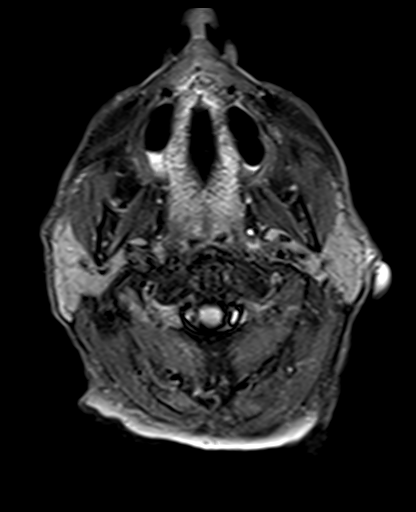
[im 27/27]
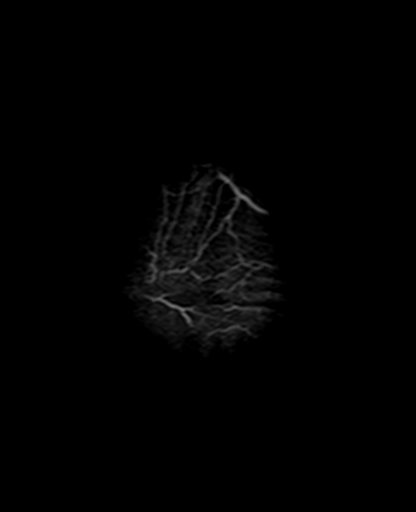

[Series 12: mag_images · axial · 3.0mm · 0.90mm/px · z∈[-39,+116]mm · 4 of 56 slices shown]
[im 1/56]
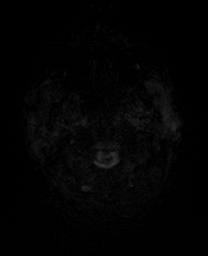
[im 19/56]
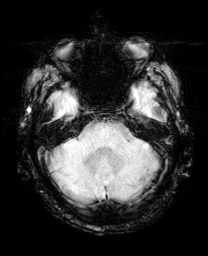
[im 37/56]
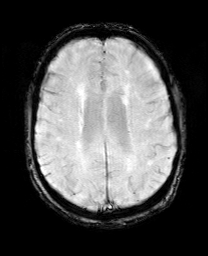
[im 56/56]
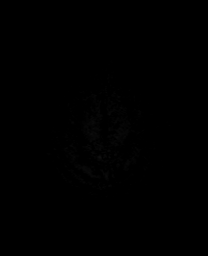

[Series 13: pha_images · axial · 3.0mm · 0.90mm/px · z∈[-39,+116]mm · 4 of 56 slices shown]
[im 1/56]
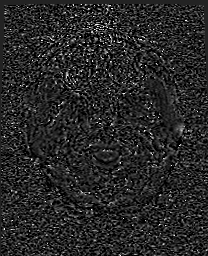
[im 19/56]
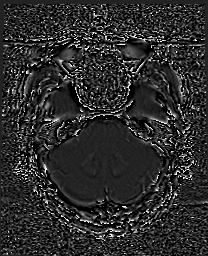
[im 37/56]
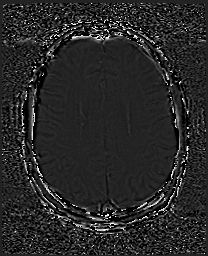
[im 56/56]
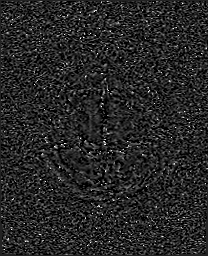

[Series 14: swi_images · axial · 3.0mm · 0.90mm/px · z∈[-39,+116]mm · 4 of 56 slices shown]
[im 1/56]
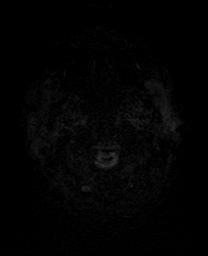
[im 19/56]
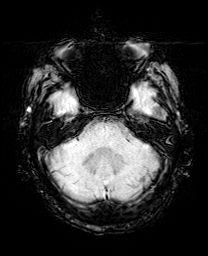
[im 37/56]
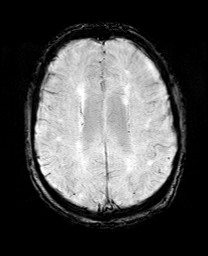
[im 56/56]
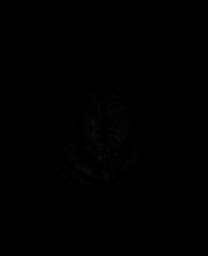

[Series 15: mip_images(sw) · axial · 24.0mm · 0.90mm/px · z∈[-29,+107]mm · 4 of 49 slices shown]
[im 1/49]
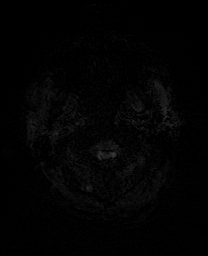
[im 17/49]
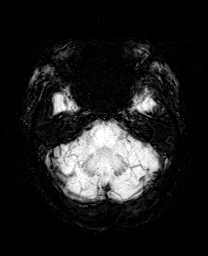
[im 33/49]
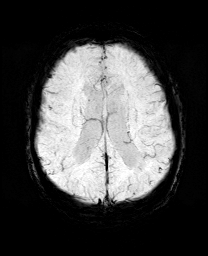
[im 49/49]
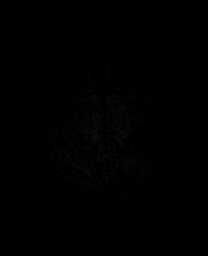

[Series 17: T2 · coronal · 5.0mm · 0.34mm/px · 2 of 30 slices shown (2 of 2)]
[im 1/30]
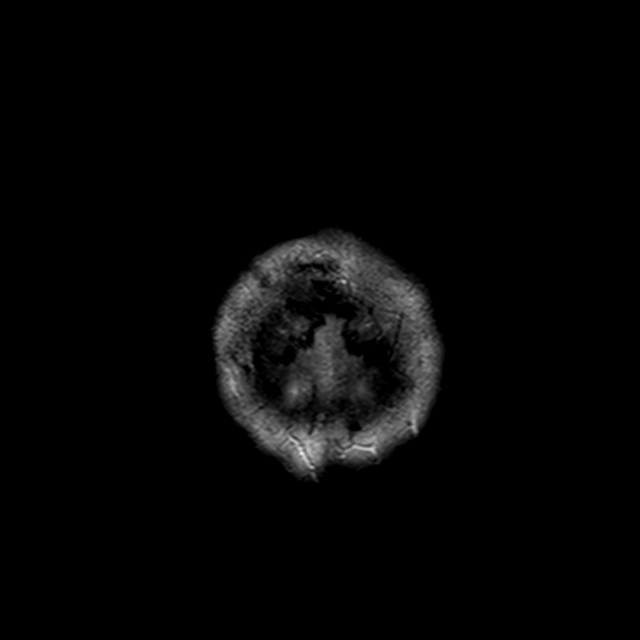
[im 30/30]
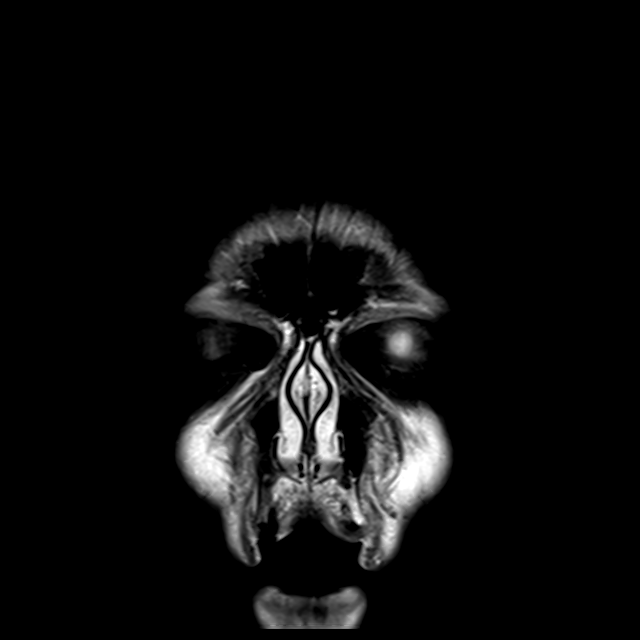

[44 of 48 positions shown; findings below may reference images not displayed]

FINDINGS: Brain: No acute infarction, hemorrhage, hydrocephalus, extra-axial
collection or mass lesion. Mild generalized cerebral volume loss and
chronic small vessel ischemia in the hemispheric white matter.

Vascular: Normal flow voids.

Skull and upper cervical spine: Normal marrow signal.

Sinuses/Orbits: Negative.
IMPRESSION: No acute or reversible finding.

Mild chronic small vessel ischemia.

## 2022-01-22 MED ORDER — THIAMINE HCL 100 MG/ML IJ SOLN: 250.0000 mg | Freq: Every day | INTRAVENOUS | Status: DC

## 2022-01-22 MED ORDER — THIAMINE HCL 100 MG/ML IJ SOLN
100.0000 mg | Freq: Every day | INTRAMUSCULAR | Status: DC
Start: 2022-01-31 — End: 2022-01-23

## 2022-01-22 MED ORDER — CYANOCOBALAMIN 1000 MCG/ML IJ SOLN: 1000.0000 ug | INTRAMUSCULAR | Status: DC

## 2022-01-22 MED ORDER — CYANOCOBALAMIN 1000 MCG/ML IJ SOLN
1000.0000 ug | Freq: Every day | INTRAMUSCULAR | Status: DC
  Administered 2022-01-22 – 2022-01-23 (×2): 1000 ug via INTRAMUSCULAR
  Filled 2022-01-22 (×2): qty 1

## 2022-01-22 MED ORDER — THIAMINE HCL 100 MG/ML IJ SOLN
500.0000 mg | Freq: Three times a day (TID) | INTRAVENOUS | Status: DC
  Administered 2022-01-22 – 2022-01-23 (×2): 500 mg via INTRAVENOUS
  Filled 2022-01-22 (×5): qty 5

## 2022-01-22 MED ORDER — GADOBUTROL 1 MMOL/ML IV SOLN
7.0000 mL | Freq: Once | INTRAVENOUS | Status: AC | PRN
  Administered 2022-01-22: 7 mL via INTRAVENOUS

## 2022-01-22 NOTE — Progress Notes (Signed)
°  Echocardiogram 2D Echocardiogram has been performed.  Johny Chess 01/22/2022, 12:17 PM

## 2022-01-22 NOTE — Plan of Care (Signed)
Pt admitted for AMS, NIHHS score of 4 mostly for arm droop, which is truly from  prior shoulder surgery. Pt A&OX4, no c/o pain. NSR on telemetry. No fluids running. Pt oriented to floor. Pt CHG bath given, bed in lowest position, 2/4 rails up, bed alarm on. No further needs voiced at this time. Reva Bores 01/22/22 3:26 AM

## 2022-01-22 NOTE — Progress Notes (Signed)
PROGRESS NOTE    [name]  CVE:[id number] DOB: 06-08-1961 DOA: 01/21/2022 PCP: Pcp, No   Chief Complaint  Patient presents with   Altered Mental Status    Brief Narrative:     [name] is a 61 y.o. male with medical history significant of GERD, seasonal allergies.  Patient seen for proximal weakness.  History is obtained from patient and by the patient's boss who was present during the interview by the patient's request.  The patient has speech difficulties with extreme stuttering that includes nonsensical words, which makes understanding him very difficult.  I did ask the patient's boss, [name], whether this is new and she indicated that this is a chronic problem and something that has existed for several years.  The patient did not show up for work yesterday, which is extremely unusual.  The patient called [name] this morning and asked her for a ride as he was having difficulty with his arms.  He mentioned to her that he thought he had a stroke and she immediately told him to call EMS and come to the hospital for evaluation.  The patient describes some heaviness in his upper arms, making it difficulty for him to lift and move his arms.  He does have fairly good use of his forearms and hands.  It does appear that the use of his right forearm is quite limited, as he uses his left hand to pick it up some.  He also admits to having a left frontal headache, onset of which she is difficult to determine.  Denies fevers, chills, nausea, vomiting.  He does have diminished appetite at baseline.  Assessment & Plan:   Principal Problem:   Proximal weakness of limb   Significant proximal upper extremity and greater weakness ending for myopathic process. -Agement per neurology, MRI brain with, without contrast has been obtained with no acute findings, -CK and aldolase still pending -2D echo still pending -We will await further recommendation regarding muscle biopsy from neurology -Follow on  vitamin E,Copper and Aldolase, TSH and folic acid within normal limit  B12 deficiency -B12 significantly low at 179, will start on IM supplements.  GERD -Continue with PPI     DVT prophylaxis: Lovenox Code Status: Full Family Communication: None at bedside Disposition:   Status is: Inpatient Remains inpatient appropriate because: further work up pending           Consultants:  - Neurology   Subjective:  No significant events as discussed with staff, patient denies any complaints. Objective: Vitals:   01/21/22 2215 01/21/22 2345 01/22/22 0315 01/22/22 0800  BP: 122/85 107/83 99/62 117/80  Pulse: (!) 55 86 (!) 52 61  Resp: 15 14 17 19   Temp:  98.9 F (37.2 C) 98.8 F (37.1 C) 97.6 F (36.4 C)  TempSrc:  Oral Oral Oral  SpO2: 100% 96% 97% 95%  Weight:  68 kg    Height:        Intake/Output Summary (Last 24 hours) at 01/22/2022 1208 Last data filed at 01/22/2022 0800 Gross per 24 hour  Intake 360 ml  Output --  Net 360 ml   Filed Weights   01/21/22 1327 01/21/22 2345  Weight: 68 kg 68 kg    Examination:  Awake Alert, appearing, frail, with significant dysarthria, and significant proximal weakness in both upper and lower extremities. RRR,No Gallops,Rubs or new Murmurs, No Parasternal Heave +ve B.Sounds, Abd Soft, No tenderness, No rebound - guarding or rigidity. No Cyanosis, Clubbing or edema,  No new Rash or bruise      Data Reviewed: I have personally reviewed following labs and imaging studies  CBC: Recent Labs  Lab 01/21/22 1402  WBC 9.5  NEUTROABS 6.4  HGB 15.1  HCT 47.5  MCV 94.4  PLT 254    Basic Metabolic Panel: Recent Labs  Lab 01/21/22 1402  NA 139  K 4.1  CL 106  CO2 27  GLUCOSE 101*  BUN 12  CREATININE 0.74  CALCIUM 9.3    GFR: Estimated Creatinine Clearance: 92.1 mL/min (by C-G formula based on SCr of 0.74 mg/dL).  Liver Function Tests: Recent Labs  Lab 01/21/22 1402  AST 25  ALT 22  ALKPHOS 54  BILITOT  1.1  PROT 7.0  ALBUMIN 4.1    CBG: No results for input(s): GLUCAP in the last 168 hours.   Recent Results (from the past 240 hour(s))  Resp Panel by RT-PCR (Flu A&B, Covid) Nasopharyngeal Swab     Status: None   Collection Time: 01/21/22  1:54 PM   Specimen: Nasopharyngeal Swab; Nasopharyngeal(NP) swabs in vial transport medium  Result Value Ref Range Status   SARS Coronavirus 2 by RT PCR NEGATIVE NEGATIVE Final    Comment: (NOTE) SARS-CoV-2 target nucleic acids are NOT DETECTED.  The SARS-CoV-2 RNA is generally detectable in upper respiratory specimens during the acute phase of infection. The lowest concentration of SARS-CoV-2 viral copies this assay can detect is 138 copies/mL. A negative result does not preclude SARS-Cov-2 infection and should not be used as the sole basis for treatment or other patient management decisions. A negative result may occur with  improper specimen collection/handling, submission of specimen other than nasopharyngeal swab, presence of viral mutation(s) within the areas targeted by this assay, and inadequate number of viral copies(<138 copies/mL). A negative result must be combined with clinical observations, patient history, and epidemiological information. The expected result is Negative.  Fact Sheet for Patients:  BloggerCourse.com  Fact Sheet for Healthcare Providers:  SeriousBroker.it  This test is no t yet approved or cleared by the [place] FDA and  has been authorized for detection and/or diagnosis of SARS-CoV-2 by FDA under an Emergency Use Authorization (EUA). This EUA will remain  in effect (meaning this test can be used) for the duration of the COVID-19 declaration under Section 564(b)(1) of the Act, 21 U.S.C.section 360bbb-3(b)(1), unless the authorization is terminated  or revoked sooner.       Influenza A by PCR NEGATIVE NEGATIVE Final   Influenza B by PCR NEGATIVE  NEGATIVE Final    Comment: (NOTE) The Xpert Xpress SARS-CoV-2/FLU/RSV plus assay is intended as an aid in the diagnosis of influenza from Nasopharyngeal swab specimens and should not be used as a sole basis for treatment. Nasal washings and aspirates are unacceptable for Xpert Xpress SARS-CoV-2/FLU/RSV testing.  Fact Sheet for Patients: BloggerCourse.com  Fact Sheet for Healthcare Providers: SeriousBroker.it  This test is not yet approved or cleared by the [place] FDA and has been authorized for detection and/or diagnosis of SARS-CoV-2 by FDA under an Emergency Use Authorization (EUA). This EUA will remain in effect (meaning this test can be used) for the duration of the COVID-19 declaration under Section 564(b)(1) of the Act, 21 U.S.C. section 360bbb-3(b)(1), unless the authorization is terminated or revoked.  Performed at [facility], [address], [place], [place] [address]          Radiology Studies: CT HEAD WO CONTRAST ( )  Result Date: 01/21/2022 CLINICAL DATA:  Neuro  deficit, acute, stroke suspected EXAM: CT HEAD WITHOUT CONTRAST TECHNIQUE: Contiguous axial images were obtained from the base of the skull through the vertex without intravenous contrast. RADIATION DOSE REDUCTION: This exam was performed according to the departmental dose-optimization program which includes automated exposure control, adjustment of the mA and/or kV according to patient size and/or use of iterative reconstruction technique. COMPARISON:  None. FINDINGS: Brain: There is no acute intracranial hemorrhage, mass effect, or edema. Gray-white differentiation is preserved. There is no extra-axial fluid collection. Prominence of the ventricles and sulci reflects minor parenchymal volume loss. Patchy low-density in the supratentorial white matter is nonspecific but may reflect mild chronic microvascular ischemic changes. Vascular: No hyperdense  vessel or unexpected calcification. Skull: Calvarium is unremarkable. Sinuses/Orbits: No acute finding. Other: None. IMPRESSION: No acute intracranial abnormality. Mild chronic microvascular ischemic changes. Electronically Signed   By: Guadlupe Spanish M.D.   On: 01/21/2022 14:35   MR BRAIN WO CONTRAST  Result Date: 01/22/2022 CLINICAL DATA:  Neuro deficit with acute stroke suspected EXAM: MRI HEAD WITHOUT CONTRAST TECHNIQUE: Multiplanar, multiecho pulse sequences of the brain and surrounding structures were obtained without intravenous contrast. COMPARISON:  Head CT from yesterday FINDINGS: Brain: No acute infarction, hemorrhage, hydrocephalus, extra-axial collection or mass lesion. Mild generalized cerebral volume loss and chronic small vessel ischemia in the hemispheric white matter. Vascular: Normal flow voids. Skull and upper cervical spine: Normal marrow signal. Sinuses/Orbits: Negative. IMPRESSION: No acute or reversible finding. Mild chronic small vessel ischemia. Electronically Signed   By: Tiburcio Pea M.D.   On: 01/22/2022 09:33        Scheduled Meds:   stroke: mapping our early stages of recovery book   Does not apply Once   dorzolamide-timolol  1 drop Right Eye BID   enoxaparin (LOVENOX) injection  40 mg Subcutaneous Q24H   latanoprost  1 drop Right Eye QHS   pantoprazole  40 mg Oral Daily   Continuous Infusions:   LOS: 1 day      Bruce Bienenstock, MD [facility]   To contact the attending provider between 7A-7P or the covering provider during after hours 7P-7A, please log into the web site [email or web address] and access using universal Farmer password for that web site. If you do not have the password, please call the hospital operator.  01/22/2022, 12:08 PM

## 2022-01-22 NOTE — Progress Notes (Signed)
SLP Cancellation Note  Patient Details Name: DASHTON CZERWINSKI MRN: [id number] DOB: [birth year]   Cancelled treatment:       Reason Eval/Treat Not Completed: Patient at procedure or test/unavailable   Angela Nevin, MA, CCC-SLP Speech Therapy

## 2022-01-22 NOTE — Evaluation (Signed)
Physical Therapy Evaluation Patient Details Name: [name] MRN: [id number] DOB: May 08, 1961 Today's Date: 01/22/2022  History of Present Illness  [name] is a 61 y.o. male who presented with proximal BUE weakness, memory issues and and sluttering speech, symptoms have been going on for a few months. Pt does not have significant past medical history   Clinical Impression  Pt admitted with above. Pt with noted truncal weakness in addition to bilat shoulder weakness and inability to use bilat shoulders functionally. Pt undergoing medical work up. At this time pt unable to safely stay at home due to increased fall risk and inability to safely care for self at this time. Pt able to ambulate however has a significant trunk extension, hanging on bilat Y ligaments/anterior pelvis to gain stability. Pt with no family as he reports they have all passed away. 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 Acute PT to cont to follow patient and re-assess d/c recommendations as medical work up continues.       Recommendations for follow up therapy are one component of a multi-disciplinary discharge planning process, led by the attending physician.  Recommendations may be updated based on patient status, additional functional criteria and insurance authorization.  Follow Up Recommendations Home health PT (unsure of pt diagnosis/prognosis, awaiting medical findings, will re-assess in upcoming sessions)    Assistance Recommended at Discharge Frequent or constant Supervision/Assistance (unsafe to be home alone at this time with inability to use bilat shlders functionally)  Patient can return home with the following  A little help with bathing/dressing/bathroom;Assistance with cooking/housework;Direct supervision/assist for medications management;Direct supervision/assist for financial management;Assist for transportation    Equipment Recommendations None recommended by PT  Recommendations for  Other Services       Functional Status Assessment Patient has had a recent decline in their functional status and demonstrates the ability to make significant improvements in function in a reasonable and predictable amount of time.     Precautions / Restrictions Precautions Precautions: Fall Restrictions Weight Bearing Restrictions: No      Mobility  Bed Mobility Overal bed mobility: Needs Assistance Bed Mobility: Supine to Sit     Supine to sit: Supervision     General bed mobility comments: HOB elevated, pt able to bring self to EOB without difficulty    Transfers Overall transfer level: Needs assistance Equipment used: None Transfers: Sit to/from Stand Sit to Stand: Min guard           General transfer comment: min guard for safety, pt with immeadiate posterior lean at trunk with wide base of support    Ambulation/Gait Ambulation/Gait assistance: Min guard Gait Distance (Feet): 120 Feet Assistive device: None Gait Pattern/deviations: Step-through pattern, Decreased stride length, Wide base of support, Knee flexed in stance - right, Knee flexed in stance - left Gait velocity: wfl Gait velocity interpretation: 1.31 - 2.62 ft/sec, indicative of limited community ambulator   General Gait Details: pt with significant back extension, relyig on anterior pelvis, pt can correct but then pt very kyphotic with extremely forward head and is unable to maintain that posture >30 sec, pt with noted truncal weakness  Stairs            Wheelchair Mobility    Modified Rankin (Stroke Patients Only)       Balance Overall balance assessment: Mild deficits observed, not formally tested  Pertinent Vitals/Pain Pain Assessment Pain Assessment: Faces Faces Pain Scale: No hurt Pain Intervention(s): Monitored during session    Home Living Family/patient expects to be discharged to:: Private residence Living  Arrangements: Alone Available Help at Discharge: Friend(s);Available PRN/intermittently Type of Home: Mobile home Home Access: Stairs to enter   Entrance Stairs-Number of Steps: 3-4   Home Layout: One level Home Equipment: None      Prior Function Prior Level of Function : Working/employed;Needs assist             Mobility Comments: no AD 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, manages microwave meals, does not drive     Hand Dominance   Dominant Hand: Right    Extremity/Trunk Assessment   Upper Extremity Assessment Upper Extremity Assessment: LUE deficits/detail;RUE deficits/detail RUE Deficits / Details: very limited active shld ROM, limited to around 60 deg passive, noted tightness, able to use hands and elbows without difficulty LUE Deficits / Details: very limited active shld ROM, limited to around 60 deg passive, noted tightness, able to use hands and elbows without difficulty    Lower Extremity Assessment Lower Extremity Assessment: Generalized weakness    Cervical / Trunk Assessment Cervical / Trunk Assessment: Kyphotic (has posterior lean, hands out on anterior pelvis)  Communication   Communication: Expressive difficulties  Cognition Arousal/Alertness: Awake/alert Behavior During Therapy: Restless Overall Cognitive Status: Difficult to assess                                 General Comments: A&Ox4 with increased time. Follow simple commands, required cues for multi-step cues.        General Comments General comments (skin integrity, edema, etc.): pt with some readness/irritation around face    Exercises     Assessment/Plan    PT Assessment Patient needs continued PT services  PT Problem List Decreased strength;Decreased activity tolerance;Decreased balance;Decreased mobility       PT Treatment Interventions DME instruction;Stair training;Functional mobility training;Therapeutic activities;Therapeutic exercise;Balance training     PT Goals (Current goals can be found in the Care Plan section)  Acute Rehab PT Goals Patient Stated Goal: get better PT Goal Formulation: With patient Time For Goal Achievement: 02/05/22 Potential to Achieve Goals: Fair    Frequency Min 3X/week     Co-evaluation               AM-PAC PT "6 Clicks" Mobility  Outcome Measure Help needed turning from your back to your side while in a flat bed without using bedrails?: None Help needed moving from lying on your back to sitting on the side of a flat bed without using bedrails?: None Help needed moving to and from a bed to a chair (including a wheelchair)?: A Little Help needed standing up from a chair using your arms (e.g., wheelchair or bedside chair)?: A Little Help needed to walk in hospital room?: A Little Help needed climbing 3-5 steps with a railing? : A Lot 6 Click Score: 19    End of Session Equipment Utilized During Treatment: Gait belt Activity Tolerance: Patient tolerated treatment well Patient left: in chair;with chair alarm set;with call bell/phone within reach Nurse Communication: Mobility status (present for amb) PT Visit Diagnosis: Unsteadiness on feet (R26.81);Muscle weakness (generalized) (M62.81)    Time: 8768-1157 PT Time Calculation (min) (ACUTE ONLY): 16 min   Charges:   PT Evaluation $PT Eval Moderate Complexity: 1 Mod  Lewis Shock, PT, DPT Acute Rehabilitation Services Pager #: [phone] Office #: [phone]   Iona Hansen 01/22/2022, 1:31 PM

## 2022-01-22 NOTE — Progress Notes (Signed)
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 He lives alone in a mobile home, and his employer assists with IADLs as needed. Upon evaluation pt requires min guard - supervision for all mobility and up to mod A for ADLs. He is severely limited in bilat shoulder ROM, and has expressive communication impairment. He will benefit from OT acutely. Recommend d/c to home with [facility] pending medical work up; this date all scans are negative. If pt has an admitting dx, he will be appropriate for CIR level therapy.     01/22/22 1100  OT Visit Information  Last OT Received On 01/22/22  Assistance Needed +1  History of Present Illness Bruce HEDMAN is a 61 y.o. male who presented with proximal BUE weakness, memory issues and and sluttering speech, symptoms have been going on for a few months. Pt does not have significant past medical history  Precautions  Precautions Fall  Restrictions  Weight Bearing Restrictions No  Home Living  Family/patient expects to be discharged to: Private residence  Living Arrangements Alone  Available Help at Discharge Friend(s);Available PRN/intermittently  Type of Home Mobile home  Home Access Stairs to enter  Entrance Stairs-Number of Steps 3-4  Home Layout One level  Bathroom Shower/Tub Tub/shower unit;Sponge bathes at baseline  [employer] None  Prior Function  Prior Level of Function  Working/employed;Needs assist  Mobility Comments no AD  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  Follow simple commands, required cues for multi-step cues.  Difficult to assess due to Impaired communication  Upper Extremity Assessment  Upper Extremity Assessment RUE deficits/detail;LUE deficits/detail  RUE Deficits / Details extremely limited shoulder ROM, can moev ~10* actively, ~90 passively. Elbow wrist and hand ROM is WFL, and MMT is generally 4/5. some numbness and tingling in hands.  RUE Sensation decreased light touch  RUE Coordination decreased gross motor  LUE Deficits / Details extremely limited shoulder ROM, can moev ~10* actively, ~90 passively. Elbow wrist and hand ROM is WFL, and MMT is generally 4/5. some numbness and tingling in hands.  LUE Sensation decreased light touch  LUE Coordination decreased gross motor  Lower Extremity Assessment  Lower Extremity Assessment Defer to PT evaluation  Cervical / Trunk Assessment  Cervical / Trunk Assessment Kyphotic  Vision- History  Baseline Vision/History 0 No visual deficits  Ability to See in Adequate Light 0 Adequate  Patient Visual Report No change from baseline  Vision- Assessment  Vision Assessment? No apparent visual deficits  Perception  Perception Tested? No  Praxis  Praxis tested? Not tested  ADL  Overall ADL's  Needs assistance/impaired  Eating/Feeding Sitting;Set up  Eating/Feeding Details (indicate cue type and reason) must be set up at the right height or pt is unable to reach food  Grooming Min guard;Standing  Upper Body Bathing Moderate assistance;Sitting  Lower Body Bathing Moderate assistance;Sit to/from stand  Upper Body Dressing  Minimal assistance;Sitting  Lower Body Dressing Moderate assistance;Sit to/from Environmental education officer guard;Ambulation  Toileting- Water quality scientist and Hygiene Min guard;Sitting/lateral lean  Functional mobility during ADLs Min  guard  General ADL Comments limited by poor bilat shoudler ROM  Bed Mobility  Overal bed mobility Needs Assistance  Bed Mobility Supine  to Sit  Supine to sit Supervision  Transfers  Overall transfer level Needs assistance  Equipment used Rolling walker (2 wheels)  Transfers Sit to/from Stand  Sit to Stand Supervision  General Comments  General comments (skin integrity, edema, etc.) VSS on RA  OT - End of Session  Equipment Utilized During Treatment Rolling walker (2 wheels)  Activity Tolerance Patient tolerated treatment well  Patient left in chair;with call bell/phone within reach;with chair alarm set  Nurse Communication Mobility status  OT Assessment  OT Recommendation/Assessment Patient needs continued OT Services  OT Visit Diagnosis Other abnormalities of gait and mobility (R26.89);Muscle weakness (generalized) (M62.81)  OT Problem List Decreased strength;Decreased range of motion;Decreased activity tolerance;Impaired balance (sitting and/or standing);Decreased coordination;Decreased safety awareness  OT Plan  OT Frequency (ACUTE ONLY) Min 2X/week  OT Treatment/Interventions (ACUTE ONLY) Self-care/ADL training;Therapeutic exercise;Balance training;Patient/family education;Therapeutic activities;DME and/or AE instruction  AM-PAC OT "6 Clicks" Daily Activity Outcome Measure (Version 2)  Help from another person eating meals? 3  Help from another person taking care of personal grooming? 3  Help from another person toileting, which includes using toliet, bedpan, or urinal? 3  Help from another person bathing (including washing, rinsing, drying)? 2  Help from another person to put on and taking off regular upper body clothing? 3  Help from another person to put on and taking off regular lower body clothing? 2  6 Click Score 16  Progressive Mobility  What is the highest level of mobility based on the progressive mobility assessment? Level 5 (Walks with assist in room/hall) - Balance while stepping forward/back and can walk in room with assist - Complete  Activity Ambulated with assistance in room  OT Recommendation   Follow Up Recommendations Home health OT  Assistance recommended at discharge Intermittent Supervision/Assistance  Patient can return home with the following A little help with walking and/or transfers;A little help with bathing/dressing/bathroom;Assist for transportation;Help with stairs or ramp for entrance  Functional Status Assessent Patient has had a recent decline in their functional status and demonstrates the ability to make significant improvements in function in a reasonable and predictable amount of time.  OT Equipment None recommended by OT  Individuals Consulted  Consulted and Agree with Results and Recommendations Patient  Acute Rehab OT Goals  Patient Stated Goal did not state  OT Goal Formulation With patient  Time For Goal Achievement 02/05/22  Potential to Achieve Goals Good  OT Time Calculation  OT Start Time (ACUTE ONLY) 1102  OT Stop Time (ACUTE ONLY) 1122  OT Time Calculation (min) 20 min  OT General Charges  $OT Visit 1 Visit  OT Evaluation  $OT Eval Moderate Complexity 1 Mod  Written Expression  Dominant Hand Right

## 2022-01-22 NOTE — Consult Note (Addendum)
Neurology Consultation  Reason for Consult: Altered mental status, proximal weakness Referring Physician: Dr. Gust RungStenson  CC: Initial consult called in for altered mental status to the on-call neurologist but review of chart and patient interview reveals the chief complaint as bilateral shoulder weakness and memory issues as the chief complaint.  History is obtained from: Patient-not a very reliable historian, chart  HPI: [name] is a 61 y.o. male with no significant past medical history who came in for evaluation at the emergency room at [facility] for symptoms that have now been ongoing for few months. He describes his most pressing issue and complained of being not able to use his arms.  He reports significant weakness in both his shoulders and cannot lift his arms from the side of his body up volitionally but has involuntary drawing up of his arms that has been going on for months.  He still has decent strength according to him on his forearms but cannot flex or extend his shoulder.  He says that this has been going on for about 2 months if not more.  He also reports that he has been having trouble with his memory that has been going on for more than half a year.  He works at a [employer] and has worked there for 28 years.  Denies any sudden onset of focal neurological deficits but reports that the symptoms have been ongoing for months.  He reports that his speech has become very stuttering and quality and he has had difficulty talking.  He has lost significant amount of weight over the past 6 to 8 months-could not quantify but said it was significant. When asked about his dietary habits, he usually eats a corn dog and coke every day and has had not much of an appetite over the past few months. He reports that he is stable able to get out of bed and has significant strength in his legs but he also feels some weakness on his hips and knees but not as bad as the weakness on his  shoulders. No tingling or numbness. Reports a headache that has also been going on for a few months which she describes as a pain that is in his left forehead going to the left temple and is very frequent and lasts for variable period of time for few minutes to a few days. There was no family member or friend at bedside to corroborate this history.  Initial consult to the on-call neurologist was made for altered mental status for 2 days and based on the story initially provided, recommendation was [facility] and MRI-both of which I still feel are important but he needs some further work-up more than just imaging and we would need more history from his family/friends regarding the course of his problems.  ROS: Denies fevers chills chest pain shortness of breath.  He is not a very reliable historian-that is the caveat.  Past Medical History:  Diagnosis Date   Clavicle fracture 07/27/2015   Eosinophilic esophagitis 2008   Previously treated with systemic corticosteroid therapy as the patient could not afford Flovent at that time. Patient was referred to the allergist but did not go to the appointment.   GERD (gastroesophageal reflux disease)    Seasonal allergies         Family History  Problem Relation Age of Onset   Cancer Mother    Alcohol abuse Brother    Colon  cancer Neg Hx    Liver disease Neg Hx      Social History:   reports that he quit smoking about 42 years ago. He has a 5.00 pack-year smoking history. He has quit using smokeless tobacco. He reports that he does not drink alcohol and does not use drugs.  Medications  Current Facility-Administered Medications:     stroke: mapping our early stages of recovery book, , Does not apply, Once, Levie Heritage, DO   acetaminophen (TYLENOL) tablet 650 mg, 650 mg, Oral, Q4H PRN **OR** acetaminophen (TYLENOL) 160 MG/5ML solution 650 mg, 650 mg, Per Tube, Q4H PRN **OR** acetaminophen (TYLENOL)  suppository 650 mg, 650 mg, Rectal, Q4H PRN, Stinson, Jacob J, DO   dorzolamide-timolol (COSOPT) 22.3-6.8 MG/ML ophthalmic solution 1 drop, 1 drop, Right Eye, BID, Stinson, Jacob J, DO   enoxaparin (LOVENOX) injection 40 mg, 40 mg, Subcutaneous, Q24H, Stinson, Jacob J, DO, 40 mg at 01/21/22 1752   ibuprofen (ADVIL) tablet 600 mg, 600 mg, Oral, Q8H PRN, Stinson, Jacob J, DO   latanoprost (XALATAN) 0.005 % ophthalmic solution 1 drop, 1 drop, Right Eye, QHS, Stinson, Jacob J, DO   pantoprazole (PROTONIX) EC tablet 40 mg, 40 mg, Oral, Daily, Levie Heritage, DO, 40 mg at 01/21/22 1752   Exam: Current vital signs: BP 107/83 (BP Location: Left Arm)    Pulse 86    Temp 98.9 F (37.2 C) (Oral)    Resp 14    Ht 5\' 9"  (1.753 m)    Wt 68 kg    SpO2 96%    BMI 22.14 kg/m  Vital signs in last 24 hours: Temp:  [98.9 F (37.2 C)-99 F (37.2 C)] 98.9 F (37.2 C) (02/25 2345) Pulse Rate:  [39-96] 86 (02/25 2345) Resp:  [12-29] 14 (02/25 2345) BP: (99-161)/(65-134) 107/83 (02/25 2345) SpO2:  [96 %-100 %] 96 % (02/25 2345) Weight:  [68 kg] 68 kg (02/25 2345) General: Thin appearing man, awake alert in no distress HEENT: Normocephalic, atraumatic, he has some raised rashes on his face but those are not new, left pupil is opacified CVs: Regular rhythm and rate Chest: Clear Abdomen nondistended nontender Extremities warm well perfused without edema Neurological exam Awake alert oriented to self. When asked what month and year it is, he said initially for March 1923 and then corrected to 2023 on saying it is 2023.  He could not tell me the correct month even after saying that March is not the correct option. He could not tell me his correct age-said he is 61 years old when he is 61 years old.  He also could not tell me his correct date of birth. His speech has a stuttering quality to it along with frank dysarthria. He has no evidence of aphasia His attention concentration is somewhat poor. He is able  to register 3 words but not able to recall at 5 minutes. Cranial nerves: Right pupil is round reactive to light, left pupil has corneal opacity, extraocular movements intact, visual fields full, facial sensation intact, face symmetric, tongue and palate midline. Motor examination: He has a significant amount of weakness on shoulder abduction where he is not able to initiate shoulder abduction and on passively abducting his shoulder he is barely able to hold it antigravity.  Elbow flexion and extension are also weak bilaterally 4 -/5.  Grip strength is 4+/5.  Small muscles of the finger both abduction and adduction is grossly 4+/5.  Findings have no left-right asymmetry. Lower extremity examination with  4-/5 hip flexion and extension.  4+/5 knee flexion extension and 4+/5 ankle dorsiflexion plantarflexion. Tone: He has somewhat of an increased tone in both upper extremities and finds it hard to relax. Sensation intact Coordination with no dysmetria but limited exam due to proximal weakness up in the upper extremities. DTRs: Very difficult to elicit DTRs in his upper extremities.  There are 2+ symmetric reflexes in the knees and ankles bilaterally.  It is very difficult to elicit his plantar reflex given the withdrawal and the fact that he stiffens his feet.  On percussion of his thenar muscles, there is question of subtle myotonia.  He also has a hard time releasing his grip fast when I ask him to Squeeze my fingers and let them go rapidly.  Labs I have reviewed labs in epic and the results pertinent to this consultation are:  CBC    Component Value Date/Time   WBC 9.5 01/21/2022 1402   RBC 5.03 01/21/2022 1402   HGB 15.1 01/21/2022 1402   HCT 47.5 01/21/2022 1402   PLT 254 01/21/2022 1402   MCV 94.4 01/21/2022 1402   MCH 30.0 01/21/2022 1402   MCHC 31.8 01/21/2022 1402   RDW 12.2 01/21/2022 1402   LYMPHSABS 2.1 01/21/2022 1402   MONOABS 0.8 01/21/2022 1402   EOSABS 0.1 01/21/2022 1402    BASOSABS 0.1 01/21/2022 1402    CMP     Component Value Date/Time   NA 139 01/21/2022 1402   K 4.1 01/21/2022 1402   CL 106 01/21/2022 1402   CO2 27 01/21/2022 1402   GLUCOSE 101 (H) 01/21/2022 1402   BUN 12 01/21/2022 1402   CREATININE 0.74 01/21/2022 1402   CALCIUM 9.3 01/21/2022 1402   PROT 7.0 01/21/2022 1402   ALBUMIN 4.1 01/21/2022 1402   AST 25 01/21/2022 1402   ALT 22 01/21/2022 1402   ALKPHOS 54 01/21/2022 1402   BILITOT 1.1 01/21/2022 1402   GFRNONAA >60 01/21/2022 1402   Imaging I have reviewed the images obtained: CT-head:  MRI examination of the brain  Assessment:  61 year old man presenting for evaluation of a few months worth of multiple complaints that include proximal muscle weakness worse in the upper extremities then lower extremities and cognitive deficits along with stuttering and slurred speech. On examination he has some subtle evidence of myotonia evidenced by difficulty releasing his grip and also significant proximal weakness in cape like pattern in the upper extremities and some girdle weakness in the lower extremities.  He also has left eye cataract/lenticular opacity. Also has been complaining of cognitive complaints-especially with short-term memory that has also been ongoing for a few months.  Has had weight loss which has been unexplained as well.  At this point, without further work-up, the differential is broad which includes  His exam is suggestive of some sort of a myopathic process such as myotonic dystrophy question myotonic dystrophy type II given he is in the sixth decade of life.  Other differentials to consider are nutritional, endocrine and autoimmune causes.  Impression: -Significant proximal upper extremity and girdle weakness of the lower extremity concerning for a myopathic process. 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  Recommendations: -MR brain with without contrast -MR C-spine with without contrast -CK levels, aldolase -Because of the  prevalence of cardiac abnormalities in patients with congenital myotonic dystrophy, I would recommend an echocardiogram as well. -I am not really sure an EEG would be of much use in his case. -Muscle biopsy should be considered if the MRI of the brain and C-spine is unremarkable. -Outpatient EMG nerve conduction studies -Vitamin D, B12, folate, TSH, vitamin E, copper levels -Get more history including family history from someone was better knowledge of his medical past.  We will defer to the day team.  -- Bruce Dikes, MD Neurologist [facility] Pager: [phone]

## 2022-01-22 NOTE — Progress Notes (Signed)
Patient off floor to MRI

## 2022-01-23 LAB — ALDOLASE: Aldolase: 5.8 U/L (ref 3.3–10.3)

## 2022-01-23 LAB — RAPID HIV SCREEN (HIV 1/2 AB+AG)
HIV 1/2 Antibodies: NONREACTIVE
HIV-1 P24 Antigen - HIV24: NONREACTIVE

## 2022-01-23 MED ORDER — PANTOPRAZOLE SODIUM 40 MG PO TBEC: 40.0000 mg | DELAYED_RELEASE_TABLET | Freq: Every day | ORAL | 0 refills | Status: DC

## 2022-01-23 MED ORDER — THIAMINE HCL 100 MG PO TABS: 100.0000 mg | ORAL_TABLET | Freq: Every day | ORAL | 1 refills | Status: DC

## 2022-01-23 MED ORDER — VITAMIN B-12 1000 MCG PO TABS: 1000.0000 ug | ORAL_TABLET | Freq: Every day | ORAL | 6 refills | Status: DC

## 2022-01-23 NOTE — Evaluation (Signed)
Speech Language Pathology Evaluation Patient Details Name: [name] MRN: [id number] DOB: 11-29-1960 Today's Date: 01/23/2022 Time: 1020-1045 SLP Time Calculation (min) (ACUTE ONLY): 25 min  Problem List:  Patient Active Problem List   Diagnosis Date Noted   Proximal weakness of limb 01/21/2022   GERD (gastroesophageal reflux disease) 04/10/2013   Eosinophilic esophagitis 04/10/2013   Esophageal dysphagia 04/10/2013   Past Medical History:  Past Medical History:  Diagnosis Date   Clavicle fracture 07/27/2015   Eosinophilic esophagitis 2008   Previously treated with systemic corticosteroid therapy as the patient could not afford Flovent at that time. Patient was referred to the allergist but did not go to the appointment.   GERD (gastroesophageal reflux disease)    Seasonal allergies    Past Surgical History:  Past Surgical History:  Procedure Laterality Date   ESOPHAGOGASTRODUODENOSCOPY  01/31/2007   [id number] food impaction/ Schatzki's ring, inflamed distal esophageal mucosa   ESOPHAGOGASTRODUODENOSCOPY  02/14/2007   RMR: Somewhat ribbed appearing esophageal mucosa (biopsy-eosinophilic esophagitis), prominent Schatzki ring status post dilation with Maloney dilators.  Pale raised esophageal nodule status post biopsy (eosinophilic esophagitis). Moderate sized hiatal hernia.    ESOPHAGOGASTRODUODENOSCOPY (EGD) WITH ESOPHAGEAL DILATION N/A 04/25/2013   RFF:[id number] reflux esophagitis/eosinophilic esophagitis biopsy proven/Hiatal hernia/Antral erosions. conservative esophageal dilation   HPI:  [name] is a 61 y.o. male who presented with proximal BUE weakness, memory issues and and sluttering speech, symptoms have been going on for a few months. Pt does not have significant past medical history. MRI no abnormal intracranial enhancement. Per MD notes "will await further recommendation regarding muscle biopsy from neurology."   Assessment / Plan / Recommendation Clinical  Impression  Pt's oral-motor exam was unremarkable and he demonstrated motor speech, language and cognitive impairments. His dysfluency occured mainly in the initial position of words with part and whole word repetition increasing in frequency as his rate and excitability increased. Articulation is imprecise and language deficits noted as well marked by decreased syntax and occasionaly morphology. The clock drawing portion of the SLUMS was omitted given right hand weakness. Out of 26 points he scored an 8 noting impairments in memory (1/5 words recalled), mental calculation, divergent naming and paragraph recall. 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 He appears to need significant supervision at this time. ST will follow while in hospital and recommend home health.    SLP Assessment  SLP Recommendation/Assessment: Patient needs continued Speech Lanaguage Pathology Services SLP Visit Diagnosis: Aphasia (R47.01) (motor speech)    Recommendations for follow up therapy are one component of a multi-disciplinary discharge planning process, led by the attending physician.  Recommendations may be updated based on patient status, additional functional criteria and insurance authorization.    Follow Up Recommendations  Home health SLP    Assistance Recommended at Discharge     Functional Status Assessment Patient has had a recent decline in their functional status and demonstrates the ability to make significant improvements in function in a reasonable and predictable amount of time.  Frequency and Duration min 2x/week  2 weeks      SLP Evaluation Cognition  Overall Cognitive Status: Impaired/Different from baseline Arousal/Alertness: Awake/alert Orientation Level: Oriented to person;Oriented to place;Oriented to situation;Oriented to time (year incorrect may be aphasia "2033") Year: Other (Comment) (2033) Day of Week: Correct Attention: Sustained Sustained Attention: Appears  intact Memory: Impaired Memory Impairment: Decreased recall of new information;Retrieval deficit;Storage deficit Awareness: Impaired Awareness Impairment: Anticipatory impairment Problem Solving:  (basic appeared  intact) Behaviors: Impulsive Safety/Judgment:  (questionable)       Comprehension  Auditory Comprehension Overall Auditory Comprehension: Appears within functional limits for tasks assessed Visual Recognition/Discrimination Discrimination: Not tested Reading Comprehension Reading Status: Not tested    Expression Expression Primary Mode of Expression: Verbal Verbal Expression Overall Verbal Expression: Impaired Initiation: Impaired (dysfluent) Level of Generative/Spontaneous Verbalization: Conversation Repetition: No impairment Naming: Impairment Divergent:  (moderate impairments) Verbal Errors:  (decreased morphology) Pragmatics: No impairment Written Expression Dominant Hand: Right Written Expression:  (did not attempt clock drawing due to hand weakness)   Oral / Motor  Oral Motor/Sensory Function Overall Oral Motor/Sensory Function: Within functional limits Motor Speech Overall Motor Speech: Impaired Respiration: Within functional limits Phonation: Normal Resonance: Within functional limits Articulation:  (part and whole word repetition) Intelligibility: Intelligibility reduced Word: 75-100% accurate Phrase: 75-100% accurate Sentence: 50-74% accurate Motor Planning: Impaired Level of Impairment: Word Motor Speech Errors: Aware Effective Techniques: Pause;Pacing;Slow rate            Royce Macadamia 01/23/2022, 12:26 PM  Breck Coons Lonell Face.Ed Nurse, children's [phone] Office [phone]

## 2022-01-23 NOTE — TOC Transition Note (Signed)
Transition of Care [facility]) - CM/SW Discharge Note   Patient Details  Name: [name] MRN: [id number] Date of Birth: [birth year]  Transition of Care [facility]) CM/SW Contact:  Harriet Masson, RN Phone Number: 01/23/2022, 2:26 PM   Clinical Narrative:    Patient stable for discharge.  Orders for Home health PT/OT/SLP. Patient is agreeable to use home health agency that accepts patient's insurance.  Spoke to [place] with [facility] and referral accepted.  Patient's friend [name] will transport home. Patient usually drives himself. CMA made PCP apt for hospital follow up. Patient does have insurance so no medication assistance needed.   Final next level of care: Home w Home Health Services Barriers to Discharge: Barriers Resolved   Patient Goals and CMS Choice Patient states their goals for this hospitalization and ongoing recovery are:: return home CMS Medicare.gov Compare Post Acute Care list provided to:: Patient Choice offered to / list presented to : Patient  Discharge Placement               home        Discharge Plan and Services   Discharge Planning Services: CM Consult, Medication Assistance Post Acute Care Choice: Home Health                    HH Arranged: PT, OT, Speech Therapy HH Agency: [facility] Date HH Agency Contacted: 01/23/22 Time HH Agency Contacted: 1320 Representative spoke with at [facility] Agency: Barbara Cower  Social Determinants of Health (SDOH) Interventions     Readmission Risk Interventions Readmission Risk Prevention Plan 01/23/2022  Medication Screening Complete  Transportation Screening Complete  Some recent data might be hidden

## 2022-01-23 NOTE — Progress Notes (Addendum)
Neurology Progress Note [facility] KEMPNER Bruce# [id number] 01/23/2022   S: no new overnight events; no new complaints.   O: Current vital signs: BP (!) 141/84 (BP Location: Left Arm)    Pulse 71    Temp 98.4 F (36.9 C) (Oral)    Resp 20    Ht 5\' 9"  (1.753 m)    Wt 68 kg    SpO2 99%    BMI 22.14 kg/m  Vital signs in last 24 hours: Temp:  [97.5 F (36.4 C)-98.4 F (36.9 C)] 98.4 F (36.9 C) (02/27 1251) Pulse Rate:  [54-71] 71 (02/27 1251) Resp:  [15-21] 20 (02/27 1251) BP: (93-141)/(63-84) 141/84 (02/27 1251) SpO2:  [96 %-99 %] 99 % (02/27 1251) GENERAL: Awake, alert frail older male HEENT: Normocephalic and atraumatic, moist mm, no LN++, no thyromegaly LUNGS: symmetric excursions bilaterally with no audible wheezes. CV: RR, equal pulses bilaterally. ABDOMEN: Soft, nontender, nondistended with normoactive BS  NEURO:  Mental Status: AA&O to self  Language: speech- paraphasic errors. Stuttering PERR. EOMI, visual fields full, no facial asymmetry, facial sensation intact, hearing intact. No evidence of tongue atrophy or fibrillations, tongue/uvula/soft palate midline elevates symmetrically   Motor: significant proximal weakness in all extremities.Elbow flexion and extension are also weak bilaterally 4 -/5.  Grip strength is 4+/5.  Small muscles of the finger both abduction and adduction is grossly 4+/5.  Findings have no left-right asymmetry. Lower extremity examination with 4-/5 hip flexion and extension.  4+/5 knee flexion extension and 4+/5 ankle dorsiflexion plantarflexion. Tone: Tone is increased BUE Sensation: Intact to light touch bilaterally Coordination: FTN intact bilaterally, no ataxia in BLE.Weakness noted BUE Gait - Deferred   Labs: CK -224, aldolase-5.8 , B12-179 and supplements started, folate-9.2, TSH-1.315    Component Value Date/Time   WBC 9.5 01/21/2022 1402   RBC 5.03 01/21/2022 1402   HGB 15.1 01/21/2022 1402   HCT 47.5 01/21/2022 1402   PLT 254 01/21/2022  1402   MCV 94.4 01/21/2022 1402   MCH 30.0 01/21/2022 1402   MCHC 31.8 01/21/2022 1402   RDW 12.2 01/21/2022 1402   LYMPHSABS 2.1 01/21/2022 1402   MONOABS 0.8 01/21/2022 1402   EOSABS 0.1 01/21/2022 1402   BASOSABS 0.1 01/21/2022 1402       Component Value Date/Time   NA 139 01/21/2022 1402   K 4.1 01/21/2022 1402   CL 106 01/21/2022 1402   CO2 27 01/21/2022 1402   GLUCOSE 101 (H) 01/21/2022 1402   BUN 12 01/21/2022 1402   CREATININE 0.74 01/21/2022 1402   CALCIUM 9.3 01/21/2022 1402   PROT 7.0 01/21/2022 1402   ALBUMIN 4.1 01/21/2022 1402   AST 25 01/21/2022 1402   ALT 22 01/21/2022 1402   ALKPHOS 54 01/21/2022 1402   BILITOT 1.1 01/21/2022 1402   GFRNONAA >60 01/21/2022 1402       Component Value Date/Time   CHOL 157 01/22/2022 0657   TRIG 90 01/22/2022 0657   HDL 43 01/22/2022 0657   CHOLHDL 3.7 01/22/2022 0657   VLDL 18 01/22/2022 0657   LDLCALC 96 01/22/2022 0657    Medications: Scheduled Meds:  cyanocobalamin  1,000 mcg Intramuscular Daily   Followed by   Derrill Memo ON 01/29/2022] cyanocobalamin  1,000 mcg Intramuscular Weekly   dorzolamide-timolol  1 drop Right Eye BID   enoxaparin (LOVENOX) injection  40 mg Subcutaneous Q24H   latanoprost  1 drop Right Eye QHS   pantoprazole  40 mg Oral Daily   [START ON 01/31/2022] thiamine injection  100 mg Intravenous Daily   Continuous Infusions:  thiamine injection 500 mg (01/23/22 0540)   Followed by   Derrill Memo ON 01/25/2022] thiamine injection     PRN Meds:.acetaminophen **OR** acetaminophen (TYLENOL) oral liquid 160 mg/5 mL **OR** acetaminophen, ibuprofen  Imaging I have reviewed images in epic and the results pertinent to this consultation are:  MRI C- Spine w/wo showed No abnormal cord signal or enhancement.Multilevel degenerative changes as detailed above. Canal stenosis is greatest at C3-C4. Foraminal narrowing is greatest at C5-C6.   MRI Brain showed No abnormal intracranial enhancement.No acute or reversible  finding.Mild chronic small vessel ischemia.  Assessment: [name] is a 61 year old man presenting for evaluation of a few months worth of multiple complaints that include proximal muscle weakness worse in the upper extremities then lower extremities and cognitive deficits along with stuttering and slurred speech.  On examination he has some subtle evidence of myotonia evidenced by difficulty releasing his grip and also significant proximal weakness in cape like pattern in the upper extremities and some girdle weakness in the lower extremities.  He also has left eye cataract/lenticular opacity.  Also has been complaining of cognitive complaints-especially with short-term memory that has also been ongoing for a few months.  Has had weight loss which has been unexplained as well.  Impression: Differential diagnoses include: -Lead poisoning? 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 -Pesticide poisoning? 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 -Myopathic process? Significant proximal upper extremity and girdle weakness of the lower extremity concerning  Myotonic dystrophy? 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 - ALS less likely as this disorder usually starts with weakness on one side and then progresses to the other and [name] weakness affected both arms simultaneously.   Recommendations: -Because of the prevalence of cardiac abnormalities in patients with congenital myotonic dystrophy, I would recommend an echocardiogram as well.  -Outpatient EMG nerve conduction studies -Heavy metals serum blood ordered to evaluate for lead poisoning -RBC cholinesterase serum  blood and pseudocholinesterase ordered to evaluate for pesticide exposure - Vit D& E plus copper level pending  - Follow up with Neurology clinic with Dr. Narda Cooper for EMG/NCS and for further workup. She would need to follow up on the labs ordered inpatient. - [place] Myopathy 2 syndrome panel.    Electronically signed by:  Parke Poisson, Neurology NP Can be reached on Epic Secure Messenger 01/23/2022, 1:02 PM  If 7pm- 7am, please page neurology on call as listed in [facility].

## 2022-01-23 NOTE — Discharge Summary (Signed)
Physician Discharge Summary  [name] [id number] DOB: 1961/03/28 DOA: 01/21/2022  PCP: Merryl Hacker, No  Admit date: 01/21/2022 Discharge date: 01/23/2022  Admitted From: Home Disposition:  Home   Recommendations for Outpatient Follow-up:  Follow up with PCP in 1-2 weeks Please monitor B12 level closely, and change to IM supplements if no improvement with oral supplements. Patient will need to follow-up with neurology as an outpatient, ambulatory referral has been made to Dr. Posey Pronto Follow on work-up obtained during hospital stay including heavy metal, infectious etiology and autoimmune work-up.  [place] myopathic panel,  Home Health:YES  Discharge Condition:Stable CODE STATUS:FULL Diet recommendation: Regular   Brief/Interim Summary:     Significant proximal upper extremity and greater weakness ending for myopathic process. -Agement per neurology, MRI brain with without contrast with no acute findings, as well MRI C-spine with and without contrast with no acute findings . -CK within normal limit  -2D echo with a preserved EF  -Wide differentials, multiple labs were sent by neurology and results are still pending at time of discharge, patient will need to follow-up with neurology as an outpatient, I have discussed with in-house neurologist, recommendation to follow-up with Dr. Narda Amber as she is musculoskeletal specialist, ambulatory referral has been sent . 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   B12 deficiency -B12  significantly low at 179, will start on IM supplements.   GERD -Continue with PPI     Discharge Diagnoses:  Principal Problem:   Proximal weakness of limb    Discharge Instructions  Discharge Instructions     Ambulatory referral to Neurology   Complete by: As directed    An appointment is requested in approximately: 4 weeks   Diet - low sodium heart healthy   Complete by: As directed    Discharge instructions   Complete by: As directed    Follow with Primary MD  Get CBC, CMP,    Activity: As tolerated with Full fall precautions use walker/cane & assistance as needed   Disposition Home    Diet: Heart Healthy    On your next visit with your primary care physician please Get Medicines reviewed and adjusted.   Please request your Prim.MD to go over all Hospital Tests and Procedure/Radiological results at the follow up, please get all Hospital records sent to your Prim MD by signing hospital release before you go home.   If you experience worsening of your admission symptoms, develop shortness of breath, life threatening emergency, suicidal or homicidal thoughts you must seek medical attention immediately by calling 911 or calling your MD immediately  if symptoms less severe.  You Must read complete instructions/literature along with all the possible adverse reactions/side effects for all the Medicines you take and that have been prescribed to you. Take any new Medicines after you have completely understood and accpet all the possible adverse reactions/side effects.   Do not drive, operating heavy machinery, perform activities at heights, swimming or participation in water activities or provide baby sitting services if your were admitted for syncope or siezures until you have seen by Primary  MD or a Neurologist and advised to do so again.  Do not drive when taking Pain medications.    Do not take more than prescribed Pain, Sleep and Anxiety Medications  Special  Instructions: If you have smoked or chewed Tobacco  in the last 2 yrs please stop smoking, stop any regular Alcohol  and or any Recreational drug use.  Wear Seat belts while driving.   Please note  You were cared for by a hospitalist during your hospital stay. If you have any questions about your discharge medications or the care you received while you were in the hospital after you are discharged, you can call the unit and asked to speak with the hospitalist on call if the hospitalist that took care of you is not available. Once you are discharged, your primary care physician will handle any further medical issues. Please note that NO REFILLS for any discharge medications will be authorized once you are discharged, as it is imperative that you return to your primary care physician (or establish a relationship with a primary care physician if you do not have one) for your aftercare needs so that they can reassess your need for medications and monitor your lab values.   Increase activity slowly   Complete by: As directed       Allergies as of 01/23/2022   No Known Allergies      Medication List     STOP taking these medications    HYDROcodone-acetaminophen 5-325 MG tablet Commonly known as: NORCO/VICODIN   ibuprofen 600 MG tablet Commonly known as: ADVIL   omeprazole 10 MG capsule Commonly known as: PRILOSEC Replaced by: pantoprazole 40 MG tablet       TAKE these medications    dorzolamide-timolol 22.3-6.8 MG/ML ophthalmic solution Commonly known as: COSOPT Place 1 drop into the right eye 2 (two) times daily.   latanoprost 0.005 % ophthalmic solution Commonly known as: XALATAN Place 1 drop into the right eye at bedtime.   pantoprazole 40 MG tablet Commonly known as: PROTONIX Take 1 tablet (40 mg total) by mouth daily. Start taking on: January 24, 2022 Replaces: omeprazole 10 MG capsule   thiamine 100 MG tablet Take 1 tablet (100 mg total) by mouth daily.    vitamin B-12 1000 MCG tablet Commonly known as: CYANOCOBALAMIN Take 1 tablet (1,000 mcg total) by mouth daily.        Follow-up [facility] Follow up.   Why: Home Health PT/OT/SLP have been arragned. They will call [name] to schedule apt Contact information: [place] [address] [phone]         Narda Amber K, DO Follow up.   Specialty: Neurology Contact information: [place] [id number] [phone]                No Known Allergies  Consultations: Neurology   Procedures/Studies: CT HEAD WO CONTRAST (5MM)  Result Date: 01/21/2022 CLINICAL DATA:  Neuro deficit, acute, stroke suspected EXAM: CT HEAD WITHOUT CONTRAST TECHNIQUE: Contiguous axial images were obtained from the base of the skull through the vertex without intravenous contrast. RADIATION DOSE REDUCTION: This exam was performed according to the departmental dose-optimization program which includes automated exposure control, adjustment of the mA and/or kV according to patient size and/or use of iterative reconstruction technique. COMPARISON:  None. FINDINGS: Brain: There is no acute intracranial hemorrhage, mass effect, or edema. Gray-white differentiation is preserved. There  is no extra-axial fluid collection. Prominence of the ventricles and sulci reflects minor parenchymal volume loss. Patchy low-density in the supratentorial white matter is nonspecific but may reflect mild chronic microvascular ischemic changes. Vascular: No hyperdense vessel or unexpected calcification. Skull: Calvarium is unremarkable. Sinuses/Orbits: No acute finding. Other: None. IMPRESSION: No acute intracranial abnormality. Mild chronic microvascular ischemic changes. Electronically Signed   By: Macy Mis M.D.   On: 01/21/2022 14:35   MR BRAIN WO CONTRAST  Result Date: 01/22/2022 CLINICAL DATA:  Neuro deficit with acute stroke  suspected EXAM: MRI HEAD WITHOUT CONTRAST TECHNIQUE: Multiplanar, multiecho pulse sequences of the brain and surrounding structures were obtained without intravenous contrast. COMPARISON:  Head CT from yesterday FINDINGS: Brain: No acute infarction, hemorrhage, hydrocephalus, extra-axial collection or mass lesion. Mild generalized cerebral volume loss and chronic small vessel ischemia in the hemispheric white matter. Vascular: Normal flow voids. Skull and upper cervical spine: Normal marrow signal. Sinuses/Orbits: Negative. IMPRESSION: No acute or reversible finding. Mild chronic small vessel ischemia. Electronically Signed   By: Jorje Guild M.D.   On: 01/22/2022 09:33   MR BRAIN W CONTRAST  Result Date: 01/22/2022 CLINICAL DATA:  Abnormal speech, proximal muscle weakness EXAM: MRI HEAD WITH CONTRAST TECHNIQUE: Multiplanar, multiecho pulse sequences of the brain and surrounding structures were obtained with intravenous contrast. CONTRAST:  41mL GADAVIST GADOBUTROL 1 MMOL/ML IV SOLN COMPARISON:  Noncontrast study earlier same day FINDINGS: Postcontrast imaging demonstrates no abnormal intracranial enhancement. No other significant abnormality identified. IMPRESSION: No abnormal intracranial enhancement. Electronically Signed   By: Macy Mis M.D.   On: 01/22/2022 17:46   MR CERVICAL SPINE W WO CONTRAST  Result Date: 01/22/2022 CLINICAL DATA:  Proximal muscle weakness EXAM: MRI CERVICAL SPINE WITHOUT AND WITH CONTRAST TECHNIQUE: Multiplanar and multiecho pulse sequences of the cervical spine, to include the craniocervical junction and cervicothoracic junction, were obtained without and with intravenous contrast. CONTRAST:  58mL GADAVIST GADOBUTROL 1 MMOL/ML IV SOLN COMPARISON:  None. FINDINGS: Alignment: No significant listhesis. Vertebrae: Vertebral body heights are maintained. No substantial marrow edema. No suspicious osseous lesion. Cord: No abnormal signal.  No abnormal intrathecal enhancement.  Posterior Fossa, vertebral arteries, paraspinal tissues: Unremarkable. Intracranial structures evaluated separately. Disc levels: C2-C3: Punctate central annular fissure. Facet hypertrophy. No canal or foraminal stenosis. C3-C4: Disc bulge with endplate osteophytes. Uncovertebral and facet hypertrophy. Thickening of the ligamentum flavum. Moderate canal stenosis. Mild to moderate foraminal stenosis, left greater than right. C4-C5: Minimal disc bulge. Facet hypertrophy. No canal or foraminal stenosis. C5-C6: Disc bulge with endplate osteophytes. Uncovertebral and facet hypertrophy. Mild canal stenosis. Moderate foraminal stenosis. C6-C7: Disc bulge with endplate osteophytes. Small superimposed right paracentral protrusion. Uncovertebral hypertrophy. Minor canal stenosis. Minor foraminal stenosis. C7-T1:  Facet hypertrophy.  No canal or foraminal stenosis. IMPRESSION: No abnormal cord signal or enhancement. Multilevel degenerative changes as detailed above. Canal stenosis is greatest at C3-C4. Foraminal narrowing is greatest at C5-C6. Electronically Signed   By: Macy Mis M.D.   On: 01/22/2022 17:44   ECHOCARDIOGRAM COMPLETE  Result Date: 01/22/2022    ECHOCARDIOGRAM REPORT   Patient Name:   JOSEMARIA TOLMAN Date of Exam: 01/22/2022 Medical Rec #:  [id number]     Height:       69.0 in Accession #:    [id number]    Weight:       149.9 lb Date of Birth:  12-05-1960     BSA:          1.828 m Patient Age:  60 years      BP:           117/80 mmHg Patient Gender: M             HR:           61 bpm. Exam Location:  Inpatient Procedure: 2D Echo Indications:    TIA  History:        Patient has no prior history of Echocardiogram examinations.  Sonographer:    Johny Chess RDCS Referring Phys: [place]  1. Left ventricular ejection fraction, by estimation, is 60 to 65%. The left ventricle has normal function. The left ventricle has no regional wall motion abnormalities. Left ventricular  diastolic parameters were normal.  2. Right ventricular systolic function is normal. The right ventricular size is normal. Tricuspid regurgitation signal is inadequate for assessing PA pressure.  3. The mitral valve is normal in structure. No evidence of mitral valve regurgitation. No evidence of mitral stenosis.  4. The aortic valve is tricuspid. Aortic valve regurgitation is trivial. Aortic valve sclerosis/calcification is present, without any evidence of aortic stenosis.  5. The inferior vena cava is normal in size with greater than 50% respiratory variability, suggesting right atrial pressure of 3 mmHg. FINDINGS  Left Ventricle: Left ventricular ejection fraction, by estimation, is 60 to 65%. The left ventricle has normal function. The left ventricle has no regional wall motion abnormalities. The left ventricular internal cavity size was normal in size. There is  no left ventricular hypertrophy. Left ventricular diastolic parameters were normal. Normal left ventricular filling pressure. Right Ventricle: The right ventricular size is normal. No increase in right ventricular wall thickness. Right ventricular systolic function is normal. Tricuspid regurgitation signal is inadequate for assessing PA pressure. Left Atrium: Left atrial size was normal in size. Right Atrium: Right atrial size was normal in size. Pericardium: There is no evidence of pericardial effusion. Mitral Valve: The mitral valve is normal in structure. No evidence of mitral valve regurgitation. No evidence of mitral valve stenosis. Tricuspid Valve: The tricuspid valve is normal in structure. Tricuspid valve regurgitation is not demonstrated. No evidence of tricuspid stenosis. Aortic Valve: The aortic valve is tricuspid. Aortic valve regurgitation is trivial. Aortic valve sclerosis/calcification is present, without any evidence of aortic stenosis. Pulmonic Valve: The pulmonic valve was normal in structure. Pulmonic valve regurgitation is not  visualized. No evidence of pulmonic stenosis. Aorta: The aortic root is normal in size and structure. Venous: The inferior vena cava is normal in size with greater than 50% respiratory variability, suggesting right atrial pressure of 3 mmHg. IAS/Shunts: No atrial level shunt detected by color flow Doppler.  LEFT VENTRICLE PLAX 2D LVIDd:         3.80 cm   Diastology LVIDs:         3.20 cm   LV e' medial:    8.55 cm/s LV PW:         1.00 cm   LV E/e' medial:  5.9 LV IVS:        1.00 cm   LV e' lateral:   13.70 cm/s LVOT diam:     2.20 cm   LV E/e' lateral: 3.7 LV SV:         71 LV SV Index:   39 LVOT Area:     3.80 cm  RIGHT VENTRICLE             IVC RV S prime:     19.60 cm/s  IVC  diam: 1.50 cm TAPSE (M-mode): 2.2 cm LEFT ATRIUM             Index        RIGHT ATRIUM           Index LA diam:        2.80 cm 1.53 cm/m   RA Area:     11.10 cm LA Vol (A2C):   48.1 ml 26.32 ml/m  RA Volume:   22.70 ml  12.42 ml/m LA Vol (A4C):   26.3 ml 14.39 ml/m LA Biplane Vol: 39.1 ml 21.39 ml/m  AORTIC VALVE LVOT Vmax:   98.00 cm/s LVOT Vmean:  60.300 cm/s LVOT VTI:    0.188 m  AORTA Ao Root diam: 3.40 cm Ao Asc diam:  3.20 cm MITRAL VALVE MV Area (PHT): 2.22 cm    SHUNTS MV Decel Time: 342 msec    Systemic VTI:  0.19 m MV E velocity: 50.30 cm/s  Systemic Diam: 2.20 cm MV A velocity: 45.10 cm/s MV E/A ratio:  1.12 Fransico Him MD Electronically signed by Fransico Him MD Signature Date/Time: 01/22/2022/1:40:41 PM    Final       Subjective:  Patient denies any complaints overnight, no new focal deficits, tingling or numbness Discharge Exam: Vitals:   01/23/22 0816 01/23/22 1251  BP: 127/83 (!) 141/84  Pulse: (!) 58 71  Resp: 19 20  Temp: 97.6 F (36.4 C) 98.4 F (36.9 C)  SpO2: 97% 99%   Vitals:   01/22/22 2319 01/23/22 0305 01/23/22 0816 01/23/22 1251  BP: 122/71 93/63 127/83 (!) 141/84  Pulse:  (!) 54 (!) 58 71  Resp: (!) 21 20 19 20   Temp: 98 F (36.7 C) 97.7 F (36.5 C) 97.6 F (36.4 C) 98.4 F (36.9  C)  TempSrc: Oral Oral Oral Oral  SpO2: 96% 96% 97% 99%  Weight:      Height:        General: Pt is alert, awake, not in acute distress, patient with significant stuttering, Cardiovascular: RRR, S1/S2 +, no rubs, no gallops Respiratory: CTA bilaterally, no wheezing, no rhonchi Abdominal: Soft, NT, ND, bowel sounds + Extremities: no edema, no cyanosis, he is with significant proximal weakness in all extremities.    The results of significant diagnostics from this hospitalization (including imaging, microbiology, ancillary and laboratory) are listed below for reference.     Microbiology: Recent Results (from the past 240 hour(s))  Resp Panel by RT-PCR (Flu A&B, Covid) Nasopharyngeal Swab     Status: None   Collection Time: 01/21/22  1:54 PM   Specimen: Nasopharyngeal Swab; Nasopharyngeal(NP) swabs in vial transport medium  Result Value Ref Range Status   SARS Coronavirus 2 by RT PCR NEGATIVE NEGATIVE Final    Comment: (NOTE) SARS-CoV-2 target nucleic acids are NOT DETECTED.  The SARS-CoV-2 RNA is generally detectable in upper respiratory specimens during the acute phase of infection. The lowest concentration of SARS-CoV-2 viral copies this assay can detect is 138 copies/mL. A negative result does not preclude SARS-Cov-2 infection and should not be used as the sole basis for treatment or other patient management decisions. A negative result may occur with  improper specimen collection/handling, submission of specimen other than nasopharyngeal swab, presence of viral mutation(s) within the areas targeted by this assay, and inadequate number of viral copies(<138 copies/mL). A negative result must be combined with clinical observations, patient history, and epidemiological information. The expected result is Negative.  Fact Sheet for Patients:  EntrepreneurPulse.com.au  Fact Sheet for  Healthcare Providers:  IncredibleEmployment.be  This  test is no t yet approved or cleared by the [place] and  has been authorized for detection and/or diagnosis of SARS-CoV-2 by FDA under an Emergency Use Authorization (EUA). This EUA will remain  in effect (meaning this test can be used) for the duration of the COVID-19 declaration under Section 564(b)(1) of the Act, 21 U.S.C.section 360bbb-3(b)(1), unless the authorization is terminated  or revoked sooner.       Influenza A by PCR NEGATIVE NEGATIVE Final   Influenza B by PCR NEGATIVE NEGATIVE Final    Comment: (NOTE) The Xpert Xpress SARS-CoV-2/FLU/RSV plus assay is intended as an aid in the diagnosis of influenza from Nasopharyngeal swab specimens and should not be used as a sole basis for treatment. Nasal washings and aspirates are unacceptable for Xpert Xpress SARS-CoV-2/FLU/RSV testing.  Fact Sheet for Patients: EntrepreneurPulse.com.au  Fact Sheet for Healthcare Providers: IncredibleEmployment.be  This test is not yet approved or cleared by the [place] FDA and has been authorized for detection and/or diagnosis of SARS-CoV-2 by FDA under an Emergency Use Authorization (EUA). This EUA will remain in effect (meaning this test can be used) for the duration of the COVID-19 declaration under Section 564(b)(1) of the Act, 21 U.S.C. section 360bbb-3(b)(1), unless the authorization is terminated or revoked.  Performed at [facility], [address] [place], [place] [address]      Labs: BNP (last 3 results) No results for input(s): BNP in the last 8760 hours. Basic Metabolic Panel: Recent Labs  Lab 01/21/22 1402  NA 139  K 4.1  CL 106  CO2 27  GLUCOSE 101*  BUN 12  CREATININE 0.74  CALCIUM 9.3   Liver Function Tests: Recent Labs  Lab 01/21/22 1402  AST 25  ALT 22  ALKPHOS 54  BILITOT 1.1  PROT 7.0  ALBUMIN 4.1   No results for input(s): LIPASE, AMYLASE in the last 168 hours. No results for input(s):  AMMONIA in the last 168 hours. CBC: Recent Labs  Lab 01/21/22 1402  WBC 9.5  NEUTROABS 6.4  HGB 15.1  HCT 47.5  MCV 94.4  PLT 254   Cardiac Enzymes: Recent Labs  Lab 01/22/22 0657  CKTOTAL 224   BNP: Invalid input(s): POCBNP CBG: No results for input(s): GLUCAP in the last 168 hours. D-Dimer No results for input(s): DDIMER in the last 72 hours. Hgb A1c Recent Labs    01/22/22 0651  HGBA1C 4.9   Lipid Profile Recent Labs    01/22/22 0657  CHOL 157  HDL 43  LDLCALC 96  TRIG 90  CHOLHDL 3.7   Thyroid function studies Recent Labs    01/22/22 0651  TSH 1.315   Anemia work up Recent Labs    01/22/22 0651  VITAMINB12 179*  FOLATE 9.2   Urinalysis    Component Value Date/Time   COLORURINE YELLOW 01/21/2022 1354   APPEARANCEUR HAZY (A) 01/21/2022 1354   LABSPEC 1.021 01/21/2022 1354   PHURINE 7.0 01/21/2022 1354   GLUCOSEU NEGATIVE 01/21/2022 1354   HGBUR NEGATIVE 01/21/2022 1354   [place] 01/21/2022 1354   KETONESUR NEGATIVE 01/21/2022 1354   PROTEINUR NEGATIVE 01/21/2022 1354   NITRITE NEGATIVE 01/21/2022 1354   LEUKOCYTESUR NEGATIVE 01/21/2022 1354   Sepsis Labs Invalid input(s): PROCALCITONIN,  WBC,  LACTICIDVEN Microbiology Recent Results (from the past 240 hour(s))  Resp Panel by RT-PCR (Flu A&B, Covid) Nasopharyngeal Swab     Status: None   Collection Time: 01/21/22  1:54  PM   Specimen: Nasopharyngeal Swab; Nasopharyngeal(NP) swabs in vial transport medium  Result Value Ref Range Status   SARS Coronavirus 2 by RT PCR NEGATIVE NEGATIVE Final    Comment: (NOTE) SARS-CoV-2 target nucleic acids are NOT DETECTED.  The SARS-CoV-2 RNA is generally detectable in upper respiratory specimens during the acute phase of infection. The lowest concentration of SARS-CoV-2 viral copies this assay can detect is 138 copies/mL. A negative result does not preclude SARS-Cov-2 infection and should not be used as the sole basis for treatment  or other patient management decisions. A negative result may occur with  improper specimen collection/handling, submission of specimen other than nasopharyngeal swab, presence of viral mutation(s) within the areas targeted by this assay, and inadequate number of viral copies(<138 copies/mL). A negative result must be combined with clinical observations, patient history, and epidemiological information. The expected result is Negative.  Fact Sheet for Patients:  EntrepreneurPulse.com.au  Fact Sheet for Healthcare Providers:  IncredibleEmployment.be  This test is no t yet approved or cleared by the [place] FDA and  has been authorized for detection and/or diagnosis of SARS-CoV-2 by FDA under an Emergency Use Authorization (EUA). This EUA will remain  in effect (meaning this test can be used) for the duration of the COVID-19 declaration under Section 564(b)(1) of the Act, 21 U.S.C.section 360bbb-3(b)(1), unless the authorization is terminated  or revoked sooner.       Influenza A by PCR NEGATIVE NEGATIVE Final   Influenza B by PCR NEGATIVE NEGATIVE Final    Comment: (NOTE) The Xpert Xpress SARS-CoV-2/FLU/RSV plus assay is intended as an aid in the diagnosis of influenza from Nasopharyngeal swab specimens and should not be used as a sole basis for treatment. Nasal washings and aspirates are unacceptable for Xpert Xpress SARS-CoV-2/FLU/RSV testing.  Fact Sheet for Patients: EntrepreneurPulse.com.au  Fact Sheet for Healthcare Providers: IncredibleEmployment.be  This test is not yet approved or cleared by the [place] FDA and has been authorized for detection and/or diagnosis of SARS-CoV-2 by FDA under an Emergency Use Authorization (EUA). This EUA will remain in effect (meaning this test can be used) for the duration of the COVID-19 declaration under Section 564(b)(1) of the Act, 21 U.S.C. section  360bbb-3(b)(1), unless the authorization is terminated or revoked.  Performed at [facility], [address], [place], [place] [address]      Time coordinating discharge: Over 30 minutes  SIGNED:   Phillips Climes, MD  [facility] 01/23/2022, 1:38 PM Pager   If 7PM-7AM, please contact night-coverage [email or web address] Password [email or web address]

## 2022-01-24 ENCOUNTER — Other Ambulatory Visit: Payer: Self-pay

## 2022-01-24 ENCOUNTER — Encounter: Payer: Self-pay | Admitting: Neurology

## 2022-01-24 DIAGNOSIS — R202 Paresthesia of skin: Secondary | ICD-10-CM

## 2022-01-24 LAB — MISC LABCORP TEST (SEND OUT)
LabCorp test name: 7211
Labcorp test code: 9985

## 2022-01-24 LAB — EPSTEIN-BARR VIRUS VCA, IGG: EBV VCA IgG: >600 U/mL — ABNORMAL HIGH (ref 0.0–17.9)

## 2022-01-24 LAB — EPSTEIN-BARR VIRUS VCA, IGM: EBV VCA IgM: <36 U/mL (ref 0.0–35.9)

## 2022-01-24 LAB — ANTIEXTRACTABLE NUCLEAR AG
ENA SM Ab Ser-aCnc: <0.2 AI (ref 0.0–0.9)
Ribonucleic Protein: <0.2 AI (ref 0.0–0.9)

## 2022-01-24 LAB — COPPER, SERUM: Copper: 87 ug/dL (ref 69–132)

## 2022-01-24 LAB — CMV IGM: CMV IgM: <30 AU/mL (ref 0.0–29.9)

## 2022-01-24 LAB — ANTI-DNA ANTIBODY, DOUBLE-STRANDED: ds DNA Ab: 1 IU/mL (ref 0–9)

## 2022-01-25 LAB — VITAMIN B1: Vitamin B1 (Thiamine): 130.3 nmol/L (ref 66.5–200.0)

## 2022-01-25 LAB — HEAVY METALS, BLOOD
Arsenic: <1 ug/L (ref 0–9)
Lead: 1.8 ug/dL (ref 0.0–3.4)
Mercury: <1 ug/L (ref 0.0–14.9)

## 2022-01-27 LAB — ANTINUCLEAR ANTIBODIES, IFA: ANA Ab, IFA: NEGATIVE

## 2022-01-30 LAB — VITAMIN E
Vitamin E (Alpha Tocopherol): 8.2 mg/L — ABNORMAL LOW (ref 9.0–29.0)
Vitamin E(Gamma Tocopherol): 0.6 mg/L (ref 0.5–4.9)

## 2022-02-13 LAB — MISC LABCORP TEST (SEND OUT): Labcorp test code: 9985

## 2022-02-14 ENCOUNTER — Encounter: Payer: Self-pay | Admitting: *Deleted

## 2022-02-14 ENCOUNTER — Encounter: Payer: 59 | Admitting: Neurology

## 2022-02-23 ENCOUNTER — Encounter: Payer: Self-pay | Admitting: Neurology

## 2022-02-23 ENCOUNTER — Ambulatory Visit: Payer: 59 | Admitting: Neurology

## 2022-02-23 ENCOUNTER — Encounter: Payer: 59 | Admitting: Neurology

## 2022-02-23 VITALS — BP 115/80 | HR 63 | Resp 18 | Ht 73.0 in | Wt 129.0 lb

## 2022-02-23 DIAGNOSIS — R29898 Other symptoms and signs involving the musculoskeletal system: Secondary | ICD-10-CM

## 2022-02-23 DIAGNOSIS — R4189 Other symptoms and signs involving cognitive functions and awareness: Secondary | ICD-10-CM

## 2022-02-23 DIAGNOSIS — R471 Dysarthria and anarthria: Secondary | ICD-10-CM | POA: Diagnosis not present

## 2022-02-23 NOTE — Patient Instructions (Signed)
We will make referral to [facility] for out-patient physical therapy, occupational therapy, and speech therapy ? ?We will also place a referral to [facility] for their opinion ?

## 2022-02-23 NOTE — Progress Notes (Signed)
?[facility] ?Neurology Division ?Clinic Note - Initial Visit ? ? ?Date: 02/23/2022 ? ?[name] ?MRN: [id number] ?DOB: Oct 20, 1961 ? ? ?Dear Dr. Randol Kern: ? ?Thank you for your kind referral of [name] for consultation of arm weakness and memory impairment. Although his history is well known to you, please allow [place] to reiterate it for the purpose of our medical record. The patient was accompanied to the clinic by friend [name]) who also provides collateral information.  ?  ? ?History of Present Illness: ?[name] is a 61 y.o. right-handed male with no prior medical care presents for evaluation of bilateral arm weakness and speech changes.   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  For the past 2-3 years, patient has developed bilateral arm weakness, specifically being unable to raise his arms at the shoulder and he was walking more hunched over.  He has good strength in the hands and forearms.  Weakness has been progressive.  He also has weakness with trying to keep his head upright.  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  Over the past 6 months, his weakness progressed and he was unable to raise his arms to drive.  He has been urged by [name] to go to seek medical care over the years, but he has been resistant.  ? ?Also over the past 2-3 years, he has been having speech changes, stuttering, and difficulty with speech content/word-finding.  His friend says that since doing speech therapy, his speech has improved and he is able to get words out easier and stuttering is less.   ? ?He was hospitalized at [facility] from 2/25 - 2/27 with confusion and weakness.  He underwent MRI brain and cervical spine which was notable for moderate canal stenosis at C3-4, no significant abnormalities of the brain. Labs were notable for vitamin B12 and E deficiency. 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   Remaining labs including CBC, CK, aldolase, TSH, copper, folate, vitamin B1, HbA1c, HIV, ANA, sdDNA, ad heavy metals were normal. ? He is getting home OT, PT, and ST. ? ?He endorses significant weight loss over the last 6-8 months.  He denies difficulty with swallowing, but [name] says that his diet is very poor and he does not eat any fruits/vegetables.  He usually eats corn dog and coke every day.  Rarely drinks water.  ? 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   ? ? ?Out-side paper records, electronic medical record, and images have been reviewed where available and summarized as:  ?MRI brain 01/22/2022: ?No acute or reversible finding. ?Mild chronic small vessel ischemia. ? ?MRI cervical spine 01/22/2022; ?No abnormal cord signal or enhancement. ?Multilevel degenerative changes as detailed above. Canal stenosis is greatest at C3-C4. Foraminal narrowing is greatest at C5-C6. ? ?Lab Results  ?Component Value Date  ? HGBA1C 4.9 01/22/2022  ? ?Lab Results  ?Component Value Date  ? VITAMINB12 179 (L) 01/22/2022  ? ?Lab Results  ?Component Value Date  ? TSH 1.315 01/22/2022  ? ?No results found for: ESRSEDRATE, POCTSEDRATE ? ?Past Medical History:  ?Diagnosis Date  ? Clavicle fracture 07/27/2015  ? Eosinophilic esophagitis 2008  ? Previously treated with systemic corticosteroid therapy as the patient could not afford Flovent at that time.  Patient was referred to the allergist but did not go to the appointment.  ? GERD (gastroesophageal reflux disease)   ? Seasonal allergies   ? ? ?Past Surgical History:  ?Procedure Laterality Date  ? ESOPHAGOGASTRODUODENOSCOPY  01/31/2007  ? [id number]:[id number]:Esophageal food impaction/ Schatzki's ring, inflamed distal esophageal mucosa  ? ESOPHAGOGASTRODUODENOSCOPY  02/14/2007  ? RMR: Somewhat ribbed appearing esophageal mucosa (biopsy-eosinophilic esophagitis), prominent Schatzki ring status  post dilation with Maloney dilators.  Pale raised esophageal nodule status post biopsy (eosinophilic esophagitis). Moderate sized hiatal hernia.   ? ESOPHAGOGASTRODUODENOSCOPY (EGD) WITH ESOPHAGEAL DILATION N/A 04/25/2013  ? JXB:[id number]:Erosive reflux esophagitis/eosinophilic esophagitis biopsy proven/Hiatal hernia/Antral erosions. conservative esophageal dilation  ? ? ? ?Medications:  ?Outpatient Encounter Medications as of 02/23/2022  ?Medication Sig  ? dorzolamide-timolol (COSOPT) 22.3-6.8 MG/ML ophthalmic solution Place 1 drop into the right eye 2 (two) times daily.  ? latanoprost (XALATAN) 0.005 % ophthalmic solution Place 1 drop into the right eye at bedtime.  ? methocarbamol (ROBAXIN) 500 MG tablet Take 500 mg by mouth at bedtime.  ? methylPREDNISolone (MEDROL DOSEPAK) 4 MG TBPK tablet Take by mouth as directed.  ? pantoprazole (PROTONIX) 40 MG tablet Take 1 tablet (40 mg total) by mouth daily.  ? thiamine 100 MG tablet Take 1 tablet (100 mg total) by mouth daily.  ? vitamin B-12 (CYANOCOBALAMIN) 1000 MCG tablet Take 1 tablet (1,000 mcg total) by mouth daily.  ? ?No facility-administered encounter medications on file as of 02/23/2022.  ? ? ?Allergies: No Known Allergies ? ?Family History: ?Family History  ?Problem Relation Age of Onset  ? Cancer Mother   ? Alcohol abuse Brother   ? Colon cancer Neg Hx   ? Liver disease Neg Hx   ? ? ?Social History: ?Social History  ? ?Tobacco Use  ? Smoking status: Former  ?  Packs/day: 0.20  ?  Years: 25.00  ?  Pack years: 5.00  ?  Types: Cigarettes  ?  Quit date: 08/05/1979  ?  Years since quitting: 42.5  ? Smokeless tobacco: Former  ?Substance Use Topics  ? Alcohol use: No  ? Drug use: No  ? ?Social History  ? ?Social History Narrative  ? Right handed  ? No caffeine  ? ? ?Vital Signs:  ?BP 115/80   Pulse 63   Resp 18   Ht 6\' 1"  (1.854 m)   Wt 129 lb (58.5 kg)   SpO2 99%   BMI 17.02 kg/m?  ?  ?General Medical Exam:   ?General:  Very thin appearing, marked head drop.   ?Eyes/ENT:  see cranial nerve examination.   ?Neck:   No carotid bruits. ?Respiratory:  Clear to auscultation, good air entry bilaterally.   ?Cardiac:  Regular rate and rhythm, no murmur.   ?Extremities:  Discoloration of the great toe nails.   ?Skin:  No rashes or lesions. ? ?Neurological Exam: ?MENTAL STATUS:  Thought content is poor, he cannot answer questions clearly and tends to talk tangentially, significant stuttering, and mild dysarthria.  He makes paraphasic errors and had difficulty with naming and repeating.  Poor recall. Able to follow one-step commands, unable to follow complex commands.  Oriented to month, day of the week, does not know the date or year.  ? ?CRANIAL NERVES: ?II:  No visual field defects in the right eye, he is blind in the left eye.     ?III-IV-VI: Pupils equal round and reactive to light in the right eye, left lens opacity.  Normal conjugate, extra-ocular eye movements in  all directions of gaze.  No nystagmus.  No ptosis.   ?V:  Normal facial sensation.    ?VII:  Normal facial symmetry and movements.  There is mild facial weakness with buccinator testing. ?VIII:  Normal hearing and vestibular function.   ?IX-X:  Normal palatal movement.   ?XI:  Normal shoulder shrug and head rotation.   ?XII:  Tremulous tongue with reduced side-to-side movements and weakness. ? ?MOTOR:  Profound muscle atrophy involving the cervical and thoracic muscles, shoulder girdle, and deltoid muscles.  I did not observe fasciculations.  No pronator drift.  ? ?Upper Extremity:  Right  Left  ?Deltoid  1/5   1/5   ?Biceps  5-/5   5-/5   ?Triceps  5-/5   5-/5   ?Infraspinatus 2/5  2/5  ?Medial pectoralis 2/5  2/5  ?Wrist extensors  5/5   5/5   ?Wrist flexors  5/5   5/5   ?Finger extensors  5/5   5/5   ?Finger flexors  5/5   5/5   ?Dorsal interossei  5/5   5/5   ?Abductor pollicis  5/5   5/5   ?Tone (Ashworth scale)  0  0  ? ?Lower Extremity:  Right  Left  ?Hip flexors  4/5   4/5   ?Hip extensors  5/5   5/5   ?Adductor 5/5  5/5   ?Abductor 5/5  5/5  ?Knee flexors  5/5   5/5   ?Knee extensors  5/5   5/5   ?Dorsiflexors  5/5   5/5   ?Plantarflexors  5/5   5/5   ?Toe extensors  5/5   5/5   ?Toe flexors  5/5   5/5   ?Tone (Ashworth scale)  0  0

## 2022-03-22 ENCOUNTER — Ambulatory Visit (HOSPITAL_COMMUNITY): Payer: 59 | Attending: Neurology | Admitting: Speech Pathology

## 2022-03-22 ENCOUNTER — Encounter (HOSPITAL_COMMUNITY): Payer: Self-pay | Admitting: Speech Pathology

## 2022-03-22 DIAGNOSIS — R41841 Cognitive communication deficit: Secondary | ICD-10-CM | POA: Diagnosis not present

## 2022-03-22 DIAGNOSIS — R4789 Other speech disturbances: Secondary | ICD-10-CM | POA: Diagnosis present

## 2022-03-22 NOTE — Therapy (Signed)
?OUTPATIENT SPEECH LANGUAGE PATHOLOGY EVALUATION ? ? ?Patient Name: [name] ?MRN: [id number] ?DOB:1961/01/30, 61 y.o., male ?Today's Date: 03/22/2022 ? ?PCP: Neale Burly, MD ?REFERRING PROVIDER: Alda Berthold, DO ? ? End of Session - 03/22/22 1202   ? ? Visit Number 1   ? Number of Visits 6   ? Date for SLP Re-Evaluation 04/27/22   ? Authorization Type [employer]   oop max $3,000.00/ $1,115.55 met  30% co-ins  30 comb pt/ot/st/ 0 used  $70.00 co-pay  prior auth req after the 29th visit  s/w victoria ref#[id number]  ? SLP Start Time 0900   ? SLP Stop Time  0945   ? SLP Time Calculation (min) 45 min   ? Activity Tolerance Patient tolerated treatment well   ? ?  ?  ? ?  ? ? ?Past Medical History:  ?Diagnosis Date  ? Clavicle fracture 07/27/2015  ? Eosinophilic esophagitis 5732  ? Previously treated with systemic corticosteroid therapy as the patient could not afford Flovent at that time. Patient was referred to the allergist but did not go to the appointment.  ? GERD (gastroesophageal reflux disease)   ? Seasonal allergies   ? ?Past Surgical History:  ?Procedure Laterality Date  ? ESOPHAGOGASTRODUODENOSCOPY  01/31/2007  ? KGU:[id number] food impaction/ Schatzki's ring, inflamed distal esophageal mucosa  ? ESOPHAGOGASTRODUODENOSCOPY  02/14/2007  ? RMR: Somewhat ribbed appearing esophageal mucosa (biopsy-eosinophilic esophagitis), prominent Schatzki ring status post dilation with Maloney dilators.  Pale raised esophageal nodule status post biopsy (eosinophilic esophagitis). Moderate sized hiatal hernia.   ? ESOPHAGOGASTRODUODENOSCOPY (EGD) WITH ESOPHAGEAL DILATION N/A 04/25/2013  ? EGB:[id number] reflux esophagitis/eosinophilic esophagitis biopsy proven/Hiatal hernia/Antral erosions. conservative esophageal dilation  ? ?Patient Active Problem List  ? Diagnosis Date Noted  ? Proximal weakness of limb 01/21/2022  ? GERD (gastroesophageal reflux disease) 04/10/2013  ? Eosinophilic esophagitis 07/37/1062  ?  Esophageal dysphagia 04/10/2013  ? ? ?ONSET DATE: 01/21/2022  ? ?REFERRING DIAG: dysarthria and cognitive impairment ? ?THERAPY DIAG:  ?Cognitive communication deficit ? ?Dysfluency ? ?SUBJECTIVE:  ? ?SUBJECTIVE STATEMENT: ?"My speech has been bad for a couple of years, but it has gotten worse." ?Pt accompanied by: self ? ?PERTINENT HISTORY:  Gilmar Bua is a 61 yo male who was referred by Dr. Narda Amber for a cognitive linguistic evaluation due to h/o 2-3 year speech changes with stuttering, word finding difficulty, and memory deficits. For the past 2-3 years, patient has developed bilateral arm weakness, specifically being unable to raise his arms at the shoulder and he was walking more hunched over.  He has good strength in the hands and forearms.  Weakness has been progressive.  He also has weakness with trying to keep his head upright.  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  Over the past 6 months, his weakness progressed and he was unable to raise his arms to drive. He was hospitalized at [facility] from 2/25 - 2/27 with confusion and weakness.  He underwent MRI brain and cervical spine which was notable for moderate canal stenosis at C3-4, no significant abnormalities of the brain. Labs were notable for vitamin B12 and E deficiency. 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   Remaining labs including CBC, CK, aldolase, TSH, copper, folate, vitamin B1, HbA1c, HIV, ANA, sdDNA, ad heavy metals were normal. He works on a farm as a Media planner.  He has been at the same place  for 42 years. 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 He is also being referred to [facility] neuromuscular clinic for evaluation and management, as Dr. Posey Pronto will be out on medical leave. ? ?PAIN:  ?Are you having pain? No ? ? ?FALLS: Has patient fallen in last 6 months?  No ? ?LIVING  ENVIRONMENT: ?Lives with: lives with their family and lives alone ?Lives in: Mobile home ? ?PLOF:  ?Level of assistance: Independent with ADLs ?Employment: Full-time employment ? ? ?PATIENT GOALS "Get my arms moving better and my memory." ? ?OBJECTIVE:  ? ?DIAGNOSTIC FINDINGS: SLUMS ([place] Mental Status Examination) ? ?COGNITION: ?Overall cognitive status: Impaired: Attention: Impaired: Sustained; Memory impaired:  ? ?COGNITIVE COMMUNICATION ?Following directions: Follows multi-step commands inconsistently  ?Auditory comprehension: WFL ?Verbal expression: Impaired:   ?Functional communication: Impaired:   ? ?ORAL MOTOR EXAMINATION ?Facial : Symmetry impaired: WFL ?Lingual: WFL ?Velum: WFL ?Mandible: WFL ?Cough: WFL ?Voice: WFL ? ?STANDARDIZED ASSESSMENTS: ?SLUMS: 12/30  [facility] SLUMS Examination ?Orientation  3/3  ?Numeric Problem Solving  1/3  ?Memory  0/5  ?Attention 0/2  ?Thought Organization 1/3  ?Clock Drawing 2/4  ?Visuospatial Skills             ?  2/2  ?Short Story Recall  2/8  ?Total  12/30   ?  ?Scoring  High School Education  Less than High School Education   ?Normal  27-30 25-30  ?Mild Neurocognitive Disorder 21-26 20-24  ?Dementia  1-20 1-19  ? ? ? ? ?PATIENT EDUCATION: ?Education details: Plan for SLP therapy to address dysfluency and cognitive deficits ?Person educated: Patient ?Education method: Explanation ?Education comprehension: verbalized understanding ? ? ? ? ?GOALS: ?Goals reviewed with patient? Yes ? ?SHORT TERM GOALS: Target date: 05/11/2022 ? ?Pt will implement fluency enhancing and speech intelligibility strategies when generating sentences involving multisyllabic words with 90% acc and min assist. ?Baseline: mod assist ?Goal status: INITIAL ? ?2.  Pt will implement word-finding strategies with 90% accuracy when unable to verbalize desired word in conversation/functional tasks with mi/mod assist. ?Baseline: mod assist ?Goal status: INITIAL ? ?3.  Pt will implement memory  strategies in functional therapy activities with 90% acc with mi/mod cues.  ?Baseline: mod assist ?Goal status: INITIAL ? ? ? ?LONG TERM GOALS: Target date: Same as short term goals ? ? ?ASSESSMENT: ? ?CLINICAL IMPRESSION: ?Patient is a 61 y.o. male who was seen today for a cognitive linguistic evaluation after a referral from Dr. Narda Amber. Pt presents with impairments in attention, memory, motor speech, and language characterized by impaired immediate and delayed recall (benefits from category cues), sustained attention, verbal dysfluencies with initial phoneme and part word repetition, and reduced word finding in conversation. Pt works on a farm and he states that his primary goal is for him to be able to drive again. He tries to decrease his rate of speech and "relax" to improve verbal fluency. He indicates that he is not "bothered" by his speech, but is motivated to address it as well as memory deficits.   ? ?OBJECTIVE IMPAIRMENTS include attention, memory, executive functioning, and dysarthria. These impairments are limiting patient from managing appointments and effectively communicating at home and in community. ?Factors affecting potential to achieve goals and functional outcome are ability to learn/carryover information, co-morbidities, and severity of impairments.. Patient will benefit from skilled SLP services to address above impairments and improve overall function. ? ?REHAB POTENTIAL: Fair Diagnosis unknown, possibly degenerative ? ?PLAN: ?SLP FREQUENCY: 1x/week ? ?  SLP DURATION: 4 weeks ? ?PLANNED INTERVENTIONS: Cueing hierachy, Cognitive reorganization, Internal/external aids, Functional tasks, and SLP instruction and feedback ? ? ?Thank you, ? ?Genene Churn, [place] ?[phone] ? ?Letita Prentiss, CCC-SLP ?03/22/2022, 12:12 PM ? ? ? ?  ?

## 2022-03-30 NOTE — Addendum Note (Signed)
Addended by: Dorene Ar on: 03/30/2022 12:08 PM ? ? Modules accepted: Orders ? ?

## 2022-04-05 ENCOUNTER — Ambulatory Visit (HOSPITAL_COMMUNITY): Payer: 59 | Admitting: Physical Therapy

## 2022-04-12 ENCOUNTER — Ambulatory Visit (HOSPITAL_COMMUNITY): Payer: 59 | Attending: Neurology | Admitting: Occupational Therapy

## 2022-04-12 ENCOUNTER — Encounter (HOSPITAL_COMMUNITY): Payer: Self-pay | Admitting: Occupational Therapy

## 2022-04-12 DIAGNOSIS — R29898 Other symptoms and signs involving the musculoskeletal system: Secondary | ICD-10-CM | POA: Diagnosis not present

## 2022-04-12 DIAGNOSIS — M6281 Muscle weakness (generalized): Secondary | ICD-10-CM | POA: Insufficient documentation

## 2022-04-12 NOTE — Therapy (Signed)
?OUTPATIENT OCCUPATIONAL THERAPY ORTHO EVALUATION ? ?Patient Name: [name] ?MRN: [id number] ?DOB:03-24-1961, 61 y.o., male ?Today's Date: 04/12/2022 ? ?PCP: Dr. Lia Cooper ?REFERRING PROVIDER: Dr. Nita Cooper ? ? OT End of Session - 04/12/22 1511   ? ? Visit Number 1   ? Number of Visits 4   ? Date for OT Re-Evaluation 05/12/22   ? Authorization Type [employer], $70 copay   ? Authorization Time Period 30 visit limit PT/OT/SP combined   ? Authorization - Visit Number 2   ? Authorization - Number of Visits 30   ? Progress Note Due on Visit 10   ? OT Start Time 1030   ? OT Stop Time 1110   ? OT Time Calculation (min) 40 min   ? Activity Tolerance Patient tolerated treatment well   ? Behavior During Therapy [facility] for tasks assessed/performed   ? ?  ?  ? ?  ? ? ?Past Medical History:  ?Diagnosis Date  ? Clavicle fracture 07/27/2015  ? Eosinophilic esophagitis 2008  ? Previously treated with systemic corticosteroid therapy as the patient could not afford Flovent at that time. Patient was referred to the allergist but did not go to the appointment.  ? GERD (gastroesophageal reflux disease)   ? Seasonal allergies   ? ?Past Surgical History:  ?Procedure Laterality Date  ? ESOPHAGOGASTRODUODENOSCOPY  01/31/2007  ? [id number] food impaction/ Schatzki's ring, inflamed distal esophageal mucosa  ? ESOPHAGOGASTRODUODENOSCOPY  02/14/2007  ? RMR: Somewhat ribbed appearing esophageal mucosa (biopsy-eosinophilic esophagitis), prominent Schatzki ring status post dilation with Maloney dilators.  Pale raised esophageal nodule status post biopsy (eosinophilic esophagitis). Moderate sized hiatal hernia.   ? ESOPHAGOGASTRODUODENOSCOPY (EGD) WITH ESOPHAGEAL DILATION N/A 04/25/2013  ? [id number] reflux esophagitis/eosinophilic esophagitis biopsy proven/Hiatal hernia/Antral erosions. conservative esophageal dilation  ? ?Patient Active Problem List  ? Diagnosis Date Noted  ? Proximal weakness of limb 01/21/2022  ? GERD  (gastroesophageal reflux disease) 04/10/2013  ? Eosinophilic esophagitis 04/10/2013  ? Esophageal dysphagia 04/10/2013  ? ? ?ONSET DATE: 2-3 years ago (~2020) ? ?REFERRING DIAG: R29.898 (ICD-10-CM) - Bilateral arm weakness ? ?THERAPY DIAG:  ?Other symptoms and signs involving the musculoskeletal system ? ?Muscle weakness (generalized) ? ?SUBJECTIVE:  ? ?SUBJECTIVE STATEMENT: ?S: I've been getting weaker for a few years.  ?Pt accompanied by: self ? ?PERTINENT HISTORY: [name] is a 61 yo male who was referred by Dr. Nita Cooper for an occupational therapy evaluation for bilateral arm weakness. For the past 2-3 years, patient has developed bilateral arm weakness, specifically being unable to raise his arms at the shoulder and he was walking more hunched over.  Weakness has been progressive.  He also has weakness with trying to keep his head upright.  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  Over the past 6 months, his weakness progressed and he was unable to raise his arms to drive. He was hospitalized at [facility] from 2/25 - 2/27 with confusion and weakness.  He underwent MRI brain and cervical spine which was notable for moderate canal stenosis at C3-4, no significant abnormalities of the brain. Labs were notable for vitamin B12 and E deficiency. 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 He is also being [facility] for evaluation and management, as Dr. Allena KatzPatel will be out on medical leave. ? ?PRECAUTIONS: Fall ? ?WEIGHT BEARING RESTRICTIONS No ? ?PAIN:  ?Are you having pain? No ? ?FALLS: Has patient fallen in last 6 months? Yes. Number of falls at least 5 ? ?LIVING ENVIRONMENT: ?Lives with: lives alone ?Lives in: Mobile  home ?Stairs: Yes: External: 5 steps; can reach both ?Has following equipment at home: None ? ?PLOF: Independent with basic ADLs ? ?PATIENT GOALS Getting arms stronger ? ?OBJECTIVE:  ? ?HAND DOMINANCE: Right ? ?ADLs: ?Overall ADLs: Pt reports independence with ADLs but has significant difficulty due to mobility issues. Requires increased time with all tasks.  ?Transfers/ambulation related to ADLs: walks without device, hyperextension throughout back, head drops down to chest level. ? ? ?FUNCTIONAL OUTCOME MEASURES: ?Quick Dash: 56.82 ? ?UE ROM    ? ?A/ROM: pt is unable to lift shoulders from sides when seated. Elbow, wrist, forearm, hand ROM is WNL. ? ?Passive ROM Right ?04/12/2022 Left ?04/12/2022  ?Shoulder flexion 170 170  ?Shoulder abduction 170 170  ?Shoulder internal rotation 90 90  ?Shoulder external rotation 70 70  ?(Blank rows = not tested) ? ? ?UE MMT:    ? ?MMT Right ?04/12/2022 Left ?04/12/2022  ?Shoulder flexion 2-/5 2-/5  ?Shoulder abduction 2-/5 2-/5  ?Shoulder internal rotation 4/5 4/5  ?Shoulder external rotation 3-/5 3-/5  ?Elbow flexion 4+/5 4+/5  ?Elbow extension 4/5 4/5  ?Wrist flexion 5/5 5/5  ?Wrist extension 5/5 5/5  ?Wrist ulnar deviation 5/5 5/5  ?Wrist radial deviation 5/5 5/5  ?Wrist pronation 4+/5 4+/5  ?Wrist supination 4/5 4/5  ?(Blank rows = not tested) ? ?HAND FUNCTION: ?Grip strength: Right: 55 lbs; Left: 50 lbs, Lateral pinch: Right: 6 lbs, Left: 9 lbs, and 3 point pinch: Right: 9 lbs, Left: 9 lbs ? ?COORDINATION: ?Appears WFL-pt unable to complete forma testing due to bilateral shoulder weakness limiting ability to reach items on the table at waist level. ? ?SENSATION: ?Pt reports tingling along left ulnar hand/wrist ? ?COGNITION: ?Overall cognitive status: Difficulty to assess ?Areas of impairment: Areas of impairment: Attention, Memory, Following commands, Safety/judgement, Awareness, and Problem solving  ?Attention: Deficits difficulty with maintaining conversation and remaining on  task ?Memory: Deficits difficulty with timeline and sequencing ? ?OBSERVATIONS: While seated in chair, pt slouched with back and head propped against the wall. Pt using wall to hold head up, if sitting up straight head drops down to chest.  ? ? ? ?PATIENT EDUCATION: ?Education details: educated on POC ?Person educated: Patient ?Education method: Explanation ?Education comprehension: verbalized understanding ? ? ?HOME EXERCISE PROGRAM: ?Provide at next appt ? ?GOALS: ?Goals reviewed with patient? Yes ? ?SHORT TERM GOALS: Target date: 05/10/2022  (Remove Blue Hyperlink) ? ?Pt will be provided on HEP to improve ability to use BUE during ADLs.  ? ?Goal status: INITIAL ? ?2.  Pt will increase A/ROM of bilateral shoulders to 50% to improve ability to complete tasks at waist height. ? ?Goal status: INITIAL ? ?3.  Pt will increase bilateral shoulder strength to 3/5 to improve ability to reach for items at waist to chest height during functional tasks.  ? ?Goal status: INITIAL ? ?4.  Pt will be educated on AE and DME available to improve independence in ADL completion.  ? ?Goal status: INITIAL ? ? ? ? ? ?ASSESSMENT: ? ?CLINICAL IMPRESSION: ?Patient is a 61 y.o. male who was seen today for occupational therapy evaluation for bilateral arm weakness. 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 Pt has not had NCS at this time. Pt with severe bilateral UE weakness, unable to lift shoulders while in seated position, P/ROM is full.   ? ?PERFORMANCE DEFICITS in functional skills including ADLs, IADLs, ROM, strength, pain, fascial restrictions, and muscle spasms ? ?IMPAIRMENTS are limiting patient from ADLs, IADLs, work, and leisure.  ? ?COMORBIDITIES may have co-morbidities  that affects occupational performance. Patient will benefit from skilled OT to address above impairments and improve overall  function. ? ?MODIFICATION OR ASSISTANCE TO COMPLETE EVALUATION: Min-Moderate modification of tasks or assist with assess necessary to complete an evaluation. ? ?OT OCCUPATIONAL PROFILE AND HISTORY: Detailed asses

## 2022-04-13 ENCOUNTER — Ambulatory Visit: Payer: 59

## 2022-05-07 NOTE — Therapy (Signed)
OUTPATIENT PHYSICAL THERAPY NEURO EVALUATION   Patient Name: [name] MRN: [id number] DOB:29-Jun-1961, 61 y.o., male Today's Date: 05/08/2022   PCP: Dr. Clide Deutscher REFERRING PROVIDER: Alda Berthold, DO [facility]    PT End of Session - 05/08/22 1101     Visit Number 1    Number of Visits 3    Date for PT Re-Evaluation 05/29/22    Authorization Type [employer]; 30 visits combined for PT/OT/ST; $70 copay    Authorization Time Period auth required after visit 29    Authorization - Visit Number 1    Progress Note Due on Visit 3    PT Start Time 0950    PT Stop Time 1038    PT Time Calculation (min) 48 min             Past Medical History:  Diagnosis Date   Clavicle fracture [id number]   Eosinophilic esophagitis [id number]   Previously treated with systemic corticosteroid therapy as the patient could not afford Flovent at that time. Patient was referred to the allergist but did not go to the appointment.   GERD (gastroesophageal reflux disease)    Seasonal allergies    Past Surgical History:  Procedure Laterality Date   ESOPHAGOGASTRODUODENOSCOPY  01/31/2007   [id number] food impaction/ Schatzki's ring, inflamed distal esophageal mucosa   ESOPHAGOGASTRODUODENOSCOPY  02/14/2007   RMR: Somewhat ribbed appearing esophageal mucosa (biopsy-eosinophilic esophagitis), prominent Schatzki ring status post dilation with Maloney dilators.  Pale raised esophageal nodule status post biopsy (eosinophilic esophagitis). Moderate sized hiatal hernia.    ESOPHAGOGASTRODUODENOSCOPY (EGD) WITH ESOPHAGEAL DILATION N/A 04/25/2013   [id number] reflux esophagitis/eosinophilic esophagitis biopsy proven/Hiatal hernia/Antral erosions. 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 R47.1 (ICD-10-CM) - Dysarthria R41.89 (ICD-10-CM) - Cognitive impairment R29.898 (ICD-10-CM) - Bilateral leg weakness   THERAPY DIAG:  Abnormal posture  Difficulty in walking, not elsewhere classified  Low back pain, unspecified back pain laterality, unspecified chronicity, unspecified whether sciatica present  Muscle weakness (generalized)  Other symptoms and signs involving the musculoskeletal system  Other abnormalities of gait and mobility  Rationale for Evaluation and Treatment Rehabilitation  SUBJECTIVE:  SUBJECTIVE STATEMENT:  Difficult to understand patient; stutters; question some cognitive involvement?  mostly concerned about his arm strength. Reports that his weakness has gotten progressively worse over the last 2-3 years. He works on a farm. Has some low back pain; weakness is all over but more in neck and shoulders. Also seeing OT; has seen ST for eval Pt accompanied by: self  PERTINENT HISTORY:  He was hospitalized at [facility] from 2/25 - 2/27 with confusion and weakness.  He underwent MRI brain and cervical spine which was notable for moderate canal stenosis at C3-4, no significant abnormalities of the brain. Labs were notable for vitamin B12 and E deficiency. 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 He works on a farm as a Media planner.  He has been at the same place for 42 years. 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 He is also being referred to [facility] neuromuscular clinic for evaluation and management, as Dr. Posey Pronto will be out on medical leave.     PAIN:  Are you having pain? Yes: NPRS scale: 5-6/10 Pain location: low back Pain description: sore Aggravating factors: unknown; at night Relieving factors: rest  PRECAUTIONS: Fall  WEIGHT BEARING RESTRICTIONS No  FALLS: Has patient fallen in last 6 months? Yes. Number of falls 5 or more  LIVING ENVIRONMENT: Lives with: lives alone Lives in: Mobile home Stairs: Yes: External: 5 steps; on right going up, on left going up, and can reach both Has following equipment at home:  neck collar  PLOF: Independent  PATIENT GOALS to be able to drive.  OBJECTIVE:   DIAGNOSTIC FINDINGS: CLINICAL DATA:  Proximal muscle weakness   EXAM: MRI CERVICAL SPINE WITHOUT AND WITH CONTRAST   TECHNIQUE: Multiplanar and multiecho pulse sequences of the cervical spine, to include the craniocervical junction and cervicothoracic junction, were obtained without and with intravenous contrast.   CONTRAST:  4mL GADAVIST GADOBUTROL 1 MMOL/ML IV SOLN   COMPARISON:  None.   FINDINGS: Alignment: No significant listhesis.   Vertebrae: Vertebral body heights are maintained. No substantial marrow edema. No suspicious osseous lesion.   Cord: No abnormal signal.  No abnormal intrathecal enhancement.   Posterior Fossa, vertebral arteries, paraspinal tissues: Unremarkable. Intracranial structures evaluated separately.   Disc levels:   C2-C3: Punctate central annular fissure. Facet hypertrophy. No canal or foraminal stenosis.   C3-C4: Disc bulge with endplate osteophytes. Uncovertebral and facet hypertrophy. Thickening of the ligamentum flavum. Moderate canal stenosis. Mild to moderate foraminal stenosis, left greater than right.   C4-C5: Minimal disc bulge. Facet hypertrophy. No canal or foraminal stenosis.   C5-C6: Disc bulge with endplate osteophytes. Uncovertebral and facet hypertrophy. Mild canal stenosis. Moderate foraminal stenosis.   C6-C7: Disc bulge with endplate osteophytes. Small  superimposed right paracentral protrusion. Uncovertebral hypertrophy. Minor canal stenosis. Minor foraminal stenosis.   C7-T1:  Facet hypertrophy.  No canal or foraminal stenosis.   IMPRESSION: No abnormal cord signal or enhancement.   Multilevel degenerative changes as detailed above. Canal stenosis is greatest at C3-C4. Foraminal narrowing is greatest at C5-C6.     Electronically Signed   By: Macy Mis M.D.   On: 01/22/2022 17:44    COGNITION: Overall cognitive status: Difficulty to assess   SENSATION: WFL Some numbness reported at night 5th digit  COORDINATION: Impaired; decreased control of the neck especially noted; when he sits down he tried to fling his head up with momentum and hits his head on the wall  behind him.    MUSCLE TONE: patient with significant muscle wasting noted of the periscapular area bilaterally and generally is very thin; concave abdominals in supine; he states he is eating well.    POSTURE: rounded shoulders, forward head, increased thoracic kyphosis, left pelvic obliquity, and flexed trunk     CERVICAL ROM:   Active ROM AROM  eval  Flexion Sits with chin on chest  Extension Unable to extend neck in upright sitting  Right lateral flexion   Left lateral flexion   Right rotation   Left rotation    (Blank rows = not tested)   UPPER EXTREMITY MMT:  MMT Right eval Left eval  Shoulder flexion Unable to raise in sitting Unable to raise in sitting  Shoulder extension    Shoulder abduction    Shoulder adduction    Shoulder extension    Shoulder internal rotation    Shoulder external rotation    Middle trapezius    Lower trapezius    Elbow flexion 3+ 4  Elbow extension 4 3+  Wrist flexion    Wrist extension    Wrist ulnar deviation    Wrist radial deviation    Wrist pronation    Wrist supination    Grip strength     (Blank rows = not tested)    LOWER EXTREMITY MMT:    MMT Right Eval Left Eval  Hip flexion 4 4   Hip extension    Hip abduction    Hip adduction    Hip internal rotation    Hip external rotation    Knee flexion    Knee extension 4 4  Ankle dorsiflexion 4+ 4+  Ankle plantarflexion    Ankle inversion    Ankle eversion    (Blank rows = not tested)  BED MOBILITY:  Sit to supine SBA Supine to sit SBA   STAIRS:  Level of Assistance: CGA  Stair Negotiation Technique: Forwards with Single Rail on Right Single Rail on Left Bilateral Rails  Number of Stairs: 8   Height of Stairs: 4 inch and then 8 inch  Comments: head rests on chest; extends lumbar spine in order to see path/feet  GAIT: Gait pattern:  neck flexed to chest; extends low back and narrow BOS Distance walked: 278 ft Assistive device utilized: None Level of assistance: CGA Comments: difficult for patient to see where he is walking due to extreme neck flexion  FUNCTIONAL TESTs:  5 times sit to stand: 36 sec 2 minute walk test: 278 ft with CGA  TODAY'S TREATMENT:  Physical therapy evaluation, HEP instruction   PATIENT EDUCATION: Education details: HEP, plan of care Person educated: Patient Education method: Explanation, Demonstration, and Handouts Education comprehension: verbalized understanding, returned demonstration, and needs further education   HOME EXERCISE PROGRAM: Access Code: [email or web address] URL: [email or web address] Date: 05/08/2022 Prepared by: [facility] - Rehab  Exercises - Supine Bridge  - 2-3 x daily - 7 x weekly - 1 sets - 10 reps - Supine Scapular Retraction  - 2-3 x daily - 7 x weekly - 1 sets - 10 reps - Supine Single Arm Shoulder External Rotation AROM  - 2-3 x daily - 7 x weekly - 1 sets - 10 reps - Seated Shoulder Flexion Towel Slide at Table Top  - 2-3 x daily - 7 x weekly - 1 sets - 10 reps - Sit to Stand with Armchair  - 2-3 x daily - 7 x weekly - 1 sets - 10 reps    GOALS:  Goals reviewed with patient? No  SHORT TERM GOALS: Target date: 05/22/2022   patient will be  independent with initial HEP Baseline: Goal status: INITIAL  2.  Patient will be compliant with wearing a neck collar in order to put his head in a better position to see his walkway to decrease fall risk.  Baseline:  Goal status: INITIAL LONG TERM GOALS: Target date: 05/29/2022  Patient will improve 5 x STS score from 36 sec to 28  sec to demonstrate improved functional mobility and increased lower extremity strength.  Baseline: 36 sec Goal status: INITIAL  2.  Patient will increase 2MWT to 300 ft to demonstrate improved functional mobility Baseline: 278 ft Goal status: INITIAL  3.  Patient will be able to elevate arms to 45 degrees to improve his ability to grasp the steering wheel to drive.  Baseline: 0 Goal status: INITIAL  4.  Patient will remains free of falls Baseline:  Goal status: INITIAL  5.  Patient will be independent in self management strategies to improve quality of life and functional outcomes.  Baseline:  Goal status: INITIAL  ASSESSMENT:  CLINICAL IMPRESSION: Patient is a 61 y.o. gentleman who was seen today for physical therapy evaluation and treatment for bilateral lower extremity weakness. 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 Patient also with some speech impairment; stutters; question some cognitive involvement although patient is able to follow commands but sometimes discusses topics non-related to questioning. Patient will benefit from skilled physical therapy interventions to address deficits and promote optimal function.    OBJECTIVE IMPAIRMENTS Abnormal gait, decreased activity tolerance, decreased balance, decreased cognition, decreased coordination, decreased endurance, decreased knowledge of condition, decreased knowledge of use of DME, decreased  mobility, difficulty walking, decreased ROM, decreased strength, decreased safety awareness, hypomobility, impaired perceived functional ability, impaired flexibility, impaired tone, impaired UE functional use, improper body mechanics, postural dysfunction, and pain.   ACTIVITY LIMITATIONS carrying, lifting, bending, sitting, standing, squatting, sleeping, stairs, transfers, bed mobility, bathing, toileting, dressing, self feeding, reach over head, hygiene/grooming, locomotion level, and caring for others  PARTICIPATION LIMITATIONS: meal prep, cleaning, laundry, medication management, driving, shopping, community activity, occupation, and yard work   [employer] POTENTIAL: Good  CLINICAL DECISION MAKING: Evolving/moderate complexity  EVALUATION COMPLEXITY: Moderate  PLAN: PT FREQUENCY: 1x/week  PT DURATION: 3 weeks  PLANNED INTERVENTIONS: Therapeutic exercises, Therapeutic activity, Neuromuscular re-education, Balance training, Gait training, Patient/Family education, Joint manipulation, Joint mobilization, Stair training, Vestibular training, Canalith repositioning, Visual/preceptual remediation/compensation, Orthotic/Fit training, DME instructions, Aquatic Therapy, Dry Needling, Cognitive remediation, Electrical stimulation, Spinal manipulation, Spinal mobilization, Cryotherapy, Moist heat, Manual lymph drainage, Compression bandaging, scar mobilization, Splintting, Taping, Traction, Ultrasound, Parrafin, Contrast bath, Biofeedback, Ionotophoresis 4mg /ml Dexamethasone, Manual therapy, and Re-evaluation  PLAN FOR NEXT SESSION: Review goals and HEP.  Progress strengthening especially focus on postural strengthening. Balance; upper and lower extremity strengthening   11:08 AM, 05/08/22 Avelyn Touch Small Demarko Zeimet MPT [place] [facility] [place] [phone]

## 2022-05-08 ENCOUNTER — Ambulatory Visit (HOSPITAL_COMMUNITY): Payer: 59 | Attending: Neurology

## 2022-05-08 DIAGNOSIS — R471 Dysarthria and anarthria: Secondary | ICD-10-CM | POA: Diagnosis not present

## 2022-05-08 DIAGNOSIS — R293 Abnormal posture: Secondary | ICD-10-CM | POA: Insufficient documentation

## 2022-05-08 DIAGNOSIS — R29898 Other symptoms and signs involving the musculoskeletal system: Secondary | ICD-10-CM | POA: Insufficient documentation

## 2022-05-08 DIAGNOSIS — R262 Difficulty in walking, not elsewhere classified: Secondary | ICD-10-CM | POA: Diagnosis present

## 2022-05-08 DIAGNOSIS — R2689 Other abnormalities of gait and mobility: Secondary | ICD-10-CM | POA: Diagnosis present

## 2022-05-08 DIAGNOSIS — M6281 Muscle weakness (generalized): Secondary | ICD-10-CM | POA: Insufficient documentation

## 2022-05-08 DIAGNOSIS — R4189 Other symptoms and signs involving cognitive functions and awareness: Secondary | ICD-10-CM | POA: Insufficient documentation

## 2022-05-08 DIAGNOSIS — M545 Low back pain, unspecified: Secondary | ICD-10-CM | POA: Diagnosis present

## 2022-05-11 ENCOUNTER — Ambulatory Visit (HOSPITAL_COMMUNITY): Payer: 59 | Admitting: Occupational Therapy

## 2022-05-11 ENCOUNTER — Encounter (HOSPITAL_COMMUNITY): Payer: Self-pay | Admitting: Occupational Therapy

## 2022-05-11 DIAGNOSIS — R293 Abnormal posture: Secondary | ICD-10-CM | POA: Diagnosis not present

## 2022-05-11 DIAGNOSIS — M6281 Muscle weakness (generalized): Secondary | ICD-10-CM

## 2022-05-11 DIAGNOSIS — R29898 Other symptoms and signs involving the musculoskeletal system: Secondary | ICD-10-CM

## 2022-05-11 NOTE — Therapy (Signed)
OUTPATIENT OCCUPATIONAL THERAPY ORTHO REASSESSMENT, TREATMENT RECERTIFICATION  Patient Name: [name] MRN: [id number] DOB:10/15/1961, 61 y.o., male Today's Date: 05/12/2022  PCP: Dr. Lia Hopping REFERRING PROVIDER: Dr. Nita Sickle   OT End of Session - 05/12/22 1633     Visit Number 2    Number of Visits 10    Date for OT Re-Evaluation 06/11/22    Authorization Type [employer], $70 copay    Authorization Time Period 30 visit limit PT/OT/SP combined    Authorization - Visit Number 3    Authorization - Number of Visits 30    Progress Note Due on Visit 10    OT Start Time [phone]    OT Stop Time 1028    OT Time Calculation (min) 40 min    Activity Tolerance Patient tolerated treatment well    Behavior During Therapy [facility] for tasks assessed/performed              Past Medical History:  Diagnosis Date   Clavicle fracture 07/27/2015   Eosinophilic esophagitis 2008   Previously treated with systemic corticosteroid therapy as the patient could not afford Flovent at that time. Patient was referred to the allergist but did not go to the appointment.   GERD (gastroesophageal reflux disease)    Seasonal allergies    Past Surgical History:  Procedure Laterality Date   ESOPHAGOGASTRODUODENOSCOPY  01/31/2007   [id number] food impaction/ Schatzki's ring, inflamed distal esophageal mucosa   ESOPHAGOGASTRODUODENOSCOPY  02/14/2007   RMR: Somewhat ribbed appearing esophageal mucosa (biopsy-eosinophilic esophagitis), prominent Schatzki ring status post dilation with Maloney dilators.  Pale raised esophageal nodule status post biopsy (eosinophilic esophagitis). Moderate sized hiatal hernia.    ESOPHAGOGASTRODUODENOSCOPY (EGD) WITH ESOPHAGEAL DILATION N/A 04/25/2013   CBJ:[id number] reflux esophagitis/eosinophilic esophagitis biopsy proven/Hiatal hernia/Antral erosions. conservative esophageal dilation   Patient Active Problem List   Diagnosis Date Noted   Proximal weakness of  limb 01/21/2022   GERD (gastroesophageal reflux disease) 04/10/2013   Eosinophilic esophagitis 04/10/2013   Esophageal dysphagia 04/10/2013    ONSET DATE: 2-3 years ago (~2020)  REFERRING DIAG: R29.898 (ICD-10-CM) - Bilateral arm weakness  THERAPY DIAG:  Other symptoms and signs involving the musculoskeletal system  Muscle weakness (generalized)  SUBJECTIVE:   SUBJECTIVE STATEMENT: S: I've been getting weaker for a few years.  Pt accompanied by: self  PERTINENT HISTORY: [name] is a 61 yo male who was referred by Dr. Nita Sickle for an occupational therapy evaluation for bilateral arm weakness. For the past 2-3 years, patient has developed bilateral arm weakness, specifically being unable to raise his arms at the shoulder and he was walking more hunched over.  Weakness has been progressive.  He also has weakness with trying to keep his head upright.  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  Over the past 6 months, his weakness progressed and he was unable to raise his arms to drive. He was hospitalized at [facility] from 2/25 - 2/27 with confusion and weakness.  He underwent MRI brain and cervical spine which was notable for moderate canal stenosis at C3-4, no significant abnormalities of the brain. Labs were notable for vitamin B12 and E deficiency. 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 He is also being  referred to [facility] neuromuscular clinic for evaluation and management, as Dr. Posey Pronto will be out on medical leave.  PRECAUTIONS: Fall  WEIGHT BEARING RESTRICTIONS No  PAIN:  Are you having pain? Yes: NPRS scale: 5/10 Pain location: bilateral forearms Pain description: sore Aggravating factors: tense from max effort  of use Relieving factors: unsure  FALLS: Has patient fallen in last 6 months? Yes. Number of falls at least 5  LIVING ENVIRONMENT: Lives with: lives alone Lives in: Mobile home Stairs: Yes: External: 5 steps; can reach both Has following equipment at home: None  PLOF: Independent with basic ADLs  PATIENT GOALS Getting arms stronger  OBJECTIVE:     FUNCTIONAL OUTCOME MEASURES: Quick Dash: 56.82  UE ROM     A/ROM: pt is unable to lift shoulders from sides when seated. Elbow, wrist, forearm, hand ROM is WNL.  Passive ROM Right 04/12/2022 Right 05/11/22 Left 04/12/2022 Left  05/11/22  Shoulder flexion 170 170 170 170  Shoulder abduction 170 170 170 170  Shoulder internal rotation 90 90 90 90  Shoulder external rotation 70 45 70 50  (Blank rows = not tested)   UE MMT:     MMT Right 04/12/2022 Right  05/11/22 Left 04/12/2022 Left  05/11/22  Shoulder flexion 2-/5 2-/5 2-/5 2-/5  Shoulder abduction 2-/5 2-/5 2-/5 2-/5  Shoulder internal rotation 4/5 4/5 4/5 4/5  Shoulder external rotation 3-/5 3-/5 3-/5 3-/5  Elbow flexion 4+/5 4+/5 4+/5 4+/5  Elbow extension 4/5 4/5 4/5 4/5  Wrist flexion 5/5 5/5 5/5 5/5  Wrist extension 5/5 5/5 5/5 5/5  Wrist ulnar deviation 5/5 5/5 5/5 5/5  Wrist radial deviation 5/5 5/5 5/5 5/5  Wrist pronation 4+/5 4+/5 4+/5 4+/5  Wrist supination 4/5 4/5 4/5 4/5  (Blank rows = not tested)  HAND FUNCTION: Grip strength: Right: 50 (55 previous) lbs; Left: 56 (50 previous) lbs, Lateral pinch: Right: 7 (6 previous) lbs, Left: 6 (9 previous) lbs, and 3 point pinch: Right: 7 (9 previous) lbs, Left: 4 (9 previous) lbs  COORDINATION: Appears WFL-pt unable to complete formal testing due to bilateral shoulder weakness limiting ability to reach items on the table at waist level.  SENSATION: Pt reports tingling along left ulnar hand/wrist  COGNITION: Overall cognitive status: Difficulty to assess Areas of impairment: Areas of impairment: Attention,  Memory, Following commands, Safety/judgement, Awareness, and Problem solving  Attention: Deficits difficulty with maintaining conversation and remaining on task Memory: Deficits difficulty with timeline and sequencing    TODAY'S TREATMENT: 05/11/22 -Isometric Strengthening: Pt in supine; 3x3" for flexion, internal rotation, external rotation; max effort and max verbal and tactile cuing for success. Rest breaks needed throughout, increased time for working through the rest of the exercises.  -Trialed AA/ROM in supine using dowel rod-pt able to complete 5 reps of flexion and protraction with min/mod assist for lifting bar -Discussed ADLs with pt who reports he is doing what he can. Educated on assistance available such as a home health aide.  -Donned soft cervical collar-posture and head positioning somewhat improved. Educated pt on benefits of wearing soft collar for improved posture, decreased back pain, increased neck stability. Pt reports he has it but does not wear it very often.      PATIENT EDUCATION: Education details: educated on POC Person educated: Patient Education method: Explanation Education comprehension: verbalized understanding   HOME EXERCISE PROGRAM: Provide at next appt  GOALS: Goals reviewed with patient? Yes  SHORT TERM GOALS: Target date: 06/09/2022   Pt will  be provided on HEP to improve ability to use BUE during ADLs.   Goal status: ONGOING  2.  Pt will increase A/ROM of bilateral shoulders to 50% to improve ability to complete tasks at waist height.  Goal status: ONGOING  3.  Pt will increase bilateral shoulder strength to 3/5 to improve ability to reach for items at waist to chest height during functional tasks.   Goal status: ONGOING  4.  Pt will be educated on AE and DME available to improve independence in ADL completion.   Goal status: ONGOING      ASSESSMENT:  CLINICAL IMPRESSION: Reassessment completed this session, pt has not been  to therapy since evaluation. Pt has had minimal change in his ROM and strength, OT does note slightly decreased strength in bilateral grip and pinch scores. Pt with severe muscle atrophy and inability to lift his arms, however can slide forward on his legs into flexion. Discussed observations with pt, including importance of supporting his neck with his cervical collar. Pt verbalized understanding. OT also educating on assistance available to pt, however pt feels he is getting important daily tasks done and does not need help personally.      PLAN: OT FREQUENCY: 2x/week  OT DURATION: 4 weeks  PLANNED INTERVENTIONS: self care/ADL training, therapeutic exercise, therapeutic activity, neuromuscular re-education, manual therapy, passive range of motion, electrical stimulation, patient/family education, and DME and/or AE instructions  RECOMMENDED OTHER SERVICES: Nerve conduction study, PT, Speech services  CONSULTED AND AGREED WITH PLAN OF CARE: Patient  PLAN FOR NEXT SESSION: Continue with skilled interventions targeting BUE ROM and strength, adaptive ADL techniques.    Ezra Sites, OTR/L  [phone] 05/12/2022, 4:35 PM

## 2022-05-15 ENCOUNTER — Ambulatory Visit (HOSPITAL_COMMUNITY): Payer: 59 | Admitting: Occupational Therapy

## 2022-05-15 ENCOUNTER — Encounter (HOSPITAL_COMMUNITY): Payer: Self-pay | Admitting: Occupational Therapy

## 2022-05-15 DIAGNOSIS — M6281 Muscle weakness (generalized): Secondary | ICD-10-CM

## 2022-05-15 DIAGNOSIS — R293 Abnormal posture: Secondary | ICD-10-CM | POA: Diagnosis not present

## 2022-05-15 DIAGNOSIS — R29898 Other symptoms and signs involving the musculoskeletal system: Secondary | ICD-10-CM

## 2022-05-15 NOTE — Therapy (Signed)
OUTPATIENT OCCUPATIONAL THERAPY ORTHO TREATMENT  Patient Name: [name] MRN: [id number] DOB:03-08-1961, 61 y.o., male Today's Date: 05/15/2022  PCP: Dr. Stoney Bang REFERRING PROVIDER: Dr. Narda Amber   OT End of Session - 05/15/22 1044     Visit Number 3    Number of Visits 10    Date for OT Re-Evaluation 06/11/22    Authorization Type [employer], $70 copay    Authorization Time Period 30 visit limit PT/OT/SP combined    Authorization - Visit Number 3    Authorization - Number of Visits 30    Progress Note Due on Visit 10    OT Start Time 1031    OT Stop Time 1110    OT Time Calculation (min) 39 min    Activity Tolerance Patient tolerated treatment well    Behavior During Therapy [facility] for tasks assessed/performed               Past Medical History:  Diagnosis Date   Clavicle fracture [id number]   Eosinophilic esophagitis [id number]   Previously treated with systemic corticosteroid therapy as the patient could not afford Flovent at that time. Patient was referred to the allergist but did not go to the appointment.   GERD (gastroesophageal reflux disease)    Seasonal allergies    Past Surgical History:  Procedure Laterality Date   ESOPHAGOGASTRODUODENOSCOPY  01/31/2007   [id number] food impaction/ Schatzki's ring, inflamed distal esophageal mucosa   ESOPHAGOGASTRODUODENOSCOPY  02/14/2007   RMR: Somewhat ribbed appearing esophageal mucosa (biopsy-eosinophilic esophagitis), prominent Schatzki ring status post dilation with Maloney dilators.  Pale raised esophageal nodule status post biopsy (eosinophilic esophagitis). Moderate sized hiatal hernia.    ESOPHAGOGASTRODUODENOSCOPY (EGD) WITH ESOPHAGEAL DILATION N/A 04/25/2013   [id number] reflux esophagitis/eosinophilic esophagitis biopsy proven/Hiatal hernia/Antral erosions. conservative esophageal dilation   Patient Active Problem List   Diagnosis Date Noted   Proximal weakness of limb 01/21/2022   GERD  (gastroesophageal reflux disease) [id number]   Eosinophilic esophagitis [id number]   Esophageal dysphagia 04/10/2013    ONSET DATE: 2-3 years ago (~2020)  REFERRING DIAG: R29.898 (ICD-10-CM) - Bilateral arm weakness  THERAPY DIAG:  Other symptoms and signs involving the musculoskeletal system  Muscle weakness (generalized)  SUBJECTIVE:   SUBJECTIVE STATEMENT: S: I am sore in my arms Pt accompanied by: self  PERTINENT HISTORY: [name] is a 61 yo male who was referred by Dr. Narda Amber for an occupational therapy evaluation for bilateral arm weakness. For the past 2-3 years, patient has developed bilateral arm weakness, specifically being unable to raise his arms at the shoulder and he was walking more hunched over.  Weakness has been progressive.  He also has weakness with trying to keep his head upright.  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  Over the past 6 months, his weakness progressed and he was unable to raise his arms to drive. He was hospitalized at [facility] from 2/25 - 2/27 with confusion and weakness.  He underwent MRI brain and cervical spine which was notable for moderate canal stenosis at C3-4, no significant abnormalities of the brain. Labs were notable for vitamin B12 and E deficiency. 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 He works on a farm as a Media planner.  He has been at the same place for 42 years. 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 He is also being referred to [facility]  neuromuscular clinic for evaluation and management, as Dr. Allena Katz will be out on medical leave.  PRECAUTIONS: Fall  WEIGHT BEARING RESTRICTIONS No  PAIN:  Are you having pain? Yes: NPRS scale: 5/10 Pain location: bilateral forearms Pain description: sore Aggravating factors: tense from max effort of use Relieving factors:  unsure   OBJECTIVE:     FUNCTIONAL OUTCOME MEASURES: Quick Dash: 56.82  UE ROM     A/ROM: pt is unable to lift shoulders from sides when seated. Elbow, wrist, forearm, hand ROM is WNL.  Passive ROM Right 04/12/2022 Right 05/11/22 Left 04/12/2022 Left  05/11/22  Shoulder flexion 170 170 170 170  Shoulder abduction 170 170 170 170  Shoulder internal rotation 90 90 90 90  Shoulder external rotation 70 45 70 50  (Blank rows = not tested)   UE MMT:     MMT Right 04/12/2022 Right  05/11/22 Left 04/12/2022 Left  05/11/22  Shoulder flexion 2-/5 2-/5 2-/5 2-/5  Shoulder abduction 2-/5 2-/5 2-/5 2-/5  Shoulder internal rotation 4/5 4/5 4/5 4/5  Shoulder external rotation 3-/5 3-/5 3-/5 3-/5  Elbow flexion 4+/5 4+/5 4+/5 4+/5  Elbow extension 4/5 4/5 4/5 4/5  Wrist flexion 5/5 5/5 5/5 5/5  Wrist extension 5/5 5/5 5/5 5/5  Wrist ulnar deviation 5/5 5/5 5/5 5/5  Wrist radial deviation 5/5 5/5 5/5 5/5  Wrist pronation 4+/5 4+/5 4+/5 4+/5  Wrist supination 4/5 4/5 4/5 4/5  (Blank rows = not tested)  HAND FUNCTION: Grip strength: Right: 50 (55 previous) lbs; Left: 56 (50 previous) lbs, Lateral pinch: Right: 7 (6 previous) lbs, Left: 6 (9 previous) lbs, and 3 point pinch: Right: 7 (9 previous) lbs, Left: 4 (9 previous) lbs  COORDINATION: Appears WFL-pt unable to complete formal testing due to bilateral shoulder weakness limiting ability to reach items on the table at waist level.  SENSATION: Pt reports tingling along left ulnar hand/wrist  COGNITION: Overall cognitive status: Difficulty to assess Areas of impairment: Areas of impairment: Attention, Memory, Following commands, Safety/judgement, Awareness, and Problem solving  Attention: Deficits difficulty with maintaining conversation and remaining on task Memory: Deficits difficulty with timeline and sequencing    TODAY'S TREATMENT: 05/15/22 -Isometric Strengthening: Pt in supine; 5x5" for bilateral shoulder flexion, shoulder  extension, abduction -AA/ROM: Pt in supine, 2x5 reps protraction with dowel. Therapist providing min assist with protraction, mod assist for slight hold at end range, and mod assist with descent. Shoulder flexion 2x5 repetitions, therapist providing mod assistance with both ascent and descent. Max verbal cues for success. -AA/ROM Row: using two dowel rods, therapist facilitating forward protraction and scapular row. OT providing mod assist throughout task for mobility and positioning. 1x10 reps. -A/ROM: Pt in supine, 2x5 reps elbow flexion/extension with therapist providing constant verbal cues for breath work. -Functional Reaching: Boomwacker, pt reaching into various planes to hit therapists boomwacker with his own. Max effort, max difficulty reaching past 25%  range. 10-15 reps with each UE, 5 reps with BUE.    05/11/22 -Isometric Strengthening: Pt in supine; 3x3" for flexion, internal rotation, external rotation; max effort and max verbal and tactile cuing for success. Rest breaks needed throughout, increased time for working through the rest of the exercises.  -Trialed AA/ROM in supine using dowel rod-pt able to complete 5 reps of flexion and protraction with min/mod assist for lifting bar -Discussed ADLs with pt who reports he is doing what he can. Educated on assistance available such as a home health aide.  -Donned soft cervical collar-posture and  head positioning somewhat improved. Educated pt on benefits of wearing soft collar for improved posture, decreased back pain, increased neck stability. Pt reports he has it but does not wear it very often.      PATIENT EDUCATION: Education details: educated importance of breathing during tasks Person educated: Patient Education method: Explanation Education comprehension: verbalized understanding   HOME EXERCISE PROGRAM: Provide at next appt-if appropriate  GOALS: Goals reviewed with patient? Yes  SHORT TERM GOALS: Target date:  06/09/2022   Pt will be provided on HEP to improve ability to use BUE during ADLs.   Goal status: ONGOING  2.  Pt will increase A/ROM of bilateral shoulders to 50% to improve ability to complete tasks at waist height.  Goal status: ONGOING  3.  Pt will increase bilateral shoulder strength to 3/5 to improve ability to reach for items at waist to chest height during functional tasks.   Goal status: ONGOING  4.  Pt will be educated on AE and DME available to improve independence in ADL completion.   Goal status: ONGOING      ASSESSMENT:  CLINICAL IMPRESSION: Completed isometric strengthening with pt continuing to require max verbal and tactile cues for positioning, form, and breathing throughout. Noted dominant RUE slightly stronger than LUE. Completed AA/ROM protraction and flexion with dowel, requiring therapist assistance to complete movement and noted increased fatigue with final repetitions. Provided education on coordinating breath work with ROM exercises with some increased fluidity of movement noted. Pt utilized moderate compensation patterns to complete reaching activity, increased success when using BUE. OT notes ability to isolate specific muscles for movement with max cuing and facilitation, including breath work. HEP not appropriate at this time due to difficulty with form and technique.       PLAN: OT FREQUENCY: 2x/week  OT DURATION: 4 weeks  PLANNED INTERVENTIONS: self care/ADL training, therapeutic exercise, therapeutic activity, neuromuscular re-education, manual therapy, passive range of motion, electrical stimulation, patient/family education, and DME and/or AE instructions  RECOMMENDED OTHER SERVICES: Nerve conduction study, PT, Speech services  CONSULTED AND AGREED WITH PLAN OF CARE: Patient  PLAN FOR NEXT SESSION: Continue with breath work, AA/ROM, functional reaching   [employer], OTR/L  [phone] 05/15/2022, 11:14 AM

## 2022-05-17 ENCOUNTER — Ambulatory Visit (HOSPITAL_COMMUNITY): Payer: 59 | Admitting: Physical Therapy

## 2022-05-17 DIAGNOSIS — R262 Difficulty in walking, not elsewhere classified: Secondary | ICD-10-CM

## 2022-05-17 DIAGNOSIS — R293 Abnormal posture: Secondary | ICD-10-CM

## 2022-05-17 DIAGNOSIS — M545 Low back pain, unspecified: Secondary | ICD-10-CM

## 2022-05-17 NOTE — Therapy (Signed)
OUTPATIENT PHYSICAL THERAPY TREATMENT   Patient Name: [name] MRN: [id number] DOB:02-12-1961, 61 y.o., male Today's Date: 05/17/2022   PCP: Dr. Rich Reining REFERRING PROVIDER: Glendale Chard, DO [facility]    PT End of Session - 05/17/22 1202     Visit Number 2    Number of Visits 3    Date for PT Re-Evaluation 05/29/22    Authorization Type [employer]; 30 visits combined for PT/OT/ST; $70 copay    Authorization Time Period auth required after visit 29    Authorization - Visit Number 2    Authorization - Number of Visits 30    Progress Note Due on Visit 3    PT Start Time 1048    PT Stop Time 1132    PT Time Calculation (min) 44 min              Past Medical History:  Diagnosis Date   Clavicle fracture 07/27/2015   Eosinophilic esophagitis 2008   Previously treated with systemic corticosteroid therapy as the patient could not afford Flovent at that time. Patient was referred to the allergist but did not go to the appointment.   GERD (gastroesophageal reflux disease)    Seasonal allergies    Past Surgical History:  Procedure Laterality Date   ESOPHAGOGASTRODUODENOSCOPY  01/31/2007   DGU:[id number] food impaction/ Schatzki's ring, inflamed distal esophageal mucosa   ESOPHAGOGASTRODUODENOSCOPY  02/14/2007   RMR: Somewhat ribbed appearing esophageal mucosa (biopsy-eosinophilic esophagitis), prominent Schatzki ring status post dilation with Maloney dilators.  Pale raised esophageal nodule status post biopsy (eosinophilic esophagitis). Moderate sized hiatal hernia.    ESOPHAGOGASTRODUODENOSCOPY (EGD) WITH ESOPHAGEAL DILATION N/A 04/25/2013   DGL:OVFIEPP reflux esophagitis/eosinophilic esophagitis biopsy proven/Hiatal hernia/Antral erosions. 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 R47.1 (ICD-10-CM) - Dysarthria R41.89 (ICD-10-CM) - Cognitive impairment R29.898 (ICD-10-CM) - Bilateral leg weakness   THERAPY DIAG:  Abnormal posture  Difficulty in walking, not elsewhere classified  Low back pain, unspecified back pain laterality, unspecified chronicity, unspecified whether sciatica present  Rationale for Evaluation and Treatment Rehabilitation  SUBJECTIVE:                                                                                                                                                                                              SUBJECTIVE STATEMENT:  Difficult to understand patient (stutters, gets off task).  Pt comes today carrying his neck collar, admits to not wearing it as he needs to get used to it. States he's been doing some of his exercises but not as he should.   Pt reports he is not currently driving; transportation brought him today. Also seeing OT; has seen ST for eval Pt accompanied by: self  PERTINENT HISTORY:  He was hospitalized at [facility] from 2/25 - 2/27 with confusion and weakness.  He underwent MRI brain and cervical spine which was notable for moderate canal stenosis at C3-4, no significant abnormalities of the brain. Labs were notable for vitamin B12 and E deficiency. 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 He is also being referred to [facility] neuromuscular clinic for evaluation and management, as Dr. Allena Katz will be out on medical leave.    PAIN:  Are you having pain? Yes: NPRS scale: 5-6/10 Pain location: low back Pain description:  sore Aggravating factors: unknown; at night Relieving factors: rest  PRECAUTIONS: Fall  WEIGHT BEARING RESTRICTIONS No  FALLS: Has patient fallen in last 6 months? Yes. Number of falls 5 or more  LIVING ENVIRONMENT: Lives with: lives alone Lives in: Mobile home Stairs: Yes: External: 5 steps; on right going up, on left going up, and can reach both Has following equipment at home:  neck collar  PLOF: Independent  PATIENT GOALS to be able to drive.  OBJECTIVE:   DIAGNOSTIC FINDINGS: CLINICAL DATA:  Proximal muscle weakness   EXAM: MRI CERVICAL SPINE WITHOUT AND WITH CONTRAST   TECHNIQUE: Multiplanar and multiecho pulse sequences of the cervical spine, to include the craniocervical junction and cervicothoracic junction, were obtained without and with intravenous contrast.   CONTRAST:  66mL GADAVIST GADOBUTROL 1 MMOL/ML IV SOLN   COMPARISON:  None.   FINDINGS: Alignment: No significant listhesis.   Vertebrae: Vertebral body heights are maintained. No substantial marrow edema. No suspicious osseous lesion.   Cord: No abnormal signal.  No abnormal intrathecal enhancement.   Posterior Fossa, vertebral arteries, paraspinal tissues: Unremarkable. Intracranial structures evaluated separately.   Disc levels:   C2-C3: Punctate central annular fissure. Facet hypertrophy. No canal or foraminal stenosis.   C3-C4: Disc bulge with endplate osteophytes. Uncovertebral and facet hypertrophy. Thickening of the ligamentum flavum. Moderate canal stenosis. Mild to moderate foraminal stenosis, left greater than right.   C4-C5: Minimal disc bulge. Facet hypertrophy. No canal or foraminal stenosis.   C5-C6: Disc bulge with endplate osteophytes. Uncovertebral and facet hypertrophy. Mild canal stenosis. Moderate foraminal stenosis.   C6-C7: Disc bulge with endplate osteophytes. Small superimposed right paracentral protrusion. Uncovertebral hypertrophy. Minor canal stenosis. Minor  foraminal stenosis.   C7-T1:  Facet hypertrophy.  No canal or foraminal stenosis.   IMPRESSION: No abnormal cord signal or enhancement.   Multilevel degenerative changes as detailed above. Canal stenosis is greatest at C3-C4. Foraminal narrowing is greatest at C5-C6.     Electronically Signed   By: Guadlupe Spanish M.D.   On: 01/22/2022 17:44    COGNITION: Overall cognitive status: Difficulty to assess   SENSATION: WFL Some numbness reported at night 5th digit  COORDINATION: Impaired; decreased control of the neck especially noted; when he sits down he tried to fling his head up with momentum and hits his head  on the wall behind him.    MUSCLE TONE: patient with significant muscle wasting noted of the periscapular area bilaterally and generally is very thin; concave abdominals in supine; he states he is eating well.    POSTURE: rounded shoulders, forward head, increased thoracic kyphosis, left pelvic obliquity, and flexed trunk     CERVICAL ROM:   Active ROM AROM  eval  Flexion Sits with chin on chest  Extension Unable to extend neck in upright sitting  Right lateral flexion   Left lateral flexion   Right rotation   Left rotation    (Blank rows = not tested)   UPPER EXTREMITY MMT:  MMT Right eval Left eval  Shoulder flexion Unable to raise in sitting Unable to raise in sitting  Shoulder extension    Shoulder abduction    Shoulder adduction    Shoulder extension    Shoulder internal rotation    Shoulder external rotation    Middle trapezius    Lower trapezius    Elbow flexion 3+ 4  Elbow extension 4 3+  Wrist flexion    Wrist extension    Wrist ulnar deviation    Wrist radial deviation    Wrist pronation    Wrist supination    Grip strength     (Blank rows = not tested)    LOWER EXTREMITY MMT:    MMT Right Eval Left Eval  Hip flexion 4 4  Hip extension    Hip abduction    Hip adduction    Hip internal rotation    Hip external rotation     Knee flexion    Knee extension 4 4  Ankle dorsiflexion 4+ 4+  Ankle plantarflexion    Ankle inversion    Ankle eversion    (Blank rows = not tested)  BED MOBILITY:  Sit to supine SBA Supine to sit SBA   STAIRS:  Level of Assistance: CGA  Stair Negotiation Technique: Forwards with Single Rail on Right Single Rail on Left Bilateral Rails  Number of Stairs: 8   Height of Stairs: 4 inch and then 8 inch  Comments: head rests on chest; extends lumbar spine in order to see path/feet  GAIT: Gait pattern:  neck flexed to chest; extends low back and narrow BOS Distance walked: 278 ft Assistive device utilized: None Level of assistance: CGA Comments: difficult for patient to see where he is walking due to extreme neck flexion  FUNCTIONAL TESTs:  5 times sit to stand: 36 sec 2 minute walk test: 278 ft with CGA  TODAY'S TREATMENT:    05/17/22   Goal review; HEP review   Seated:  maintaining upright posturing    Isometric cervical extension (head press) into pillow/wall behind10X10"    AROM bil hamstring curls 10X    AROM bil hammer curls 10X    Sit to stands keeping head upright with min assist from therapist 5X, eccentric control   Supine:  isometric cervical extension (head press) into pillow 10X10"    Bridge 10X, AROM    Scapular retraction (elbows into mat) 10X    SLR 10X each side, AROM    Grasp hands push up, down and back 10X     Eval: Physical therapy evaluation, HEP instruction   PATIENT EDUCATION: Education details: 6/21:  goal review, cervical collar education   evaluation: HEP, plan of care Person educated: Patient Education method: Explanation, Demonstration, and Handouts Education comprehension: verbalized understanding, returned demonstration, and needs further education   HOME EXERCISE  PROGRAM: Access Code: [email or web address] URL: [email or web address] Date: 05/08/2022 Prepared by: AP - Rehab  Exercises - Supine Bridge  - 2-3 x daily - 7 x  weekly - 1 sets - 10 reps - Supine Scapular Retraction  - 2-3 x daily - 7 x weekly - 1 sets - 10 reps - Supine Single Arm Shoulder External Rotation AROM  - 2-3 x daily - 7 x weekly - 1 sets - 10 reps - Seated Shoulder Flexion Towel Slide at Table Top  - 2-3 x daily - 7 x weekly - 1 sets - 10 reps - Sit to Stand with Armchair  - 2-3 x daily - 7 x weekly - 1 sets - 10 reps    GOALS: Goals reviewed with patient? Yes  SHORT TERM GOALS: Target date: 05/22/2022   patient will be independent with initial HEP Baseline: Goal status: IN PROGRESS  2.  Patient will be compliant with wearing a neck collar in order to put his head in a better position to see his walkway to decrease fall risk.  Baseline:  Goal status: IN PROGRESS  LONG TERM GOALS: Target date: 05/29/2022  Patient will improve 5 x STS score from 36 sec to 28  sec to demonstrate improved functional mobility and increased lower extremity strength.  Baseline: 36 sec Goal status: IN PROGRESS  2.  Patient will increase 2MWT to 300 ft to demonstrate improved functional mobility Baseline: 278 ft Goal status: IN PROGRESS  3.  Patient will be able to elevate arms to 45 degrees to improve his ability to grasp the steering wheel to drive.  Baseline: 0 Goal status: IN PROGRESS  4.  Patient will remains free of falls Baseline:  Goal status: IN PROGRESS  5.  Patient will be independent in self management strategies to improve quality of life and functional outcomes.  Baseline:  Goal status: IN PROGRESS  ASSESSMENT:  CLINICAL IMPRESSION: Reviewed goals, POC moving forward.  Discussed HEP progress and use of cervical collar.  Pt reports he has not been the best at completing his exercises to this point.  Came today carrying his cervical collar.  Pt required redirection during session to focus on task as tends to become chatty at times.  Began working on keeping in more erect alignment with head over shoulders.  Pt with poor endurance  and eccentric control with head flopping back down.  Pt able to maintain upright control for 2/5 sit to stands, Min assist from therapist to maintain balance.  Max cues for eccentric lowering, overall control, hold times of therex and completing therex slowly.  Difficulty getting correct mm activation, especially with scap retraction. follow commands but sometimes discusses topics non-related to questioning. Patient will benefit from skilled physical therapy interventions to address deficits and promote optimal function.    OBJECTIVE IMPAIRMENTS Abnormal gait, decreased activity tolerance, decreased balance, decreased cognition, decreased coordination, decreased endurance, decreased knowledge of condition, decreased knowledge of use of DME, decreased mobility, difficulty walking, decreased ROM, decreased strength, decreased safety awareness, hypomobility, impaired perceived functional ability, impaired flexibility, impaired tone, impaired UE functional use, improper body mechanics, postural dysfunction, and pain.   ACTIVITY LIMITATIONS carrying, lifting, bending, sitting, standing, squatting, sleeping, stairs, transfers, bed mobility, bathing, toileting, dressing, self feeding, reach over head, hygiene/grooming, locomotion level, and caring for others  PARTICIPATION LIMITATIONS: meal prep, cleaning, laundry, medication management, driving, shopping, community activity, occupation, and yard work   [facility] P[facility]: Good  CLINICAL DECISION MAKING: Evolving/moderate complexity  EVALUATION COMPLEXITY: Moderate  PLAN:  PT FREQUENCY: 1x/week  PT DURATION: 3 weeks  PLANNED INTERVENTIONS: Therapeutic exercises, Therapeutic activity, Neuromuscular re-education, Balance training, Gait training, Patient/Family education, Joint manipulation, Joint mobilization, Stair training, Vestibular training, Canalith repositioning, Visual/preceptual remediation/compensation, Orthotic/Fit training, DME instructions,  Aquatic Therapy, Dry Needling, Cognitive remediation, Electrical stimulation, Spinal manipulation, Spinal mobilization, Cryotherapy, Moist heat, Manual lymph drainage, Compression bandaging, scar mobilization, Splintting, Taping, Traction, Ultrasound, Parrafin, Contrast bath, Biofeedback, Ionotophoresis 4mg /ml Dexamethasone, Manual therapy, and Re-evaluation  PLAN FOR NEXT SESSION: Progress strengthening especially focus on postural strengthening. Balance; upper and lower extremity strengthening.  F/U on HEP complaince and collar wear time.   12:03 PM, 05/17/22 05/19/22, PTA/CLT [facility] [place] Ph: [phone]

## 2022-05-26 ENCOUNTER — Encounter (HOSPITAL_COMMUNITY): Payer: 59 | Admitting: Occupational Therapy

## 2022-05-31 ENCOUNTER — Encounter (HOSPITAL_COMMUNITY): Payer: 59

## 2022-05-31 ENCOUNTER — Encounter (HOSPITAL_COMMUNITY): Payer: Self-pay

## 2022-05-31 ENCOUNTER — Telehealth (HOSPITAL_COMMUNITY): Payer: Self-pay

## 2022-05-31 NOTE — Therapy (Signed)
Pt arrived 20 minutes late with reports of recent fall onto Lt arm.  Arm bleeding.  Reviewed late check-in and reviewed next apt date and time.  Encouraged to seek ER/urgent care if feels needs more assistance.  Becky Sax, LPTA/CLT; [place] [phone]

## 2022-06-05 ENCOUNTER — Ambulatory Visit (HOSPITAL_COMMUNITY): Payer: 59 | Attending: Neurology

## 2022-06-05 DIAGNOSIS — R262 Difficulty in walking, not elsewhere classified: Secondary | ICD-10-CM | POA: Diagnosis present

## 2022-06-05 DIAGNOSIS — M6281 Muscle weakness (generalized): Secondary | ICD-10-CM | POA: Diagnosis present

## 2022-06-05 DIAGNOSIS — R29898 Other symptoms and signs involving the musculoskeletal system: Secondary | ICD-10-CM | POA: Diagnosis present

## 2022-06-05 DIAGNOSIS — R293 Abnormal posture: Secondary | ICD-10-CM | POA: Insufficient documentation

## 2022-06-05 DIAGNOSIS — M545 Low back pain, unspecified: Secondary | ICD-10-CM | POA: Insufficient documentation

## 2022-06-05 NOTE — Therapy (Signed)
OUTPATIENT PHYSICAL THERAPY DISCHARGE NOTE  PHYSICAL THERAPY DISCHARGE SUMMARY  Visits from Start of Care: 3  Current functional level related to goals / functional outcomes: See below   Remaining deficits: See below   Education / Equipment: See below   Patient agrees to discharge. Patient goals were not met. Patient is being discharged due to lack of progress.   Progress Note Reporting Period 05/08/2022 to 06/05/2022  See note below for Objective Data and Assessment of Progress/Goals.       Patient Name: [name] MRN: [id number] DOB:[birth year], 61 y.o., male Today's Date: 06/05/2022   PCP: Dr. Clide Deutscher REFERRING PROVIDER: Alda Berthold, DO [facility]    PT End of Session - 06/05/22 6244     Visit Number 3    Number of Visits 3    Date for PT Re-Evaluation 05/29/22    Authorization Type [employer]; 30 visits combined for PT/OT/ST; $70 copay    Authorization Time Period auth required after visit 29    Authorization - Visit Number 3    Authorization - Number of Visits 30    Progress Note Due on Visit 3    PT Start Time 6950    PT Stop Time 1115    PT Time Calculation (min) 40 min               Past Medical History:  Diagnosis Date   Clavicle fracture 06/17/5749   Eosinophilic esophagitis 5183   Previously treated with systemic corticosteroid therapy as the patient could not afford Flovent at that time. Patient was referred to the allergist but did not go to the appointment.   GERD (gastroesophageal reflux disease)    Seasonal allergies    Past Surgical History:  Procedure Laterality Date   ESOPHAGOGASTRODUODENOSCOPY  01/31/2007   FPO:[id number] food impaction/ Schatzki's ring, inflamed distal esophageal mucosa   ESOPHAGOGASTRODUODENOSCOPY  02/14/2007   RMR: Somewhat ribbed appearing esophageal mucosa (biopsy-eosinophilic esophagitis), prominent Schatzki ring status post dilation with Maloney dilators.  Pale raised esophageal  nodule status post biopsy (eosinophilic esophagitis). Moderate sized hiatal hernia.    ESOPHAGOGASTRODUODENOSCOPY (EGD) WITH ESOPHAGEAL DILATION N/A 04/25/2013   ZXY:[id number] reflux esophagitis/eosinophilic esophagitis biopsy proven/Hiatal hernia/Antral erosions. 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  He is still trying to work, drive a Publishing copy. He feels he is getting worse. Feels like his arms are weaker.Difficult to understand patient (stutters, gets off task).  Pt comes today carrying his neck collar, admits to not wearing it as he needs to get used to it.   Also seeing OT; has seen ST for eval Pt accompanied by: self  PERTINENT HISTORY:  He was hospitalized at [facility] from 2/25 - 2/27 with confusion and weakness.  He underwent MRI brain and cervical spine which was notable for moderate canal stenosis at C3-4, no  significant abnormalities of the brain. Labs were notable for vitamin B12 and E deficiency. 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 He works on a farm as a Media planner.  He has been at the same place for 42 years. 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 He is also being referred to [facility] neuromuscular clinic for evaluation and management, as Dr. Posey Pronto will be out on medical leave.    PAIN:  Are you having pain? Yes: NPRS scale: 5-6/10 Pain location: low back Pain description: sore Aggravating factors: unknown; at night Relieving factors: rest  PRECAUTIONS: Fall  WEIGHT BEARING RESTRICTIONS No  FALLS: Has patient fallen in last 6 months? Yes. Number of falls 5 or more  LIVING ENVIRONMENT: Lives with: lives alone Lives in: Mobile home Stairs: Yes: External: 5 steps; on right going up, on left going up, and can reach both Has following equipment at home:  neck collar  PLOF: Independent  PATIENT GOALS to be able to drive.  OBJECTIVE:   DIAGNOSTIC FINDINGS: CLINICAL DATA:  Proximal muscle weakness   EXAM: MRI CERVICAL SPINE WITHOUT AND WITH CONTRAST   TECHNIQUE: Multiplanar and multiecho pulse sequences of the cervical spine, to include the craniocervical junction and cervicothoracic junction, were obtained without and with intravenous contrast.   CONTRAST:  57m GADAVIST GADOBUTROL 1 MMOL/ML IV SOLN   COMPARISON:  None.   FINDINGS: Alignment: No significant listhesis.   Vertebrae: Vertebral body heights are maintained. No substantial marrow edema. No suspicious osseous lesion.   Cord: No abnormal signal.  No abnormal intrathecal enhancement.   Posterior Fossa, vertebral arteries, paraspinal tissues: Unremarkable. Intracranial structures evaluated separately.   Disc levels:   C2-C3: Punctate central annular fissure. Facet hypertrophy. No canal or foraminal stenosis.    C3-C4: Disc bulge with endplate osteophytes. Uncovertebral and facet hypertrophy. Thickening of the ligamentum flavum. Moderate canal stenosis. Mild to moderate foraminal stenosis, left greater than right.   C4-C5: Minimal disc bulge. Facet hypertrophy. No canal or foraminal stenosis.   C5-C6: Disc bulge with endplate osteophytes. Uncovertebral and facet hypertrophy. Mild canal stenosis. Moderate foraminal stenosis.   C6-C7: Disc bulge with endplate osteophytes. Small superimposed right paracentral protrusion. Uncovertebral hypertrophy. Minor canal stenosis. Minor foraminal stenosis.   C7-T1:  Facet hypertrophy.  No canal or foraminal stenosis.   IMPRESSION: No abnormal cord signal or enhancement.   Multilevel degenerative changes as detailed above. Canal stenosis is greatest at C3-C4. Foraminal narrowing is greatest at C5-C6.     Electronically Signed   By: PMacy MisM.D.   On: 01/22/2022 17:44    COGNITION: Overall cognitive status: Difficulty to assess   SENSATION: WFL Some numbness reported at night 5th digit  COORDINATION: Impaired; decreased control of the neck especially noted; when he sits down he tried to fling his head  up with momentum and hits his head on the wall behind him.    MUSCLE TONE: patient with significant muscle wasting noted of the periscapular area bilaterally and generally is very thin; concave abdominals in supine; he states he is eating well.    POSTURE: rounded shoulders, forward head, increased thoracic kyphosis, left pelvic obliquity, and flexed trunk     CERVICAL ROM:   Active ROM AROM  eval  Flexion Sits with chin on chest  Extension Unable to extend neck in upright sitting  Right lateral flexion   Left lateral flexion   Right rotation   Left rotation    (Blank rows = not tested)   UPPER EXTREMITY MMT:  MMT Right eval Left eval Right Left  Shoulder flexion Unable to raise in sitting Unable to raise in sitting     Shoulder extension      Shoulder abduction      Shoulder adduction      Shoulder extension      Shoulder internal rotation      Shoulder external rotation      Middle trapezius      Lower trapezius      Elbow flexion 3+ 4 3- 3-  Elbow extension 4 3+ 3+ 3+   Wrist flexion      Wrist extension      Wrist ulnar deviation      Wrist radial deviation      Wrist pronation      Wrist supination      Grip strength       (Blank rows = not tested)    LOWER EXTREMITY MMT:    MMT Right Eval Left Eval Right Left  Hip flexion _0 Hip extension      Hip abduction      Hip adduction      Hip internal rotation      Hip external rotation      Knee flexion      Knee extension _1 Ankle dorsiflexion 4+ 4+ 4+ 4-  Ankle plantarflexion      Ankle inversion      Ankle eversion      (Blank rows = not tested)  BED MOBILITY:  Sit to supine SBA Supine to sit SBA   STAIRS:  Level of Assistance: CGA  Stair Negotiation Technique: Forwards with Single Rail on Right Single Rail on Left Bilateral Rails  Number of Stairs: 8   Height of Stairs: 4 inch and then 8 inch  Comments: head rests on chest; extends lumbar spine in order to see path/feet  GAIT: Gait pattern:  neck flexed to chest; extends low back and narrow BOS Distance walked: 278 ft Assistive device utilized: None Level of assistance: CGA Comments: difficult for patient to see where he is walking due to extreme neck flexion  FUNCTIONAL TESTs:  5 times sit to stand: 36 sec 2 minute walk test: 278 ft with CGA  TODAY'S TREATMENT:  06/05/22 Progress note: 5 x STS 47 sec 2 MWT 246 ft Reclined sitting: Cervical retractions x 10 Scapular retractions x 10 AAROM shoulder flexion x 10 bilaterally (therapist assisted)      05/17/22   Goal review; HEP review   Seated:  maintaining upright posturing    Isometric cervical extension (head press) into pillow/wall behind10X10"    AROM bil hamstring curls 10X    AROM  bil hammer curls 10X    Sit to stands keeping head upright with  min assist from therapist 5X, eccentric control   Supine:  isometric cervical extension (head press) into pillow 10X10"    Bridge 10X, AROM    Scapular retraction (elbows into mat) 10X    SLR 10X each side, AROM    Grasp hands push up, down and back 10X     Eval: Physical therapy evaluation, HEP instruction   PATIENT EDUCATION: Education details: 6/21:  goal review, cervical collar education   evaluation: HEP, plan of care Person educated: Patient Education method: Explanation, Demonstration, and Handouts Education comprehension: verbalized understanding, returned demonstration, and needs further education   HOME EXERCISE PROGRAM: Access Code: [email or web address] URL: [email or web address] Date: 06/05/2022 Prepared by: AP - Rehab  Exercises - Supine Bridge  - 2-3 x daily - 7 x weekly - 1 sets - 10 reps - Supine Scapular Retraction  - 2-3 x daily - 7 x weekly - 1 sets - 10 reps - Supine Single Arm Shoulder External Rotation AROM  - 2-3 x daily - 7 x weekly - 1 sets - 10 reps - Seated Shoulder Flexion Towel Slide at Table Top  - 2-3 x daily - 7 x weekly - 1 sets - 10 reps - Sit to Stand with Armchair  - 2-3 x daily - 7 x weekly - 1 sets - 10 reps - Supine Cervical Retraction with Towel  - 2 x daily - 7 x weekly - 1 sets - 10 reps  GOALS: Goals reviewed with patient? Yes  SHORT TERM GOALS: Target date: 05/22/2022   patient will be independent with initial HEP Baseline: Goal status: IN PROGRESS  2.  Patient will be compliant with wearing a neck collar in order to put his head in a better position to see his walkway to decrease fall risk.  Baseline: not wearing neck collar consistently. Goal status: IN PROGRESS  LONG TERM GOALS: Target date: 05/29/2022  Patient will improve 5 x STS score from 36 sec to 28  sec to demonstrate improved functional mobility and increased lower extremity strength.  Baseline: 36  sec, 06/05/22 47 sec Goal status: IN PROGRESS  2.  Patient will increase 2MWT to 300 ft to demonstrate improved functional mobility Baseline: 278 ft, 7/10 246 ft Goal status: IN PROGRESS  3.  Patient will be able to elevate arms to 45 degrees to improve his ability to grasp the steering wheel to drive.  Baseline: 0; 0 Goal status: IN PROGRESS  4.  Patient will remains free of falls Baseline: fall before last visit Goal status: IN PROGRESS  5.  Patient will be independent in self management strategies to improve quality of life and functional outcomes.  Baseline:  Goal status: IN PROGRESS  ASSESSMENT:  CLINICAL IMPRESSION: Progress note today.  Patient has declined in all tested areas.  He especially presents with muscle wasting of cervical and upper thoracic spine area; increased arm weakness.  This PT is concerned with his decline and discussed with he and his employer/caregiver at length about returning to neurologist. His neurologist is currently on maternity leave so his next appointment with her is August 22nd.  He sees OT tomorrow and PT will discharge today to OT with recommendation that patient follow up with neurologist due to decline.   OBJECTIVE IMPAIRMENTS Abnormal gait, decreased activity tolerance, decreased balance, decreased cognition, decreased coordination, decreased endurance, decreased knowledge of condition, decreased knowledge of use of DME, decreased mobility, difficulty walking, decreased ROM, decreased strength, decreased safety awareness, hypomobility, impaired perceived functional ability, impaired flexibility, impaired  tone, impaired UE functional use, improper body mechanics, postural dysfunction, and pain.   ACTIVITY LIMITATIONS carrying, lifting, bending, sitting, standing, squatting, sleeping, stairs, transfers, bed mobility, bathing, toileting, dressing, self feeding, reach over head, hygiene/grooming, locomotion level, and caring for  others  PARTICIPATION LIMITATIONS: meal prep, cleaning, laundry, medication management, driving, shopping, community activity, occupation, and yard work   [employer] POTENTIAL: Good  CLINICAL DECISION MAKING: Evolving/moderate complexity  EVALUATION COMPLEXITY: Moderate  PLAN: PT FREQUENCY: 1x/week  PT DURATION: 3 weeks  PLANNED INTERVENTIONS: Therapeutic exercises, Therapeutic activity, Neuromuscular re-education, Balance training, Gait training, Patient/Family education, Joint manipulation, Joint mobilization, Stair training, Vestibular training, Canalith repositioning, Visual/preceptual remediation/compensation, Orthotic/Fit training, DME instructions, Aquatic Therapy, Dry Needling, Cognitive remediation, Electrical stimulation, Spinal manipulation, Spinal mobilization, Cryotherapy, Moist heat, Manual lymph drainage, Compression bandaging, scar mobilization, Splintting, Taping, Traction, Ultrasound, Parrafin, Contrast bath, Biofeedback, Ionotophoresis 71m/ml Dexamethasone, Manual therapy, and Re-evaluation  PLAN FOR NEXT SESSION:  discharge.  1:26 PM, 06/05/22 Carlei Huang Small Clea Dubach MPT [facility] [place] [phone]

## 2022-06-06 ENCOUNTER — Encounter (HOSPITAL_COMMUNITY): Payer: Self-pay | Admitting: Occupational Therapy

## 2022-06-06 ENCOUNTER — Ambulatory Visit (HOSPITAL_COMMUNITY): Payer: 59 | Admitting: Occupational Therapy

## 2022-06-06 DIAGNOSIS — M6281 Muscle weakness (generalized): Secondary | ICD-10-CM

## 2022-06-06 DIAGNOSIS — R29898 Other symptoms and signs involving the musculoskeletal system: Secondary | ICD-10-CM

## 2022-06-06 DIAGNOSIS — R293 Abnormal posture: Secondary | ICD-10-CM | POA: Diagnosis not present

## 2022-06-06 NOTE — Therapy (Signed)
OUTPATIENT OCCUPATIONAL THERAPY ORTHO TREATMENT  Patient Name: [name] MRN: [id number] DOB:Apr 02, 1961, 61 y.o., male Today's Date: 06/06/2022  PCP: Dr. Lia Hopping REFERRING PROVIDER: Dr. Nita Sickle       Past Medical History:  Diagnosis Date   Clavicle fracture 07/27/2015   Eosinophilic esophagitis 2008   Previously treated with systemic corticosteroid therapy as the patient could not afford Flovent at that time. Patient was referred to the allergist but did not go to the appointment.   GERD (gastroesophageal reflux disease)    Seasonal allergies    Past Surgical History:  Procedure Laterality Date   ESOPHAGOGASTRODUODENOSCOPY  01/31/2007   IWL:[id number] food impaction/ Schatzki's ring, inflamed distal esophageal mucosa   ESOPHAGOGASTRODUODENOSCOPY  02/14/2007   RMR: Somewhat ribbed appearing esophageal mucosa (biopsy-eosinophilic esophagitis), prominent Schatzki ring status post dilation with Maloney dilators.  Pale raised esophageal nodule status post biopsy (eosinophilic esophagitis). Moderate sized hiatal hernia.    ESOPHAGOGASTRODUODENOSCOPY (EGD) WITH ESOPHAGEAL DILATION N/A 04/25/2013   DEY:CXKGYJE reflux esophagitis/eosinophilic esophagitis biopsy proven/Hiatal hernia/Antral erosions. conservative esophageal dilation   Patient Active Problem List   Diagnosis Date Noted   Proximal weakness of limb 01/21/2022   GERD (gastroesophageal reflux disease) 04/10/2013   Eosinophilic esophagitis 04/10/2013   Esophageal dysphagia 04/10/2013    ONSET DATE: 2-3 years ago (~2020)  REFERRING DIAG: R29.898 (ICD-10-CM) - Bilateral arm weakness  THERAPY DIAG:  Other symptoms and signs involving the musculoskeletal system  Muscle weakness (generalized)  SUBJECTIVE:   SUBJECTIVE STATEMENT: S: I am sore, especially in my RUE where I fell at the barn this week. Pt accompanied by: self  PERTINENT HISTORY: [name] is a 61 yo male who was referred by Dr. Nita Sickle  for an occupational therapy evaluation for bilateral arm weakness. For the past 2-3 years, patient has developed bilateral arm weakness, specifically being unable to raise his arms at the shoulder and he was walking more hunched over.  Weakness has been progressive.  He also has weakness with trying to keep his head upright.  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  Over the past 6 months, his weakness progressed and he was unable to raise his arms to drive. He was hospitalized at [facility] from 2/25 - 2/27 with confusion and weakness.  He underwent MRI brain and cervical spine which was notable for moderate canal stenosis at C3-4, no significant abnormalities of the brain. Labs were notable for vitamin B12 and E deficiency. 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 He is also being referred to [facility] neuromuscular clinic for evaluation and management, as Dr. Allena Katz will be out on medical leave.  PRECAUTIONS: Fall  WEIGHT BEARING RESTRICTIONS No  PAIN:  Are you having pain? Yes: NPRS scale: 5/10 Pain location: bilateral forearms Pain description: sore Aggravating factors: tense from max effort of use Relieving factors: unsure   OBJECTIVE:     FUNCTIONAL OUTCOME MEASURES: Quick Dash: 56.82  UE ROM     A/ROM: pt is unable to lift shoulders from sides when seated. Elbow, wrist, forearm, hand ROM is WNL.  Passive ROM Right 04/12/2022 Right 05/11/22 Left 04/12/2022 Left  05/11/22 Right 06/06/22 Left 06/06/22  Shoulder flexion 170 170 170 170 170 170  Shoulder abduction 170  170 170 170 170 170  Shoulder internal rotation 90 90 90 90 90 90  Shoulder external rotation 70 45 70 50    (Blank rows = not tested)   UE MMT:     MMT  Right 04/12/2022 Right  05/11/22 Left 04/12/2022 Left  05/11/22 Right 06/06/22 Left 06/06/22  Shoulder flexion 2-/5 2-/5 2-/5 2-/5 2-/5 2-/5  Shoulder abduction 2-/5 2-/5 2-/5 2-/5 2-/5 2-/5  Shoulder internal rotation 4/5 4/5 4/5 4/5 4/5 4/5  Shoulder external rotation 3-/5 3-/5 3-/5 3-/5 3-/5 3-/5  Elbow flexion 4+/5 4+/5 4+/5 4+/5 4-/5 4/5  Elbow extension 4/5 4/5 4/5 4/5 4-/5 4-/5  Wrist flexion 5/5 5/5 5/5 5/5 5/5 4+/5  Wrist extension 5/5 5/5 5/5 5/5 5/5 5/5  Wrist ulnar deviation 5/5 5/5 5/5 5/5 5/5 4/5  Wrist radial deviation 5/5 5/5 5/5 5/5 5/5 5/5  Wrist pronation 4+/5 4+/5 4+/5 4+/5 3+/5 3+/5  Wrist supination 4/5 4/5 4/5 4/5 4/5 4-/5  (Blank rows = not tested)  HAND FUNCTION: Grip strength: Right: 50 (55 previous) lbs; Left: 56 (50 previous) lbs, Lateral pinch: Right: 7 (6 previous) lbs, Left: 6 (9 previous) lbs, and 3 point pinch: Right: 7 (9 previous) lbs, Left: 4 (9 previous) lbs  7/11: Grip strength: Right: 45 (50 previous) lbs; Left: 45 (56 previous) lbs, Lateral pinch: Right: 8 (7 previous) lbs, Left: 5 (6 previous) lbs, and 3 point pinch: Right: 6 (7 previous) lbs, Left: 8 (4 previous) lbs  COORDINATION: Appears WFL-pt unable to complete formal testing due to bilateral shoulder weakness limiting ability to reach items on the table at waist level.  SENSATION: Pt reports tingling along left ulnar hand/wrist  COGNITION: Overall cognitive status: Difficulty to assess Areas of impairment: Areas of impairment: Attention, Memory, Following commands, Safety/judgement, Awareness, and Problem solving  Attention: Deficits difficulty with maintaining conversation and remaining on task Memory: Deficits difficulty with timeline and sequencing    TODAY'S TREATMENT: 06/06/22   05/15/22 -Isometric Strengthening: Pt in supine; 5x5" for bilateral shoulder flexion, shoulder extension, abduction -AA/ROM: Pt in supine, 2x5 reps protraction with dowel. Therapist providing min assist  with protraction, mod assist for slight hold at end range, and mod assist with descent. Shoulder flexion 2x5 repetitions, therapist providing mod assistance with both ascent and descent. Max verbal cues for success. -AA/ROM Row: using two dowel rods, therapist facilitating forward protraction and scapular row. OT providing mod assist throughout task for mobility and positioning. 1x10 reps. -A/ROM: Pt in supine, 2x5 reps elbow flexion/extension with therapist providing constant verbal cues for breath work. -Functional Reaching: Boomwacker, pt reaching into various planes to hit therapists boomwacker with his own. Max effort, max difficulty reaching past 25%  range. 10-15 reps with each UE, 5 reps with BUE.   05/11/22 -Isometric Strengthening: Pt in supine; 3x3" for flexion, internal rotation, external rotation; max effort and max verbal and tactile cuing for success. Rest breaks needed throughout, increased time for working through the rest of the exercises.  -Trialed AA/ROM in supine using dowel rod-pt able to complete 5 reps of flexion and protraction with min/mod assist for lifting bar -Discussed ADLs with pt who reports he is doing what he can. Educated on assistance available such as a home health aide.  -Donned soft cervical collar-posture and head positioning somewhat improved. Educated pt on benefits of wearing soft collar for improved posture, decreased back pain, increased neck stability. Pt reports he has it but does not wear it very often.  PATIENT EDUCATION: Education details: educated importance of breathing during tasks Person educated: Patient Education method: Explanation Education comprehension: verbalized understanding   HOME EXERCISE PROGRAM: Provide at next appt-if appropriate  GOALS: Goals reviewed with patient? Yes  SHORT TERM GOALS: Target date: 06/09/2022   Pt will be provided on HEP to improve ability to use BUE during ADLs.   Goal status: ONGOING  2.  Pt  will increase A/ROM of bilateral shoulders to 50% to improve ability to complete tasks at waist height.  Goal status: ONGOING  3.  Pt will increase bilateral shoulder strength to 3/5 to improve ability to reach for items at waist to chest height during functional tasks.   Goal status: ONGOING  4.  Pt will be educated on AE and DME available to improve independence in ADL completion.   Goal status: ONGOING      ASSESSMENT:  CLINICAL IMPRESSION: Completed isometric strengthening with pt continuing to require max verbal and tactile cues for positioning, form, and breathing throughout. Noted dominant RUE slightly stronger than LUE. Completed AA/ROM protraction and flexion with dowel, requiring therapist assistance to complete movement and noted increased fatigue with final repetitions. Provided education on coordinating breath work with ROM exercises with some increased fluidity of movement noted. Pt utilized moderate compensation patterns to complete reaching activity, increased success when using BUE. OT notes ability to isolate specific muscles for movement with max cuing and facilitation, including breath work. HEP not appropriate at this time due to difficulty with form and technique.       PLAN: OT FREQUENCY: 2x/week  OT DURATION: 4 weeks  PLANNED INTERVENTIONS: self care/ADL training, therapeutic exercise, therapeutic activity, neuromuscular re-education, manual therapy, passive range of motion, electrical stimulation, patient/family education, and DME and/or AE instructions  RECOMMENDED OTHER SERVICES: Nerve conduction study, PT, Speech services  CONSULTED AND AGREED WITH PLAN OF CARE: Patient  PLAN FOR NEXT SESSION: Continue with breath work, AA/ROM, functional reaching   [employer], OTR/L  [phone] 06/06/2022, 11:11 AM

## 2022-06-08 ENCOUNTER — Telehealth: Payer: Self-pay | Admitting: Neurology

## 2022-06-08 NOTE — Telephone Encounter (Signed)
Called and spoke with patient about scheduling a EMG for the left upper and lower ext. He states that he will have the person that takes care of his appointments to take care of the scheduling and will have her call back to our office.   Patient needs a EMG of the left upper and lower ext

## 2022-06-08 NOTE — Telephone Encounter (Signed)
Called and spoke to [name] and she informed me that Mr. Pasternak has deteriorated so bad. She asked if we have gotten his report from there therapist and I told her I did see it come through and I had sent it to Dr. Allena Katz. I asked [name] if she has heard from [facility] as we have sent in the referral at patients last visit. [name] states she is may have gotten a call but she is not sure. I informed [name] that I am going to follow up on this referral for Mr Dissinger and get back to her. [name] states that she doesn't think Mr. Barca will survive until October for his EMG.  Informed [name] that I will send her message back to the covering physicians and alert them about therapy results in patients chart, follow up on [name] referral, then get back to her once I hear back. She is aware Dr. Allena Katz will be back on August 1st.

## 2022-06-08 NOTE — Telephone Encounter (Signed)
Pt's friend [name] called in after getting a call from [facility] to schedule an EMG. She was upset due to thinking he already had one scheduled for August. She says the one we scheduled for 08/29/22 is too late. She says it's getting to be life or death for the patient. She is very worried.

## 2022-06-09 ENCOUNTER — Other Ambulatory Visit: Payer: Self-pay | Admitting: Internal Medicine

## 2022-06-09 ENCOUNTER — Other Ambulatory Visit (HOSPITAL_COMMUNITY): Payer: Self-pay | Admitting: Internal Medicine

## 2022-06-09 DIAGNOSIS — G458 Other transient cerebral ischemic attacks and related syndromes: Secondary | ICD-10-CM

## 2022-06-09 NOTE — Telephone Encounter (Signed)
Called [name] and left a message for a call back.

## 2022-06-09 NOTE — Telephone Encounter (Signed)
Patient returned Mahina's call. 

## 2022-06-09 NOTE — Telephone Encounter (Signed)
Called [name] and explained to her that we can place [name] on a waitlist and if anything opens up sooner for an EMG he will be contacted. I explained to [name] that if she felt [name] life is in danger to please take him the emergency room. [name] verbalized understanding and thanked me for the call.   Sending call upfront to have patient placed on waitlist.

## 2022-06-09 NOTE — Telephone Encounter (Signed)
Called [facility] and they did receive the referral in April. They stated that they were never able to get a hold of the patient and patient never returned call to schedule with them.

## 2022-06-09 NOTE — Telephone Encounter (Signed)
[name] returned call and was informed that I had heard from Dr. Everlena Cooper who is covering for Dr. Allena Katz. Informed [name] that Dr. Everlena Cooper stated Unfortunately at this time, we do not have the capabilities in our office to perform further workup.  He was referred to [facility] over 3 months ago for further workup and management.  If he has been significantly deteriorating, he needs to go to the ED, preferably [facility] since that is where Dr. Allena Katz wanted him to be seen and treated.    [name] stated that she is not taking him to the emergency room and she does not feel comfortable taking him to the emergency room and added [place] the farm. She stated in February she has taken him to [facility] emergency room where they performed MRI's and nothing showed up. [name] stated that he has been deteriorating as far as his muscle weakness goes. His speech, swallowing, breathing has all been fine and he has been eating just fine. Patient is still currently working on the farm every day.  [name] wants the EMG moved up sooner if possible because she feels this test will tell them what is going on then something can be done for him. [name] also informed me that patient was seen by internal medicine yesterday and they're going to perform a CT on patient.

## 2022-06-09 NOTE — Telephone Encounter (Signed)
Called [name] and left a message for a call back. 

## 2022-06-28 ENCOUNTER — Ambulatory Visit (HOSPITAL_COMMUNITY): Payer: 59

## 2022-06-28 ENCOUNTER — Encounter (HOSPITAL_COMMUNITY): Payer: Self-pay

## 2022-06-29 ENCOUNTER — Ambulatory Visit: Payer: 59 | Admitting: Neurology

## 2022-06-29 ENCOUNTER — Other Ambulatory Visit: Payer: Self-pay

## 2022-06-29 DIAGNOSIS — G1221 Amyotrophic lateral sclerosis: Secondary | ICD-10-CM

## 2022-06-29 DIAGNOSIS — R202 Paresthesia of skin: Secondary | ICD-10-CM | POA: Diagnosis not present

## 2022-06-29 NOTE — Procedures (Signed)
[facility]  [address] [place], [address]  [place], [place] [address] Tel: [phone] Fax:  [phone] Test Date:  06/29/2022  Patient: [name] DOB: Oct 18, 1961 Physician: Nita Sickle, DO  Sex: Male Height: 6\' 1"  Ref Phys: , DO  ID#: Nita Sickle   Technician:    Patient Complaints: This is a 61 year old man referred for evaluation head drop, speech changes, severe arm weakness.  NCV & EMG Findings: Extensive electrodiagnostic testing of the left upper extremity, left lower extremity, mid thoracic paraspinal muscles (T7 and T11 levels), and bulbar muscles shows:  All sensory responses including the left median, ulnar, radial, sural, and superficial peroneal nerves are within normal limits. Left median and ulnar (FDI) motor responses show reduced amplitude (L4.5, 4.2 mV).  Left ulnar (ADM), peroneal, and tibial motor responses are within normal limits. Left tibial H reflex study is within normal limits. In the left upper extremity, active on chronic motor axonal loss changes are seen affecting all the tested muscles involving the C5-T1 myotomes, worse at C5, C6, and C7.  Despite maximal activation, no motor unit recruitment is seen in the infraspinatus muscle with there is diffuse active denervation. In the left lower extremity, active on chronic motor axonal loss changes are seen in all the tested muscles involving the L3-S1 myotomes. Fibrillation potentials are present in the thoracic paraspinal muscles at the T7 and T11 level. Chronic motor axonal loss changes are seen in the mentalis and hyoglossus muscles, with fibrillation seen in the hyoglossus muscle. Fasciculation potentials are seen in 9 of 19 tested muscles.   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   ___________________________ 62, DO    Nerve Conduction Studies Anti Sensory Summary Table   Stim Site NR Peak (ms) Norm Peak (ms) P-T Amp (V) Norm P-T Amp  Left Median Anti Sensory (2nd Digit)  32C  Wrist    3.7 <3.8 35.5 >10  Left Radial Anti Sensory (Base 1st Digit)  32C  Wrist    2.1 <2.8 20.3 >10  Left Sup Peroneal Anti Sensory (Ant Lat Mall)  32C  12 cm    2.6 <4.6 9.8 >3  Left Sural Anti Sensory (Lat Mall)  32C  Calf    3.5 <4.6 6.2 >3  Left Ulnar Anti Sensory (5th Digit)  32C  Wrist    3.7 <3.2 23.9 >5   Motor Summary Table   Stim Site NR Onset (ms) Norm Onset (ms) O-P Amp (mV) Norm O-P Amp Site1 Site2 Delta-0 (ms) Dist (cm) Vel (m/s) Norm Vel (m/s)  Left Median Motor (Abd Poll Brev)  32C  Wrist    3.8 <4.0 4.5 >5 Elbow Wrist 5.6 29.0 52 >50  Elbow    9.4  3.5         Left Peroneal Motor (Ext Dig Brev)  32C  Ankle    4.8 <6.0 2.8 >2.5 B Fib Ankle 7.2 34.0 47 >40  B Fib    12.0  2.4  Poplt B Fib 2.3 10.0 43 >40  Poplt    14.3  2.3         Left Peroneal TA Motor (Tib Ant)  32C  Fib Head    3.4 <4.5 3.6 >3 Poplit Fib Head 1.6 10.0 63 >40  Poplit    5.0  3.4  Left Tibial Motor (Abd Hall Brev)  32C  Ankle    5.5 <6.0 8.4 >4 Knee Ankle 9.4 43.0 46 >40  Knee    14.9  6.4         Left Ulnar Motor (Abd Dig Minimi)  32C  Wrist    3.2 <3.1 8.8 >7 B Elbow Wrist 4.5 23.0 51 >50  B Elbow    7.7  7.8  A Elbow B Elbow 2.1 10.0 48 >50  A Elbow    9.8  7.3         Left Ulnar (FDI) Motor (1st DI)  32C  Wrist    4.5 <4.5 4.2 >7 B Elbow Wrist 4.3 23.0 53 >50  B Elbow    8.8  3.7  A Elbow B Elbow 2.3 10.0 43 >50  A Elbow    11.1  3.6          H Reflex Studies   NR H-Lat (ms) Lat Norm (ms) L-R H-Lat (ms)  Left Tibial (Gastroc)  32C     34.97 <35    EMG   Side Muscle Ins Act Fibs Psw Fasc Number Recrt Dur Dur. Amp Amp. Poly Poly. Comment  Left 1stDorInt Nml 1+ Nml 1+ 3- Rapid All 1+ All 1+ All 1+ ATR  Left ABD Dig Min Nml 1+  Nml 1+ 3- Rapid All 1+ All 1+ All 1+ N/A  Left Ext Indicis Nml 1+ Nml 1+ 1- Rapid Some 1+ Some 1+ Some 1+ N/A  Left PronatorTeres Nml 2+ Nml 1+ 2- Rapid Many 1+ Many 1+ Many 1+ N/A  Left Biceps Nml 2+ Nml 1+ 3- Rapid All 1+ All 1+ All 1+ ATR  Left Abd Poll Brev Nml 1+ Nml Nml 1- Rapid Some 1+ Some 1+ Some 1+ ATR  Left Triceps Nml 2+ Nml 1+ SMU Rapid All 1+ All 1+ All 1+ ATR  Left Deltoid Nml 2+ Nml 1+ SMU Rapid All 1+ All 1+ All 1+ ATR  Left Infraspinatus Nml 3+ Nml Nml NE None - - - - - - ATR  Left Cervical Parasp Low Nml 2+ Nml Nml NE - - - - - - - N/A  Left T7 Parasp Nml 2+ Nml Nml NE - - - - - - - N/A  Left T11 Parasp Nml 1+ Nml Nml NE - - - - - - - N/A  Left Lumbo Parasp Low Nml 1+ Nml Nml NE - - - - - - - N/A  Left GluteusMed Nml 2+ Nml 1+ 3- Rapid All 1+ All 1+ All 1+ N/A  Left RectFemoris Nml 1+ Nml 1+ 2- Rapid All 1+ All 1+ All 1+ N/A  Left AntTibialis Nml 1+ Nml Nml 2- Rapid Many 1+ Many 1+ Many 1+ N/A  Left Gastroc Nml 1+ Nml Nml 2- Rapid Few 1+ Few 1+ Few 1+ N/A  Left Mentalis Nml Nml Nml Nml 2- Rapid Some 1+ Some 1+ Some 1+ N/A  Left Hyoglossus Nml 1+ Nml Nml 2- Rapid Many 1+ Many 1+ Many 1+ N/A      Waveforms:

## 2022-06-30 ENCOUNTER — Other Ambulatory Visit: Payer: Self-pay | Admitting: Internal Medicine

## 2022-07-07 ENCOUNTER — Ambulatory Visit: Payer: 59 | Admitting: Neurology

## 2022-07-07 ENCOUNTER — Encounter: Payer: Self-pay | Admitting: Neurology

## 2022-07-07 VITALS — BP 138/88 | HR 51 | Ht 73.0 in | Wt 122.0 lb

## 2022-07-07 DIAGNOSIS — G1221 Amyotrophic lateral sclerosis: Secondary | ICD-10-CM

## 2022-07-07 NOTE — Patient Instructions (Signed)
We will refer you to palliative care  Return to clinic in 3 month

## 2022-07-07 NOTE — Progress Notes (Unsigned)
Follow-up Visit   Date: 07/07/22   [name] MRN: [id number] DOB: [birth year]   Interim History: [name] is a 61 y.o. right-handed Caucasian male with *** returning to the clinic for follow-up of ***.  The patient was accompanied to the clinic by *** who also provides collateral information.    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  For the past 2-3 years, patient has developed bilateral arm weakness, specifically being unable to raise his arms at the shoulder and he was walking more hunched over.  He has good strength in the hands and forearms.  Weakness has been progressive.  He also has weakness with trying to keep his head upright.  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  Over the past 6 months, his weakness progressed and he was unable to raise his arms to drive.  He has been urged by [name] to go to seek medical care over the years, but he has been resistant.   Also over the past 2-3 years, he has been having speech changes, stuttering, and difficulty with speech content/word-finding.  His friend says that since doing speech therapy, his speech has improved and he is able to get words out easier and stuttering is less.    He was hospitalized at [facility] from 2/25 - 2/27 with confusion and weakness.  He underwent MRI brain and cervical spine which was notable for moderate canal stenosis at C3-4, no significant abnormalities of the brain. Labs were notable for vitamin B12 and E deficiency. 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   Remaining labs including CBC, CK, aldolase, TSH, copper, folate, vitamin B1, HbA1c, HIV, ANA, sdDNA, ad heavy metals were normal.  He is getting home OT, PT, and ST.  He endorses significant weight loss over the last 6-8 months.  He denies difficulty with  swallowing, but [name] says that his diet is very poor and he does not eat any fruits/vegetables.  He usually eats corn dog and coke every day.  Rarely drinks water.   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    UPDATE ***:    Medications:  Current Outpatient Medications on File Prior to Visit  Medication Sig Dispense Refill   dorzolamide-timolol (COSOPT) 22.3-6.8 MG/ML ophthalmic solution Place 1 drop into the right eye 2 (two) times daily.     hydrALAZINE (APRESOLINE) 10 MG tablet Take 10 mg by mouth 2 (two) times daily.     latanoprost (XALATAN) 0.005 % ophthalmic solution Place 1 drop into the right eye at bedtime.     methocarbamol (ROBAXIN) 500 MG tablet Take 500 mg by mouth at bedtime.     pantoprazole (PROTONIX) 40 MG tablet Take 1 tablet (40 mg total) by mouth daily. 30 tablet 0   vitamin B-12 (CYANOCOBALAMIN) 1000 MCG tablet Take 1 tablet (1,000 mcg total) by mouth daily. 30 tablet 6   methylPREDNISolone (MEDROL DOSEPAK) 4 MG TBPK tablet Take by mouth as directed. (Patient not taking: Reported on 07/07/2022)     thiamine 100 MG tablet Take 1 tablet (100 mg total) by mouth daily. (Patient not taking: Reported on 07/07/2022) 30 tablet 1   No current facility-administered medications on file prior to visit.    Allergies:  No Known Allergies  Vital Signs:  BP 138/88   Pulse (!) 51   Ht 6\' 1"  (1.854 m)   Wt 122 lb (55.3 kg)   SpO2 99%   BMI 16.10 kg/m    General Medical Exam:   General:  Well appearing, comfortable  Eyes/ENT: see cranial nerve examination.   Neck:  No carotid bruits. Respiratory:  Clear to auscultation, good air entry bilaterally.   Cardiac:  Regular rate and rhythm, no murmur.   Ext:  No edema ***  Neurological Exam: MENTAL STATUS including orientation to time, place, person, recent and remote memory, attention span and concentration, language, and fund of knowledge is ***normal.  Speech is not dysarthric.  CRANIAL  NERVES:  No visual field defects.  Pupils equal round and reactive to light.  Normal conjugate, extra-ocular eye movements in all directions of gaze.  No ptosis ***.  Face is symmetric. Palate elevates symmetrically.  Tongue is midline.  MOTOR:  Severe shoulder girdle muscle atrophy, diffuse active fasciculations.  Upper Extremity:  Right  Left  Deltoid  1/5   1/5   Biceps  3/5   3/5   Triceps  3/5   3/5   Infraspinatus 1/5  1/5  Medial pectoralis 1/5  1/5  Wrist extensors  4/5   4/5   Wrist flexors  4/5   4/5   Finger extensors  4/5   4/5   Finger flexors  4/5   4/5   Dorsal interossei  4/5   4/5   Abductor pollicis  4/5   4/5   Tone (Ashworth scale)  0  0   Lower Extremity:  Right  Left  Hip flexors  4/5   4/5   Knee flexors  4/5   4/5   Knee extensors  4/5   4/5   Dorsiflexors  4/5   4/5   Plantarflexors  4/5   4/5   Toe extensors  4/5   4/5   Toe flexors  4/5   4/5   Tone (Ashworth scale)  0  0    *** MSRs:  Reflexes are 2+/4 throughout ***.  SENSORY:  Intact to vibration throughout ***.  COORDINATION/GAIT:  Normal finger-to- nose-finger.  Intact rapid alternating movements bilaterally.  Gait narrow based and stable.   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   MRI brain 01/22/2022: No acute or reversible finding. Mild chronic small vessel ischemia.   MRI cervical spine 01/22/2022; No abnormal cord signal or enhancement. Multilevel degenerative changes as detailed above. Canal stenosis is greatest at C3-C4. Foraminal narrowing is greatest at C5-C6.    IMPRESSION/PLAN: Amyotroph ***  PLAN/RECOMMENDATIONS:  ***  Return to clinic in ***.   Total time spent:  ***  Thank you for allowing me to participate  in patient's care.  If I can answer any additional questions, I would be pleased to do so.    Sincerely,    Juaquina Machnik K. 01/24/2022, DO

## 2022-07-12 ENCOUNTER — Other Ambulatory Visit: Payer: Self-pay

## 2022-07-12 DIAGNOSIS — G1221 Amyotrophic lateral sclerosis: Secondary | ICD-10-CM

## 2022-07-12 NOTE — Progress Notes (Signed)
Palliative referral sent to [facility].

## 2022-07-18 ENCOUNTER — Encounter: Payer: 59 | Admitting: Neurology

## 2022-07-25 ENCOUNTER — Encounter: Payer: 59 | Admitting: Neurology

## 2022-08-29 ENCOUNTER — Encounter: Payer: 59 | Admitting: Neurology

## 2022-09-27 ENCOUNTER — Telehealth: Payer: Self-pay | Admitting: Neurology

## 2022-09-27 NOTE — Telephone Encounter (Signed)
[name] will bringing by an [facility] form for [facility], patient will be transferring there as soon as possible.

## 2022-09-28 NOTE — Telephone Encounter (Signed)
[name] dropped off the [facility] form. Placed in Dr. Serita Grit inbox.

## 2022-09-28 NOTE — Telephone Encounter (Signed)
I will complete it for him.

## 2022-10-03 NOTE — Telephone Encounter (Signed)
Called and spoke to [name], and she has informed me that patient has stopped all medication and is only using huis eye drops Xalatan. [name] is aware that the [facility] form is ready for pick up and stated that she will be here tomorrow 10/04/22 to pick up forms.   Also, [name] wanted to know if patient needs to keep his appt scheduled for next week. Asked Dr. Posey Pronto and she stated that patient does not need to keep his f/u at this time.

## 2022-10-03 NOTE — Telephone Encounter (Signed)
Called Tammy and informed her that [name] will be picking up FL2 form tomorrow. Tammy thanked [place] for the call.

## 2022-10-03 NOTE — Telephone Encounter (Signed)
Tammy - Director [place] called in stating they are needing to get the pt admitted ASAP and would like to see status of the FL2 form. She says he has really gone down hill. She would like a call back. They can have [name] come and pick it up.

## 2022-10-04 ENCOUNTER — Telehealth: Payer: Self-pay

## 2022-10-04 NOTE — Telephone Encounter (Signed)
Spoke to a Engineer, civil (consulting) at [facility] and patient was just admitted today. On the FL2 form the eye drops COSOPT dorzolamide-timolol was not added on the FL2 form and patient brought the medication in. They are needing a Rx to be faxed over for the eye drops to [phone] so that they can give patient the eye drops.   I informed the nurse that I will let Dr. Allena Katz know what is needed and if it is ok with her we will send over a Rx so that it can be added to his FL2 form and given to patient.  Fax to [facility]: [phone] Fax for their pharmacy [facility]: [phone]

## 2022-10-05 ENCOUNTER — Other Ambulatory Visit: Payer: Self-pay

## 2022-10-05 MED ORDER — DORZOLAMIDE HCL-TIMOLOL MAL 2-0.5 % OP SOLN: 1.0000 [drp] | Freq: Two times a day (BID) | OPHTHALMIC | 3 refills | Status: DC

## 2022-10-05 NOTE — Telephone Encounter (Signed)
Rx sent to their [facility] electronically. If they need printed Rx, please let me know.

## 2022-10-11 ENCOUNTER — Ambulatory Visit: Payer: 59 | Admitting: Neurology

## 2022-11-21 ENCOUNTER — Other Ambulatory Visit: Payer: Self-pay | Admitting: Neurology

## 2022-11-22 ENCOUNTER — Other Ambulatory Visit: Payer: Self-pay | Admitting: Neurology

## 2022-12-06 ENCOUNTER — Telehealth: Payer: Self-pay

## 2022-12-06 NOTE — Telephone Encounter (Signed)
Received ppw and placed on Dr. Serita Grit desk to be completed.

## 2022-12-06 NOTE — Telephone Encounter (Signed)
Amber from [place] dropped off physicians order forms, 3 pages. Did express that it is urgent that they get them back quickly but is aware of our process time of 7-10 days. Once completed can call the number and we can notify anyone that papers are ready for pick up.  Placed in providers folder.

## 2022-12-07 NOTE — Telephone Encounter (Signed)
Called [facility] and left a message with Baker Janus to let Amber know that patients ppw is ready to be picked up.

## 2022-12-21 ENCOUNTER — Telehealth: Payer: Self-pay | Admitting: Neurology

## 2022-12-21 NOTE — Telephone Encounter (Signed)
Left message with after hour service on 12-21-22 @12 :56 pm   Caller states that patient may have a sty on top of the rt eyelid  and also C/O constipation   Please call

## 2022-12-22 MED ORDER — NONFORMULARY OR COMPOUNDED ITEM: 0 refills | Status: DC

## 2022-12-22 NOTE — Telephone Encounter (Signed)
OK to give miralax 17g daily for constipation.  Warm compress twice daily for stye.  If no improvement, they will need to contact his PCP for recommendations.  Also, he is very appropriate for palliative care, please place a referral.  Thank you.

## 2022-12-22 NOTE — Telephone Encounter (Signed)
Called and spoke to [facility] and she stated that patient is not in palliative care and they do not have an attending there to handle patients issues. She stated the residents there all have their own PCP's to handle patients care. I informed Eyvonne Mechanic I will let Dr. Posey Pronto know and give her a call back.

## 2022-12-22 NOTE — Telephone Encounter (Signed)
Spoke to a Nurse (forgot what her name was) and she has informed me that we will need to send over a RX order for patients Miralax PRN and for Stye care.   Also asked the Nurse about any suggestions for palliative care because per Dr. Posey Pronto patient would benefit from this. The Nurse was extremely rude and said she doesn't think he needs it and that she would like to speak to his family. I informed the Nurse that I could call [name] myself and discuss that with her. The nurse again stated that she would like to discuss it with [name] due to it being her bldg. I told her she was more than welcome to call [name] and discuss Palliative care and we would do it as well to see if it is something she wants to proceed with. I reminded the Nurse that this was an actual doctor recommendation and not a nurses recommendation.    Order printed and faxed to [phone]

## 2023-01-09 ENCOUNTER — Other Ambulatory Visit: Payer: Self-pay

## 2023-01-09 MED ORDER — NONFORMULARY OR COMPOUNDED ITEM: 0 refills | Status: DC

## 2023-03-20 ENCOUNTER — Encounter (HOSPITAL_COMMUNITY): Payer: Self-pay | Admitting: Emergency Medicine

## 2023-03-20 ENCOUNTER — Emergency Department (HOSPITAL_COMMUNITY): Payer: Medicare Other

## 2023-03-20 ENCOUNTER — Other Ambulatory Visit: Payer: Self-pay

## 2023-03-20 ENCOUNTER — Emergency Department (HOSPITAL_COMMUNITY)
Admission: EM | Admit: 2023-03-20 | Discharge: 2023-03-20 | Disposition: A | Discharge status: Nursing Home (Includes ICF) | Payer: Medicare Other | Attending: Emergency Medicine | Admitting: Emergency Medicine

## 2023-03-20 DIAGNOSIS — W06XXXA Fall from bed, initial encounter: Secondary | ICD-10-CM | POA: Diagnosis not present

## 2023-03-20 DIAGNOSIS — Z23 Encounter for immunization: Secondary | ICD-10-CM | POA: Insufficient documentation

## 2023-03-20 DIAGNOSIS — S0101XA Laceration without foreign body of scalp, initial encounter: Secondary | ICD-10-CM | POA: Diagnosis not present

## 2023-03-20 DIAGNOSIS — W19XXXA Unspecified fall, initial encounter: Secondary | ICD-10-CM

## 2023-03-20 HISTORY — DX: Amyotrophic lateral sclerosis: G12.21

## 2023-03-20 MED ORDER — LIDOCAINE-EPINEPHRINE (PF) 2 %-1:200000 IJ SOLN
INTRAMUSCULAR | Status: AC
  Filled 2023-03-20: qty 20

## 2023-03-20 MED ORDER — TETANUS-DIPHTH-ACELL PERTUSSIS 5-2.5-18.5 LF-MCG/0.5 IM SUSY
0.5000 mL | PREFILLED_SYRINGE | Freq: Once | INTRAMUSCULAR | Status: AC
Start: 2023-03-20 — End: 2023-03-20
  Administered 2023-03-20: 0.5 mL via INTRAMUSCULAR
  Filled 2023-03-20: qty 0.5

## 2023-03-20 NOTE — ED Notes (Signed)
Called for transport to [facility] at this time. Misty Stanley

## 2023-03-20 NOTE — ED Provider Notes (Signed)
[place] EMERGENCY DEPARTMENT AT [facility] Provider Note   CSN: [id number] Arrival date & time: 03/20/23  1551     History {Add pertinent medical, surgical, social history, OB history to HPI:1} Chief Complaint  Patient presents with   [name] is a 62 y.o. male.  Patient has a history of ALS.  Patient fell and hit his head.  No loss of consciousness   Fall       Home Medications Prior to Admission medications   Medication Sig Start Date End Date Taking? Authorizing Provider  dorzolamide-timolol (COSOPT) 2-0.5 % ophthalmic solution Place 1 drop into the right eye 2 (two) times daily. 10/05/22   Nita Sickle K, DO  hydrALAZINE (APRESOLINE) 10 MG tablet Take 10 mg by mouth 2 (two) times daily. Patient not taking: Reported on 10/03/2022 06/08/22   [provider]  latanoprost (XALATAN) 0.005 % ophthalmic solution Place 1 drop into the right eye at bedtime. Patient not taking: Reported on 10/03/2022 12/09/21   [provider]  methocarbamol (ROBAXIN) 500 MG tablet Take 500 mg by mouth at bedtime. Patient not taking: Reported on 10/03/2022 01/31/22   [provider]  methylPREDNISolone (MEDROL DOSEPAK) 4 MG TBPK tablet Take by mouth as directed. Patient not taking: Reported on 07/07/2022 01/31/22   [provider]  NONFORMULARY OR COMPOUNDED ITEM Please provide patient miralax 17g PRN daily for constipation & Warm compress twice daily for stye.  If no improvement contact his PCP for recommendations. 01/09/23   Patel, Roxana Hires K, DO  pantoprazole (PROTONIX) 40 MG tablet Take 1 tablet (40 mg total) by mouth daily. Patient not taking: Reported on 10/03/2022 01/24/22   Elgergawy, Leana Roe, MD  thiamine 100 MG tablet Take 1 tablet (100 mg total) by mouth daily. Patient not taking: Reported on 07/07/2022 01/23/22   Elgergawy, Leana Roe, MD  vitamin B-12 (CYANOCOBALAMIN) 1000 MCG tablet Take 1 tablet (1,000 mcg total) by mouth daily. Patient  not taking: Reported on 10/03/2022 01/23/22   Elgergawy, Leana Roe, MD      Allergies    Patient has no known allergies.    Review of Systems   Review of Systems  Physical Exam Updated Vital Signs BP 129/89 (BP Location: Right Arm)   Pulse 80   Temp 98.1 F (36.7 C) (Oral)   Resp 20   Ht  (1.854 m)   Wt 55.3 kg   SpO2 100%   BMI 16.08 kg/m  Physical Exam  ED Results / Procedures / Treatments   Labs (all labs ordered are listed, but only abnormal results are displayed) Labs Reviewed - No data to display  EKG None  Radiology CT Head Wo Contrast  Result Date: 03/20/2023 CLINICAL DATA:  Trauma EXAM: CT HEAD WITHOUT CONTRAST CT CERVICAL SPINE WITHOUT CONTRAST TECHNIQUE: Multidetector CT imaging of the head and cervical spine was performed following the standard protocol without intravenous contrast. Multiplanar CT image reconstructions of the cervical spine were also generated. RADIATION DOSE REDUCTION: This exam was performed according to the departmental dose-optimization program which includes automated exposure control, adjustment of the mA and/or kV according to patient size and/or use of iterative reconstruction technique. COMPARISON:  None Available. FINDINGS: CT HEAD FINDINGS Brain: No evidence of acute infarction, hemorrhage, hydrocephalus, extra-axial collection or mass lesion/mass effect. Sequela of mild chronic microvascular ischemic change. Mineralization of the basal ganglia on the left. Vascular: No hyperdense vessel or unexpected calcification. Skull: Soft tissue injury along the frontal  scalp. Sinuses/Orbits: Trace left mastoid effusion. No middle ear effusion. Ossicles are symmetric in appearance. Paranasal sinuses are clear. Orbits are unremarkable. Other: None. CT CERVICAL SPINE FINDINGS Alignment: Trace anterolisthesis of C3 on C4. Skull base and vertebrae: No acute fracture. No primary bone lesion or focal pathologic process. Soft tissues and spinal canal: No  prevertebral fluid or swelling. No visible canal hematoma. Disc levels:  No evidence of high-grade spinal canal stenosis. Upper chest: Negative. Other: None IMPRESSION: 1. No acute intracranial abnormality. Soft tissue injury along the frontal scalp. 2. No acute fracture or traumatic malalignment of the cervical spine. Electronically Signed   By: Lorenza Cambridge M.D.   On: 03/20/2023 16:56   CT Cervical Spine Wo Contrast  Result Date: 03/20/2023 CLINICAL DATA:  Trauma EXAM: CT HEAD WITHOUT CONTRAST CT CERVICAL SPINE WITHOUT CONTRAST TECHNIQUE: Multidetector CT imaging of the head and cervical spine was performed following the standard protocol without intravenous contrast. Multiplanar CT image reconstructions of the cervical spine were also generated. RADIATION DOSE REDUCTION: This exam was performed according to the departmental dose-optimization program which includes automated exposure control, adjustment of the mA and/or kV according to patient size and/or use of iterative reconstruction technique. COMPARISON:  None Available. FINDINGS: CT HEAD FINDINGS Brain: No evidence of acute infarction, hemorrhage, hydrocephalus, extra-axial collection or mass lesion/mass effect. Sequela of mild chronic microvascular ischemic change. Mineralization of the basal ganglia on the left. Vascular: No hyperdense vessel or unexpected calcification. Skull: Soft tissue injury along the frontal scalp. Sinuses/Orbits: Trace left mastoid effusion. No middle ear effusion. Ossicles are symmetric in appearance. Paranasal sinuses are clear. Orbits are unremarkable. Other: None. CT CERVICAL SPINE FINDINGS Alignment: Trace anterolisthesis of C3 on C4. Skull base and vertebrae: No acute fracture. No primary bone lesion or focal pathologic process. Soft tissues and spinal canal: No prevertebral fluid or swelling. No visible canal hematoma. Disc levels:  No evidence of high-grade spinal canal stenosis. Upper chest: Negative. Other: None  IMPRESSION: 1. No acute intracranial abnormality. Soft tissue injury along the frontal scalp. 2. No acute fracture or traumatic malalignment of the cervical spine. Electronically Signed   By: Lorenza Cambridge M.D.   On: 03/20/2023 16:56    Procedures Procedures  {Document cardiac monitor, telemetry assessment procedure when appropriate:1}  Medications Ordered in ED Medications  lidocaine-EPINEPHrine (XYLOCAINE W/EPI) 2 %-1:200000 (PF) injection (has no administration in time range)  Tdap (BOOSTRIX) injection 0.5 mL (0.5 mLs Intramuscular Given 03/20/23 1804)    ED Course/ Medical Decision Making/ A&P   {5 cm laceration to scalp Click here for ABCD2, HEART and other calculatorsREFRESH Note before signing :1}                          Medical Decision Making Amount and/or Complexity of Data Reviewed Radiology: ordered.  Risk Prescription drug management.   Patient with contusion to head with laceration.  Laceration has been sutured and he will follow-up in 7 to 10 days to get the stitches out  {Document critical care time when appropriate:1} {Document review of labs and clinical decision tools ie heart score, Chads2Vasc2 etc:1}  {Document your independent review of radiology images, and any outside records:1} {Document your discussion with family members, caretakers, and with consultants:1} {Document social determinants of health affecting pt's care:1} {Document your decision making why or why not admission, treatments were needed:1} Final Clinical Impression(s) / ED Diagnoses Final diagnoses:  Fall, initial encounter  Laceration of scalp, initial encounter  Rx / DC Orders ED Discharge Orders     None

## 2023-03-20 NOTE — ED Triage Notes (Signed)
Pt had unwitnessed fall out of bed. Pt states his head hit the floor. Pt denies any loc. Pt's speech is normally slurred and stuttering per facility.

## 2023-03-20 NOTE — ED Provider Notes (Signed)
  Physical Exam  BP 129/89 (BP Location: Right Arm)   Pulse 80   Temp 98.1 F (36.7 C) (Oral)   Resp 20   Ht  (1.854 m)   Wt 55.3 kg   SpO2 100%   BMI 16.08 kg/m   Physical Exam Vitals and nursing note reviewed.  Constitutional:      Appearance: Normal appearance.  HENT:     Head: Normocephalic.      Comments: Macerated laceration about 5 cm in length across the forehead, oozing blood Eyes:     Conjunctiva/sclera: Conjunctivae normal.  Pulmonary:     Effort: Pulmonary effort is normal. No respiratory distress.  Skin:    General: Skin is warm and dry.  Neurological:     Mental Status: He is alert.  Psychiatric:        Mood and Affect: Mood normal.        Behavior: Behavior normal.    Procedures  .Marland KitchenLaceration Repair  Date/Time: 03/20/2023 6:24 PM  Performed by: Su Monks, PA-C Authorized by: Su Monks, PA-C   Consent:    Consent obtained:  Verbal   Consent given by:  Patient   Risks discussed:  Infection, pain, poor cosmetic result and poor wound healing Universal protocol:    Procedure explained and questions answered to patient or proxy's satisfaction: yes     Patient identity confirmed:  Provided demographic data Anesthesia:    Anesthesia method:  Local infiltration and topical application   Topical anesthesia: lido w/ epi soaked gauze.   Local anesthetic:  Lidocaine 2% WITH epi Laceration details:    Location:  Scalp   Scalp location:  Frontal   Length (cm):  5   Depth (mm):  1 Exploration:    Hemostasis achieved with:  Epinephrine and direct pressure Treatment:    Area cleansed with:  Saline   Amount of cleaning:  Standard   Irrigation solution:  Sterile saline Skin repair:    Repair method:  Sutures   Suture size:  5-0   Suture material:  Prolene   Suture technique:  Simple interrupted and horizontal mattress   Number of sutures:  5 Approximation:    Approximation:  Close Repair type:    Repair type:   Intermediate Post-procedure details:    Dressing:  Non-adherent dressing   Procedure completion:  Tolerated well, no immediate complications    Su Monks, PA-C 03/20/23 1825    Bethann Berkshire, MD 03/21/23 1030

## 2023-03-20 NOTE — Discharge Instructions (Signed)
You were seen in the emergency department for fall with scalp laceration.   We have closed your laceration(s) with sutures. These need to be removed in 7-10 days. This can be done at any doctor's office, urgent care, or emergency department.   If any of the sutures come out before it is time for removal, that is okay. Make sure to keep the area as clean and dry as possible. You can let warm soapy warm run over the area, but do NOT scrub it.   Watch out for signs of infection, like we discussed, including: increased redness, tenderness, or drainage of pus from the area. If this happens and you have not been prescribed an antibiotic, please seek medical attention for possible infection.   You can take over the counter pain medicine like ibuprofen or tylenol as needed.

## 2023-03-23 ENCOUNTER — Telehealth: Payer: Self-pay | Admitting: Neurology

## 2023-03-23 NOTE — Telephone Encounter (Signed)
New message    Patient had recent fall seen in ED at [facility].   Please advise on appt. Follow up appt 06/18/23.

## 2023-04-17 ENCOUNTER — Other Ambulatory Visit: Payer: Self-pay | Admitting: Neurology

## 2023-05-08 ENCOUNTER — Encounter: Payer: Self-pay | Admitting: Neurology

## 2023-05-08 ENCOUNTER — Ambulatory Visit (INDEPENDENT_AMBULATORY_CARE_PROVIDER_SITE_OTHER): Payer: Medicare Other | Admitting: Neurology

## 2023-05-08 VITALS — BP 108/72 | HR 111 | Ht 73.0 in

## 2023-05-08 DIAGNOSIS — G1221 Amyotrophic lateral sclerosis: Secondary | ICD-10-CM | POA: Diagnosis not present

## 2023-05-08 NOTE — Progress Notes (Signed)
Follow-up Visit   Date: 05/08/23   [name] MRN: [id number] DOB: December 13, 1960   Interim History: [name] is a 62 y.o. right-handed Caucasian male returning to the clinic for follow-up of ALS manifesting with head drop, progressive arm weakness, and dysarthria.  The patient was accompanied to the clinic by facility transport assistant who also provides collateral information.     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  There has been progressive muscle wasting and weakness in the arms >> legs, as expected with the disease course. Additionally, there has been deterioration in speech and cognition.  He had one recent fall and fortunately there does not appear to have any injury form this.  He is residing at [facility] in [place].  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  For the past 2-3 years, patient has developed bilateral arm weakness, specifically being unable to raise his arms at the shoulder and he was walking more hunched over.  He has good strength in the hands and forearms.  Weakness has been progressive.  He also has weakness with trying to keep his head upright.  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  Over the past 6 months, his weakness progressed and he was unable to raise his arms to drive.  He has been urged by [name] to go to seek medical care over the years, but he has been resistant.   Also over the past 2-3 years, he has been having speech changes, stuttering, and difficulty with speech  content/word-finding.  His friend says that since doing speech therapy, his speech has improved and he is able to get words out easier and stuttering is less.    He was hospitalized at [facility] from 2/25 - 2/27 with confusion and weakness.  He underwent MRI brain and cervical spine which was notable for moderate canal stenosis at C3-4, no significant abnormalities of the brain. Labs were notable for vitamin B12 and E deficiency. 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   Remaining labs including CBC, CK, aldolase, TSH, copper, folate, vitamin B1, HbA1c, HIV, ANA, sdDNA, ad heavy metals were normal.  He is getting home OT, PT, and ST.  He endorses significant weight loss over the last 6-8 months.  He denies difficulty with swallowing, but [name] says that his diet is very poor and he does not eat any fruits/vegetables.  He usually eats corn dog and coke every day.  Rarely drinks water.   He was working on a farm as a Engineering geologist.  He has been at the same place for 42 years.    UPDATE 07/08/2022:  He was last seen in March and at that time referred to [facility], as I was going on medical leave.  Patient did not wish to seek care at [facility] and waited for me to return.  Over the past several months, he has noticed progressive weakness in the arms and legs.  He is no longer able to lift items and has greater difficulty standing up from a low position.  Despite this, he continues to help at the farm how  he can.  He is still able to sweep and clean up around the stables, which he enjoys.  He has difficulty with ADLs and especially bathing.  He denies problems with swallowing or shortness of breath.  He had EMG on 8/3 and is here for discuss the results.   UPDATE 05/08/2023:  He is here for follow-up visit after having a fall on 4/23.  Fortunately, there was no injury. History is difficult as he has severe dysarthria and is confabulating.  He reports having  progressive weakness in the arms and is unable to raise the arms to even feed himself.  He is able to walk with a walker after getting assistance to get his hands up on the handle.  He also feels that his memory is progressively getting worse.    Medications:  Current Outpatient Medications on File Prior to Visit  Medication Sig Dispense Refill   dorzolamide-timolol (COSOPT) 2-0.5 % ophthalmic solution Place 1 drop into the right eye 2 (two) times daily. 10 mL 3   latanoprost (XALATAN) 0.005 % ophthalmic solution Place 1 drop into the right eye at bedtime.     No current facility-administered medications on file prior to visit.    Allergies: No Known Allergies  Vital Signs:  Pulse (!) 111   Ht 6\' 1"  (1.854 m)   SpO2 97%   BMI 16.08 kg/m    Neurological Exam: MENTAL STATUS:  Thought content is poor, he cannot answer questions clearly and tends to talk tangentially, significant stuttering, and significant dysarthria. Confabulating.  Unable to follow one-step commands.    CRANIAL NERVES:   Pupils equal round and reactive to light.  Normal conjugate, extra-ocular eye movements in all directions of gaze.  No ptosis.  Face is symmetric.  MOTOR:  Severe shoulder girdle muscle atrophy, diffuse active fasciculations. Neck flexion 4/5, extension 1/5 Upper Extremity:  Right  Left  Deltoid  1/5   1/5   Biceps  2/5   2/5   Triceps  2/5   2/5   Finger extensors  3/5   3/5   Finger flexors  3/5   3/5   Dorsal interossei  3/5   3/5   Abductor pollicis  3/5   3/5   Tone (Ashworth scale)  0  0   Lower Extremity:  Right  Left  Hip flexors  4/5   4/5   Knee flexors  4/5   4/5   Knee extensors  4/5   4/5   Dorsiflexors  4/5   4/5   Plantarflexors  4/5   4/5   Toe extensors  4/5   4/5   Toe flexors  4/5   4/5   Tone (Ashworth scale)  0  0   MSRs:                                           Right        Left brachioradialis 2+  2+  biceps 1+  1+  triceps 1+  1+  patellar 2+  2+  ankle  jerk 2+  2+   COORDINATION/GAIT:  Sitting in wheelchair, neck is hyperextended.      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   MRI brain 01/22/2022: No acute or reversible finding. Mild chronic small vessel ischemia.   MRI cervical spine 01/22/2022; No abnormal cord signal or enhancement. Multilevel degenerative changes as detailed above. Canal stenosis is greatest at C3-C4. Foraminal narrowing is greatest at C5-C6.   Total time spent reviewing records, interview, history/exam, documentation, and coordination of care on day of encounter:  30 min    Thank you for allowing me to participate in patient's care.  If I can answer any additional questions, I would be pleased to do so.    Sincerely,    Jorell Agne K. Allena Katz, DO

## 2023-06-10 ENCOUNTER — Emergency Department (HOSPITAL_COMMUNITY): Payer: Medicare Other

## 2023-06-10 ENCOUNTER — Inpatient Hospital Stay (HOSPITAL_COMMUNITY)
Admission: EM | Admit: 2023-06-10 | Discharge: 2023-06-14 | DRG: 871 | Disposition: A | Discharge status: Skilled Nursing Facility | Payer: Medicare Other | Attending: Family Medicine | Admitting: Family Medicine

## 2023-06-10 DIAGNOSIS — G3109 Other frontotemporal dementia: Secondary | ICD-10-CM | POA: Diagnosis present

## 2023-06-10 DIAGNOSIS — J159 Unspecified bacterial pneumonia: Secondary | ICD-10-CM | POA: Diagnosis present

## 2023-06-10 DIAGNOSIS — Z809 Family history of malignant neoplasm, unspecified: Secondary | ICD-10-CM | POA: Diagnosis not present

## 2023-06-10 DIAGNOSIS — E440 Moderate protein-calorie malnutrition: Secondary | ICD-10-CM | POA: Diagnosis not present

## 2023-06-10 DIAGNOSIS — J9601 Acute respiratory failure with hypoxia: Secondary | ICD-10-CM | POA: Diagnosis not present

## 2023-06-10 DIAGNOSIS — Z66 Do not resuscitate: Secondary | ICD-10-CM | POA: Diagnosis not present

## 2023-06-10 DIAGNOSIS — R54 Age-related physical debility: Secondary | ICD-10-CM | POA: Diagnosis present

## 2023-06-10 DIAGNOSIS — Z7189 Other specified counseling: Secondary | ICD-10-CM | POA: Diagnosis not present

## 2023-06-10 DIAGNOSIS — Z681 Body mass index (BMI) 19 or less, adult: Secondary | ICD-10-CM | POA: Diagnosis not present

## 2023-06-10 DIAGNOSIS — K219 Gastro-esophageal reflux disease without esophagitis: Secondary | ICD-10-CM | POA: Diagnosis present

## 2023-06-10 DIAGNOSIS — H5462 Unqualified visual loss, left eye, normal vision right eye: Secondary | ICD-10-CM | POA: Diagnosis present

## 2023-06-10 DIAGNOSIS — R0902 Hypoxemia: Secondary | ICD-10-CM

## 2023-06-10 DIAGNOSIS — Z1152 Encounter for screening for COVID-19: Secondary | ICD-10-CM

## 2023-06-10 DIAGNOSIS — Z87891 Personal history of nicotine dependence: Secondary | ICD-10-CM | POA: Diagnosis not present

## 2023-06-10 DIAGNOSIS — Z751 Person awaiting admission to adequate facility elsewhere: Secondary | ICD-10-CM | POA: Diagnosis not present

## 2023-06-10 DIAGNOSIS — J690 Pneumonitis due to inhalation of food and vomit: Secondary | ICD-10-CM | POA: Diagnosis present

## 2023-06-10 DIAGNOSIS — G1221 Amyotrophic lateral sclerosis: Secondary | ICD-10-CM | POA: Diagnosis present

## 2023-06-10 DIAGNOSIS — R6520 Severe sepsis without septic shock: Secondary | ICD-10-CM | POA: Diagnosis not present

## 2023-06-10 DIAGNOSIS — R471 Dysarthria and anarthria: Secondary | ICD-10-CM | POA: Diagnosis present

## 2023-06-10 DIAGNOSIS — J189 Pneumonia, unspecified organism: Principal | ICD-10-CM

## 2023-06-10 DIAGNOSIS — Z515 Encounter for palliative care: Secondary | ICD-10-CM | POA: Diagnosis not present

## 2023-06-10 DIAGNOSIS — Z811 Family history of alcohol abuse and dependence: Secondary | ICD-10-CM | POA: Diagnosis not present

## 2023-06-10 DIAGNOSIS — F0280 Dementia in other diseases classified elsewhere without behavioral disturbance: Secondary | ICD-10-CM | POA: Diagnosis present

## 2023-06-10 DIAGNOSIS — A419 Sepsis, unspecified organism: Principal | ICD-10-CM | POA: Diagnosis present

## 2023-06-10 DIAGNOSIS — R0602 Shortness of breath: Secondary | ICD-10-CM | POA: Diagnosis present

## 2023-06-10 LAB — URINALYSIS, W/ REFLEX TO CULTURE (INFECTION SUSPECTED)
Bacteria, UA: NONE SEEN
Bilirubin Urine: NEGATIVE
Glucose, UA: NEGATIVE mg/dL
Hgb urine dipstick: NEGATIVE
Ketones, ur: NEGATIVE mg/dL
Leukocytes,Ua: NEGATIVE
Nitrite: NEGATIVE
Protein, ur: NEGATIVE mg/dL
Specific Gravity, Urine: 1.014 (ref 1.005–1.030)
pH: 7 (ref 5.0–8.0)

## 2023-06-10 LAB — I-STAT CHEM 8, ED
BUN: 14 mg/dL (ref 8–23)
Calcium, Ion: 1.14 mmol/L — ABNORMAL LOW (ref 1.15–1.40)
Chloride: 104 mmol/L (ref 98–111)
Creatinine, Ser: 0.6 mg/dL — ABNORMAL LOW (ref 0.61–1.24)
Glucose, Bld: 104 mg/dL — ABNORMAL HIGH (ref 70–99)
HCT: 48 % (ref 39.0–52.0)
Hemoglobin: 16.3 g/dL (ref 13.0–17.0)
Potassium: 4.4 mmol/L (ref 3.5–5.1)
Sodium: 139 mmol/L (ref 135–145)
TCO2: 27 mmol/L (ref 22–32)

## 2023-06-10 LAB — CBC WITH DIFFERENTIAL/PLATELET
Abs Immature Granulocytes: 0.2 10*3/uL — ABNORMAL HIGH (ref 0.00–0.07)
Basophils Absolute: 0.1 10*3/uL (ref 0.0–0.1)
Basophils Relative: 0 %
Eosinophils Absolute: 0.1 10*3/uL (ref 0.0–0.5)
Eosinophils Relative: 1 %
HCT: 46.8 % (ref 39.0–52.0)
Hemoglobin: 15.2 g/dL (ref 13.0–17.0)
Immature Granulocytes: 1 %
Lymphocytes Relative: 10 %
Lymphs Abs: 1.5 10*3/uL (ref 0.7–4.0)
MCH: 31 pg (ref 26.0–34.0)
MCHC: 32.5 g/dL (ref 30.0–36.0)
MCV: 95.5 fL (ref 80.0–100.0)
Monocytes Absolute: 1.1 10*3/uL — ABNORMAL HIGH (ref 0.1–1.0)
Monocytes Relative: 7 %
Neutro Abs: 12.2 10*3/uL — ABNORMAL HIGH (ref 1.7–7.7)
Neutrophils Relative %: 81 %
Platelets: 284 10*3/uL (ref 150–400)
RBC: 4.9 MIL/uL (ref 4.22–5.81)
RDW: 12.2 % (ref 11.5–15.5)
WBC: 15.1 10*3/uL — ABNORMAL HIGH (ref 4.0–10.5)
nRBC: 0 % (ref 0.0–0.2)

## 2023-06-10 LAB — PROTIME-INR
INR: 1 (ref 0.8–1.2)
Prothrombin Time: 13 seconds (ref 11.4–15.2)

## 2023-06-10 LAB — COMPREHENSIVE METABOLIC PANEL
ALT: 48 U/L — ABNORMAL HIGH (ref 0–44)
AST: 47 U/L — ABNORMAL HIGH (ref 15–41)
Albumin: 3.6 g/dL (ref 3.5–5.0)
Alkaline Phosphatase: 70 U/L (ref 38–126)
Anion gap: 9 (ref 5–15)
BUN: 14 mg/dL (ref 8–23)
CO2: 25 mmol/L (ref 22–32)
Calcium: 8.8 mg/dL — ABNORMAL LOW (ref 8.9–10.3)
Chloride: 102 mmol/L (ref 98–111)
Creatinine, Ser: 0.58 mg/dL — ABNORMAL LOW (ref 0.61–1.24)
GFR, Estimated: >60 mL/min (ref >60)
Glucose, Bld: 107 mg/dL — ABNORMAL HIGH (ref 70–99)
Potassium: 4.1 mmol/L (ref 3.5–5.1)
Sodium: 136 mmol/L (ref 135–145)
Total Bilirubin: 0.5 mg/dL (ref 0.3–1.2)
Total Protein: 7.2 g/dL (ref 6.5–8.1)

## 2023-06-10 LAB — BLOOD GAS, VENOUS
Acid-Base Excess: 4 mmol/L — ABNORMAL HIGH (ref 0.0–2.0)
Bicarbonate: 31.6 mmol/L — ABNORMAL HIGH (ref 20.0–28.0)
Drawn by: 6932
O2 Saturation: 32.7 %
Patient temperature: 36.6
pCO2, Ven: 59 mmHg (ref 44–60)
pH, Ven: 7.34 (ref 7.25–7.43)
pO2, Ven: <31 mmHg — CL (ref 32–45)

## 2023-06-10 LAB — TROPONIN I (HIGH SENSITIVITY)
Troponin I (High Sensitivity): 8 ng/L (ref <18)
Troponin I (High Sensitivity): 10 ng/L (ref <18)

## 2023-06-10 LAB — APTT: aPTT: 27 seconds (ref 24–36)

## 2023-06-10 LAB — LACTIC ACID, PLASMA
Lactic Acid, Venous: 1.7 mmol/L (ref 0.5–1.9)
Lactic Acid, Venous: 1.1 mmol/L (ref 0.5–1.9)

## 2023-06-10 LAB — MRSA NEXT GEN BY PCR, NASAL: MRSA by PCR Next Gen: NOT DETECTED

## 2023-06-10 LAB — BRAIN NATRIURETIC PEPTIDE: B Natriuretic Peptide: 49 pg/mL (ref 0.0–100.0)

## 2023-06-10 LAB — SARS CORONAVIRUS 2 BY RT PCR: SARS Coronavirus 2 by RT PCR: NEGATIVE

## 2023-06-10 MED ORDER — IOHEXOL 350 MG/ML SOLN
75.0000 mL | Freq: Once | INTRAVENOUS | Status: AC | PRN
  Administered 2023-06-10: 75 mL via INTRAVENOUS

## 2023-06-10 MED ORDER — VANCOMYCIN HCL IN DEXTROSE 1-5 GM/200ML-% IV SOLN
1000.0000 mg | Freq: Two times a day (BID) | INTRAVENOUS | Status: DC
  Administered 2023-06-11: 1000 mg via INTRAVENOUS
  Filled 2023-06-10: qty 200

## 2023-06-10 MED ORDER — ORAL CARE MOUTH RINSE: 15.0000 mL | OROMUCOSAL | Status: DC | PRN

## 2023-06-10 MED ORDER — ONDANSETRON HCL 4 MG PO TABS: 4.0000 mg | ORAL_TABLET | Freq: Four times a day (QID) | ORAL | Status: DC | PRN

## 2023-06-10 MED ORDER — ENOXAPARIN SODIUM 40 MG/0.4ML IJ SOSY
40.0000 mg | PREFILLED_SYRINGE | INTRAMUSCULAR | Status: DC
  Administered 2023-06-10 – 2023-06-13 (×4): 40 mg via SUBCUTANEOUS
  Filled 2023-06-10 (×3): qty 0.4

## 2023-06-10 MED ORDER — POLYETHYLENE GLYCOL 3350 17 G PO PACK: 17.0000 g | PACK | Freq: Every day | ORAL | Status: DC | PRN

## 2023-06-10 MED ORDER — VANCOMYCIN HCL IN DEXTROSE 1-5 GM/200ML-% IV SOLN
1000.0000 mg | Freq: Once | INTRAVENOUS | Status: AC
  Administered 2023-06-10: 1000 mg via INTRAVENOUS
  Filled 2023-06-10: qty 200

## 2023-06-10 MED ORDER — SODIUM CHLORIDE 0.9 % IV SOLN: INTRAVENOUS | Status: AC

## 2023-06-10 MED ORDER — ACETAMINOPHEN 650 MG RE SUPP: 650.0000 mg | Freq: Four times a day (QID) | RECTAL | Status: DC | PRN

## 2023-06-10 MED ORDER — SODIUM CHLORIDE 0.9 % IV BOLUS (SEPSIS)
1000.0000 mL | Freq: Once | INTRAVENOUS | Status: AC
  Administered 2023-06-10: 1000 mL via INTRAVENOUS

## 2023-06-10 MED ORDER — ONDANSETRON HCL 4 MG/2ML IJ SOLN: 4.0000 mg | Freq: Four times a day (QID) | INTRAMUSCULAR | Status: DC | PRN

## 2023-06-10 MED ORDER — SODIUM CHLORIDE 0.9 % IV SOLN
2.0000 g | Freq: Three times a day (TID) | INTRAVENOUS | Status: DC
  Administered 2023-06-10 – 2023-06-14 (×11): 2 g via INTRAVENOUS
  Filled 2023-06-10 (×11): qty 12.5

## 2023-06-10 MED ORDER — ACETAMINOPHEN 325 MG PO TABS: 650.0000 mg | ORAL_TABLET | Freq: Four times a day (QID) | ORAL | Status: DC | PRN

## 2023-06-10 MED ORDER — SODIUM CHLORIDE 0.9 % IV SOLN
2.0000 g | Freq: Once | INTRAVENOUS | Status: AC
  Administered 2023-06-10: 2 g via INTRAVENOUS
  Filled 2023-06-10: qty 12.5

## 2023-06-10 NOTE — Assessment & Plan Note (Addendum)
Follows with neurologist Dr. Allena Katz.  Per last note 05/08/2023, patient with ALS, with frontotemporal dementia, manifesting with progressive bibrachial plegia, head drop and cognitive changes.  [facility].

## 2023-06-10 NOTE — Assessment & Plan Note (Addendum)
Pneumonia with hypoxia.  Patient had eaten this morning before symptoms started.  History of ALS, hence question aspiration.  But he had been on treatment for respiratory tract infection with Levaquin and Augmentin started 7/9.  Tmax 99.5.  Leukocytosis of 15.1.  Tachypnea- rr 16-28.  Meeting severe sepsis criteria with respiratory failure.  Lactic acid 1.7. -Consider failure of outpatient treatment, continue with IV vancomycin and cefepime - CTA chest negative for PE, bilateral lower lobe pneumonia, opacification of segmental branches of left lower lobe bronchi specially his lingular bronchi-mucous plug or aspiration.  Endobronchial solid lesions considered less likely but possible. -1 L bolus given, continue n/s 100cc/hr x 15hrs -remain n.p.o. -Speech therapy eval in a.m. -Check COVID

## 2023-06-10 NOTE — H&P (Signed)
History and Physical    [name] ZOX:[id number] DOB: [birth year] DOA: 06/10/2023  PCP: Glendale Chard, DO   Patient coming from: [facility]  I have personally briefly reviewed patient's old medical records in [facility] Link  Chief Complaint: Difficulty breathing  HPI: [name] is a 62 y.o. male with medical history significant for ALS with frontotemporal dementia, eosinophilic esophagitis.  Patient was brought to the ED via EMS with reports of difficulty breathing and cough.  At the time of my evaluation patient is awake alert, but he has baseline dysarthria, dementia, he attempts to answer simple questions but speech is hard to understand. Per chart review,EDP, patient ate breakfast this morning like he normally does then started having difficulty breathing, also report that patient was generally weak and he has had a cough.  He was diagnosed with upper respiratory tract infection about a week ago on his med list at bedside from nursing home, he was placed on Levaquin and Augmentin 7/9.  EMS reports O2 sats down to 81% on room air, they placed him on nonrebreather.  ED Course: Tmax 99.5.  Heart rate 55-69.  Respiratory rate 16-28.  Blood pressure systolic 109-132.  Patient switched over from nonrebreather to high flow nasal cannula, sats dropped to 80s.  Currently on 6 L, sats 100%. WBC 15.1.  Lactic acid 1.7.  BNP 49.  Troponin 8.  VBG shows pH of 7.34, pCO2 59.  Chest x-ray clear.  UA not suggestive of infection. CTA chest negative for PE, Bilateral lower lobe airspace consolidation, consistent with pneumonia. Opacification of the segmental branches of the left lower lobe bronchi, and partially lingular bronchi, may represent mucous plugging or aspiration. Endobronchial solid lesions are considered less likely, but possible.shows bilateral lower lobe airspace consolidation also possible mucous plug or aspiration. Started on IV vancomycin and cefepime.  1 L  bolus given.  Review of Systems: Unable to assess due to speech impairment.  Past Medical History:  Diagnosis Date   ALS (amyotrophic lateral sclerosis) (HCC)    Clavicle fracture 07/27/2015   Eosinophilic esophagitis 11/27/2006   Previously treated with systemic corticosteroid therapy as the patient could not afford Flovent at that time. Patient was referred to the allergist but did not go to the appointment.   GERD (gastroesophageal reflux disease)    Seasonal allergies     Past Surgical History:  Procedure Laterality Date   ESOPHAGOGASTRODUODENOSCOPY  01/31/2007   [id number] food impaction/ Schatzki's ring, inflamed distal esophageal mucosa   ESOPHAGOGASTRODUODENOSCOPY  02/14/2007   RMR: Somewhat ribbed appearing esophageal mucosa (biopsy-eosinophilic esophagitis), prominent Schatzki ring status post dilation with Maloney dilators.  Pale raised esophageal nodule status post biopsy (eosinophilic esophagitis). Moderate sized hiatal hernia.    ESOPHAGOGASTRODUODENOSCOPY (EGD) WITH ESOPHAGEAL DILATION N/A 04/25/2013   ZHY:[id number] reflux esophagitis/eosinophilic esophagitis biopsy proven/Hiatal hernia/Antral erosions. conservative esophageal dilation     reports that he quit smoking about 43 years ago. His smoking use included cigarettes. He started smoking about 68 years ago. He has a 5 pack-year smoking history. He has quit using smokeless tobacco. He reports that he does not drink alcohol and does not use drugs.  No Known Allergies  Family History  Problem Relation Age of Onset   Cancer Mother    Alcohol abuse Brother    Colon cancer Neg Hx    Liver disease Neg Hx     Prior to Admission medications   Medication Sig Start Date End Date Taking? Authorizing Provider  dorzolamide-timolol (COSOPT) 2-0.5 % ophthalmic solution Place 1 drop into the right eye 2 (two) times daily. 10/05/22   Patel, Donika K, DO  latanoprost (XALATAN) 0.005 % ophthalmic solution Place 1 drop into the  right eye at bedtime. 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  Neck: normal, supple, no masses, no thyromegaly Respiratory: O2 sats 93 to 100% on 6 L, clear to auscultation bilaterally, no wheezing, no crackles.  Mild increased work of breathing. Cardiovascular: Regular rate and rhythm, no murmurs / rubs / gallops. No extremity edema.  Extremities warm.   Abdomen: no tenderness, no masses palpated. No hepatosplenomegaly. Bowel sounds positive.  Musculoskeletal: no clubbing / cyanosis.  Possible flexion contracture to left fourth and fifth fingers.  Skin: no rashes, lesions, ulcers. No induration Neurologic: Able to squeeze,but with poor grip strength worse on the left upper extremity, able to lift bilateral lower extremities against gravity, significant stuttering of speech Psychiatric: Unable to fully assess due to obvious speech impairment,  cognitive deficits, but awake and alert.  Labs on Admission: I have personally reviewed following labs and imaging studies  CBC: Recent Labs  Lab 06/10/23 1307 06/10/23 1349  WBC  --  15.1*  NEUTROABS  --  12.2*  HGB 16.3 15.2  HCT 48.0 46.8  MCV  --  95.5  PLT  --  284   Basic Metabolic Panel: Recent Labs  Lab 06/10/23 1307 06/10/23 1349  NA  139 136  K 4.4 4.1  CL 104 102  CO2  --  25  GLUCOSE 104* 107*  BUN 14 14  CREATININE 0.60* 0.58*  CALCIUM  --  8.8*   GFR: CrCl cannot be calculated (Unknown ideal weight.). Liver Function Tests: Recent Labs  Lab 06/10/23 1349  AST 47*  ALT 48*  ALKPHOS 70  BILITOT 0.5  PROT 7.2  ALBUMIN 3.6   Coagulation Profile: Recent Labs  Lab 06/10/23 1349  INR 1.0   Urine analysis:    Component Value Date/Time   COLORURINE YELLOW 06/10/2023 1449   APPEARANCEUR CLEAR 06/10/2023 1449   LABSPEC 1.014 06/10/2023 1449   PHURINE 7.0 06/10/2023 1449   GLUCOSEU NEGATIVE 06/10/2023 1449   HGBUR NEGATIVE 06/10/2023 1449   BILIRUBINUR NEGATIVE 06/10/2023 1449   KETONESUR NEGATIVE 06/10/2023 1449   PROTEINUR NEGATIVE 06/10/2023 1449   NITRITE NEGATIVE 06/10/2023 1449   LEUKOCYTESUR NEGATIVE 06/10/2023 1449    Radiological Exams on Admission: CT Angio Chest PE W/Cm &/Or Wo Cm  Result Date: 06/10/2023 CLINICAL DATA:  Pulmonary embolism suspected. EXAM: CT ANGIOGRAPHY CHEST WITH CONTRAST TECHNIQUE: Multidetector CT imaging of the chest was performed using the  standard protocol during bolus administration of intravenous contrast. Multiplanar CT image reconstructions and MIPs were obtained to evaluate the vascular anatomy. RADIATION DOSE REDUCTION: This exam was performed according to the departmental dose-optimization program which includes automated exposure control, adjustment of the mA and/or kV according to patient size and/or use of iterative reconstruction technique. CONTRAST:  75mL OMNIPAQUE IOHEXOL 350 MG/ML SOLN COMPARISON:  July 24, 2015 FINDINGS: Cardiovascular: Satisfactory opacification of the pulmonary arteries to the segmental level. No evidence of pulmonary embolism. Normal heart size. No pericardial effusion. Calcific atherosclerotic disease of the coronary arteries. Mediastinum/Nodes: No enlarged mediastinal, hilar, or axillary lymph nodes. Thyroid gland, trachea, and  esophagus demonstrate no significant findings. Lungs/Pleura: Bilateral lower lobe airspace consolidation. Opacification of the segmental branches of the left lower lobe bronchi, and partially lingular bronchi. Upper Abdomen: No acute abnormality. Musculoskeletal: No chest wall abnormality. No acute or significant osseous findings. Review of the MIP images confirms the above findings. IMPRESSION: 1. No evidence of pulmonary embolus. 2. Bilateral lower lobe airspace consolidation, consistent with pneumonia. 3. Opacification of the segmental branches of the left lower lobe bronchi, and partially lingular bronchi, may represent mucous plugging or aspiration. Endobronchial solid lesions are considered less likely, but possible. 4. Calcific atherosclerotic disease of the coronary arteries. 5. Aortic atherosclerosis. Aortic Atherosclerosis (ICD10-I70.0). Electronically Signed   By: Ted Mcalpine M.D.   On: 06/10/2023 16:20   DG Chest Port 1 View  Result Date: 06/10/2023 CLINICAL DATA:  Shortness of breath. EXAM: PORTABLE CHEST 1 VIEW COMPARISON:  Chest x-ray dated July 23, 2015. FINDINGS: The heart size and mediastinal contours are within normal limits. Normal pulmonary vascularity. No focal consolidation, pleural effusion, or pneumothorax. No acute osseous abnormality. Old healed fracture deformity of the proximal right clavicle. IMPRESSION: No active disease. Electronically Signed   By: Obie Dredge M.D.   On: 06/10/2023 12:14    EKG: Independently reviewed.  Sinus rhythm, rate 63, QTc 411.  No significant change from prior.  Assessment/Plan Principal Problem:   PNA (pneumonia) Active Problems:   Acute hypoxic respiratory failure (HCC)   Severe sepsis (HCC)   Amyotrophic lateral sclerosis (ALS) (HCC)   Frontotemporal dementia (HCC)   Dysarthria  Assessment and Plan: * PNA (pneumonia) Pneumonia with hypoxia.  Patient had eaten this morning before symptoms started.  History of ALS, hence  question aspiration.  But he had been on treatment for respiratory tract infection with Levaquin and Augmentin started 7/9.  Tmax 99.5.  Leukocytosis of 15.1.  Tachypnea- rr 16-28.  Meeting severe sepsis criteria with respiratory failure.  Lactic acid 1.7. -Consider failure of outpatient treatment, continue with IV vancomycin and cefepime - CTA chest negative for PE, bilateral lower lobe pneumonia, opacification of segmental branches of left lower lobe bronchi specially his lingular bronchi-mucous plug or aspiration.  Endobronchial solid lesions considered less likely but possible. -1 L bolus given, continue n/s 100cc/hr x 15hrs -remain n.p.o. -Speech therapy eval in a.m. -Check COVID  Severe sepsis (HCC) As documented under pneumonia. -Follow-up blood cultures - hydrate  Acute hypoxic respiratory failure (HCC) O2 sats dropped to 80s when transferring from nonrebreather to high flow.  Currently on 6 L, sats 93 to 100% on my evaluation.  2/2 pneumonia.  Amyotrophic lateral sclerosis (ALS) (HCC) Follows with neurologist Dr. Allena Katz.  Per last note 05/08/2023, patient with ALS, with frontotemporal dementia, manifesting with progressive bibrachial plegia, head drop and cognitive changes.  [facility] resident.   DVT prophylaxis: Lovenox Code Status: FULL code-no documentation  at bedside stating otherwise. Family Communication: None at bedside. Friends listed as contacts in demographics. Disposition Plan: ~ > 2 days Consults called: None Admission status: Inpt Stepdown I certify that at the point of admission it is my clinical judgment that the patient will require inpatient hospital care spanning beyond 2 midnights from the point of admission due to high intensity of service, high risk for further deterioration and high frequency of surveillance required.     Author: Onnie Boer, MD 06/10/2023 7:18 PM  For on call review [email or web address].

## 2023-06-10 NOTE — ED Provider Notes (Signed)
[name] EMERGENCY DEPARTMENT AT [facility] Provider Note   CSN: [id number] Arrival date & time: 06/10/23  1128     History  Chief Complaint  Patient presents with   Weakness   HPI [name] is a 62 y.o. male with ALS, GERD presenting for shortness of breath and weakness.  [facility] resides at [facility].  Was diagnosed with a upper respiratory infection about a week ago and was on a course of antibiotics.  Patient ate breakfast this morning like he normally does and then started to have trouble breathing and felt weak.  EMS was called initial O2 sat was 81% on room air.  He was placed on nonrebreather at that point and sats improved to 100%.  States has had a cough, felt weak and endorses subjective fever.   Weakness      Home Medications Prior to Admission medications   Medication Sig Start Date End Date Taking? Authorizing Provider  dorzolamide-timolol (COSOPT) 2-0.5 % ophthalmic solution Place 1 drop into the right eye 2 (two) times daily. 10/05/22   Patel, Donika K, DO  latanoprost (XALATAN) 0.005 % ophthalmic solution Place 1 drop into the right eye at bedtime. 12/09/21   [provider]      Allergies    Patient has no known allergies.    Review of Systems   Review of Systems  Neurological:  Positive for weakness.    Physical Exam   Vitals:   06/10/23 1436 06/10/23 1447  BP: 126/78   Pulse: 66   Resp: 20   Temp: 98.5 F (36.9 C) 99.5 F (37.5 C)  SpO2: 100%     CONSTITUTIONAL:  ill-appearing, NAD, on non rebreather, cachectic NEURO:  Alert and oriented x 3, CN 3-12 grossly intact EYES:  eyes equal and reactive ENT/NECK:  Supple, no stridor  CARDIO:  Regular rate and rhythm, appears well-perfused  PULM: Tachypneic, breath sounds diminished in the bases bilaterally, coarse, rales, no wheezes GI/GU:  non-distended, soft MSK/SPINE:  No gross deformities, no edema, moves all extremities  SKIN: pale, no rash,  atraumatic   *Additional and/or pertinent findings included in MDM below   ED Results / Procedures / Treatments   Labs (all labs ordered are listed, but only abnormal results are displayed) Labs Reviewed  COMPREHENSIVE METABOLIC PANEL - Abnormal; Notable for the following components:      Result Value   Glucose, Bld 107 (*)    Creatinine, Ser 0.58 (*)    Calcium 8.8 (*)    AST 47 (*)    ALT 48 (*)    All other components within normal limits  CBC WITH DIFFERENTIAL/PLATELET - Abnormal; Notable for the following components:   WBC 15.1 (*)    Neutro Abs 12.2 (*)    Monocytes Absolute 1.1 (*)    Abs Immature Granulocytes 0.20 (*)    All other components within normal limits  BLOOD GAS, VENOUS - Abnormal; Notable for the following components:   pO2, Ven <31 (*)    Bicarbonate 31.6 (*)    Acid-Base Excess 4.0 (*)    All other components within normal limits  I-STAT CHEM 8, ED - Abnormal; Notable for the following components:   Creatinine, Ser 0.60 (*)    Glucose, Bld 104 (*)    Calcium, Ion 1.14 (*)    All other components within normal limits  CULTURE, BLOOD (ROUTINE X 2)  CULTURE, BLOOD (ROUTINE X 2)  MRSA NEXT GEN BY PCR,  NASAL  LACTIC ACID, PLASMA  PROTIME-INR  APTT  URINALYSIS, W/ REFLEX TO CULTURE (INFECTION SUSPECTED)  BRAIN NATRIURETIC PEPTIDE  LACTIC ACID, PLASMA  TROPONIN I (HIGH SENSITIVITY)  TROPONIN I (HIGH SENSITIVITY)    EKG None  Radiology CT Angio Chest PE W/Cm &/Or Wo Cm  Result Date: 06/10/2023 CLINICAL DATA:  Pulmonary embolism suspected. EXAM: CT ANGIOGRAPHY CHEST WITH CONTRAST TECHNIQUE: Multidetector CT imaging of the chest was performed using the standard protocol during bolus administration of intravenous contrast. Multiplanar CT image reconstructions and MIPs were obtained to evaluate the vascular anatomy. RADIATION DOSE REDUCTION: This exam was performed according to the departmental dose-optimization program which includes automated exposure  control, adjustment of the mA and/or kV according to patient size and/or use of iterative reconstruction technique. CONTRAST:  75mL OMNIPAQUE IOHEXOL 350 MG/ML SOLN COMPARISON:  July 24, 2015 FINDINGS: Cardiovascular: Satisfactory opacification of the pulmonary arteries to the segmental level. No evidence of pulmonary embolism. Normal heart size. No pericardial effusion. Calcific atherosclerotic disease of the coronary arteries. Mediastinum/Nodes: No enlarged mediastinal, hilar, or axillary lymph nodes. Thyroid gland, trachea, and esophagus demonstrate no significant findings. Lungs/Pleura: Bilateral lower lobe airspace consolidation. Opacification of the segmental branches of the left lower lobe bronchi, and partially lingular bronchi. Upper Abdomen: No acute abnormality. Musculoskeletal: No chest wall abnormality. No acute or significant osseous findings. Review of the MIP images confirms the above findings. IMPRESSION: 1. No evidence of pulmonary embolus. 2. Bilateral lower lobe airspace consolidation, consistent with pneumonia. 3. Opacification of the segmental branches of the left lower lobe bronchi, and partially lingular bronchi, may represent mucous plugging or aspiration. Endobronchial solid lesions are considered less likely, but possible. 4. Calcific atherosclerotic disease of the coronary arteries. 5. Aortic atherosclerosis. Aortic Atherosclerosis (ICD10-I70.0). Electronically Signed   By: Ted Mcalpine M.D.   On: 06/10/2023 16:20   DG Chest Port 1 View  Result Date: 06/10/2023 CLINICAL DATA:  Shortness of breath. EXAM: PORTABLE CHEST 1 VIEW COMPARISON:  Chest x-ray dated July 23, 2015. FINDINGS: The heart size and mediastinal contours are within normal limits. Normal pulmonary vascularity. No focal consolidation, pleural effusion, or pneumothorax. No acute osseous abnormality. Old healed fracture deformity of the proximal right clavicle. IMPRESSION: No active disease. Electronically Signed    By: Obie Dredge M.D.   On: 06/10/2023 12:14    Procedures Ultrasound ED Peripheral IV (Provider)  Date/Time: 06/10/2023 4:47 PM  Performed by: Gareth Eagle, PA-C Authorized by: Gareth Eagle, PA-C   Procedure details:    Indications: multiple failed IV attempts     Skin Prep: isopropyl alcohol     Location:  Left AC   Angiocath:  20 G   Bedside Ultrasound Guided: No     Images: not archived     Patient tolerated procedure without complications: Yes     Dressing applied: Yes   .Critical Care  Performed by: Gareth Eagle, PA-C Authorized by: Gareth Eagle, PA-C   Critical care provider statement:    Critical care time (minutes):  30   Critical care was necessary to treat or prevent imminent or life-threatening deterioration of the following conditions:  Respiratory failure and sepsis   Critical care was time spent personally by me on the following activities:  Development of treatment plan with patient or surrogate, discussions with consultants, evaluation of patient's response to treatment, examination of patient, ordering and review of laboratory studies, ordering and review of radiographic studies, ordering and performing treatments and interventions, pulse oximetry, re-evaluation  of patient's condition and review of old charts     Medications Ordered in ED Medications  vancomycin (VANCOCIN) IVPB 1000 mg/200 mL premix (has no administration in time range)  sodium chloride 0.9 % bolus 1,000 mL (0 mLs Intravenous Stopped 06/10/23 1435)  ceFEPIme (MAXIPIME) 2 g in sodium chloride 0.9 % 100 mL IVPB (0 g Intravenous Stopped 06/10/23 1436)  vancomycin (VANCOCIN) IVPB 1000 mg/200 mL premix (0 mg Intravenous Stopped 06/10/23 1547)  iohexol (OMNIPAQUE) 350 MG/ML injection 75 mL (75 mLs Intravenous Contrast Given 06/10/23 1522)    ED Course/ Medical Decision Making/ A&P                             Medical Decision Making Amount and/or Complexity of Data Reviewed Labs:  ordered. Radiology: ordered. ECG/medicine tests: ordered.  Risk Prescription drug management.   Initial Impression and Ddx 62 year old ill-appearing male presenting for respiratory distress.  Exam notable for tachypnea, diminished breath sounds, rales and coarse BS.  DDx includes pneumonia, PE, pneumothorax, ACS, sepsis, CHF exacerbation. Patient PMH that increases complexity of ED encounter:  ALS  Interpretation of Diagnostics - I independent reviewed and interpreted the labs as followed: Leukocytosis, elevated AST and ALT  - I independently visualized the following imaging with scope of interpretation limited to determining acute life threatening conditions related to emergency care: CTA chest, which revealed bilateral lower lobe consolidation  -I personally reviewed and interpreted EKG which revealed sinus rhythm  Patient Reassessment and Ultimate Disposition/Management Transitioned him from nonrebreather to high flow nasal cannula.  Was briefly hypoxic while placing the nasal cannula on,  But settled out on high flow.  On serial reassessments, maintaining sats in the high 90s to 100%.  Appears comfortable from respiratory standpoint.  CTA was negative for PE but did reveal consolidation in the bases suggestive of pneumonia.  Placed on IV antibiotics for concern for sepsis secondary to pneumonia.  Admitted to hospital service with Dr. Wendall Stade.  Patient management required discussion with the following services or consulting groups:  Hospitalist Service  Complexity of Problems Addressed Acute complicated illness or Injury  Additional Data Reviewed and Analyzed Further history obtained from: Past medical history and medications listed in the EMR and Prior ED visit notes  Patient Encounter Risk Assessment Consideration of hospitalization         Final Clinical Impression(s) / ED Diagnoses Final diagnoses:  Pneumonia due to infectious organism, unspecified laterality,  unspecified Cooper of lung  Hypoxia    Rx / DC Orders ED Discharge Orders     None         Gareth Eagle, PA-C 06/10/23 1747    Bethann Berkshire, MD 06/11/23 1546

## 2023-06-10 NOTE — ED Triage Notes (Signed)
Pt. BIB [facility] from [facility]. Was recently diagnosed a week ago with an "upper respiratory infection". EMS got to facility and pt. Was 81% on RA, NRB applied. [facility] noted pt. Decline in last 3 hours, and more difficulty breathing and very weak. T: in ED is 97.9. 100% on NRB 15L

## 2023-06-10 NOTE — Assessment & Plan Note (Signed)
As documented under pneumonia. -Follow-up blood cultures - hydrate

## 2023-06-10 NOTE — Plan of Care (Signed)

## 2023-06-10 NOTE — Progress Notes (Signed)
Pharmacy Antibiotic Note  [name] is a 62 y.o. male admitted on 06/10/2023 with pneumonia.  Pharmacy has been consulted for cefepime dosing.  Of note, patient has ALS, weight down to 47.7 kg. CrCl may be overestimated. Keep in mind if remains on it.  Plan: Cefepime 2 grams IV every 8 hours Monitor clinical progress, cultures/sensitivities, renal function, abx plan   Height: 6\' 1"  (185.4 cm) Weight: 47.7 kg (105 lb 2.6 oz) IBW/kg (Calculated) : 79.9  Temp (24hrs), Avg:98.5 F (36.9 C), Min:97.9 F (36.6 C), Max:99.5 F (37.5 C)  Recent Labs  Lab 06/10/23 1307 06/10/23 1349 06/10/23 1732  WBC  --  15.1*  --   CREATININE 0.60* 0.58*  --   LATICACIDVEN  --  1.7 1.1    Estimated Creatinine Clearance: 62.9 mL/min (A) (by C-G formula based on SCr of 0.58 mg/dL (L)).    No Known Allergies  Antimicrobials this admission: 7/14 vancomycin  >>  7/14 cefepime >>   Dose adjustments this admission:   Microbiology results: 7/14 BCx: pending 7/14 MRSA PCR: sent   Thank you for allowing pharmacy to be a part of this patient's care.   Signe Colt, PharmD 06/10/2023 8:10 PM  **Pharmacist phone directory can be found on [email or web address] listed under [facility]**

## 2023-06-10 NOTE — Assessment & Plan Note (Signed)
O2 sats dropped to 80s when transferring from nonrebreather to high flow.  Currently on 6 L, sats 93 to 100% on my evaluation.  2/2 pneumonia.

## 2023-06-10 NOTE — Progress Notes (Signed)
Pharmacy Antibiotic Note  [name] is a 62 y.o. male with ALS admitted on 06/10/2023 with pneumonia.  Pharmacy has been consulted for Vancomycin dosing. Noted patient with recent weight loss - last recorded weight in April was 55kg (BMI 16). Requesting updated weight  Plan: Vancomycin 1gm q 12 (Predicted AUC 577, Vd 0.72l/kg, ke 0.087, CrCl>100) Monitor renal function, cultures, and clinical course Weigh patient  Temp (24hrs), Avg:97.9 F (36.6 C), Min:97.9 F (36.6 C), Max:97.9 F (36.6 C)  No Known Allergies  Antimicrobials this admission: 7/14 cefepime x1  Microbiology results: 7/14 BCx: P 7/14 mrsa-pcr P   Thank you for allowing pharmacy to be a part of this patient's care.  Caryl Asp, PharmD Clinical Pharmacist 06/10/2023 3:07 PM

## 2023-06-11 DIAGNOSIS — E440 Moderate protein-calorie malnutrition: Secondary | ICD-10-CM

## 2023-06-11 LAB — BASIC METABOLIC PANEL
Anion gap: 9 (ref 5–15)
BUN: 10 mg/dL (ref 8–23)
CO2: 24 mmol/L (ref 22–32)
Calcium: 8.6 mg/dL — ABNORMAL LOW (ref 8.9–10.3)
Chloride: 104 mmol/L (ref 98–111)
Creatinine, Ser: 0.5 mg/dL — ABNORMAL LOW (ref 0.61–1.24)
GFR, Estimated: >60 mL/min (ref >60)
Glucose, Bld: 112 mg/dL — ABNORMAL HIGH (ref 70–99)
Potassium: 3.6 mmol/L (ref 3.5–5.1)
Sodium: 137 mmol/L (ref 135–145)

## 2023-06-11 LAB — CBC
HCT: 47.5 % (ref 39.0–52.0)
Hemoglobin: 15.5 g/dL (ref 13.0–17.0)
MCH: 30.9 pg (ref 26.0–34.0)
MCHC: 32.6 g/dL (ref 30.0–36.0)
MCV: 94.8 fL (ref 80.0–100.0)
Platelets: 281 10*3/uL (ref 150–400)
RBC: 5.01 MIL/uL (ref 4.22–5.81)
RDW: 12.3 % (ref 11.5–15.5)
WBC: 11.4 10*3/uL — ABNORMAL HIGH (ref 4.0–10.5)
nRBC: 0 % (ref 0.0–0.2)

## 2023-06-11 LAB — HIV ANTIBODY (ROUTINE TESTING W REFLEX): HIV Screen 4th Generation wRfx: NONREACTIVE

## 2023-06-11 MED ORDER — CHLORHEXIDINE GLUCONATE CLOTH 2 % EX PADS
6.0000 | MEDICATED_PAD | Freq: Every day | CUTANEOUS | Status: DC
  Administered 2023-06-10 – 2023-06-14 (×5): 6 via TOPICAL

## 2023-06-11 MED ORDER — DM-GUAIFENESIN ER 30-600 MG PO TB12
1.0000 | ORAL_TABLET | Freq: Two times a day (BID) | ORAL | Status: DC
  Administered 2023-06-11 – 2023-06-14 (×6): 1 via ORAL
  Filled 2023-06-11 (×6): qty 1

## 2023-06-11 MED ORDER — VANCOMYCIN HCL 500 MG/100ML IV SOLN: 500.0000 mg | Freq: Two times a day (BID) | INTRAVENOUS | Status: DC

## 2023-06-11 MED ORDER — DEXTROSE-SODIUM CHLORIDE 5-0.9 % IV SOLN: INTRAVENOUS | Status: DC

## 2023-06-11 MED ORDER — SODIUM CHLORIDE 0.9 % IV SOLN: INTRAVENOUS | Status: DC

## 2023-06-11 NOTE — Plan of Care (Signed)

## 2023-06-11 NOTE — Progress Notes (Addendum)
PROGRESS NOTE    [name]  SAY:[id number] DOB: [birth year] DOA: 06/10/2023 PCP: Glendale Chard, DO   Brief Narrative:  [name] is a 62 y.o. male with medical history significant for ALS with semantic-variant frontotemporal dementia, and eosinophilic esophagitis who presented to the ED via EMS with reports of difficulty breathing and cough and is admitted for treatment of pneumonia complicated by acute hypoxic respiratory failure.    He was diagnosed with upper respiratory tract infection about a week ago and was placed on Levaquin and Augmentin 7/9. EMS reported O2 sats down to 81% on RA, and placed him on nonrebreather.   On presentation, pt was afebrile with RR 16-28 on 6 L, sats 100%. WBC 15.1k.  Lactic acid 1.7. VBG shows pH of 7.34, pCO2 59.  7/14 CXR clear.  UA wnl. CTA chest with bilateral lower lobe airspace consolidation, consistent with pneumonia. Opacification of the segmental branches of the left lower lobe bronchi, and partially lingular bronchi, may represent mucous plugging or aspiration. [name] was started on IV vancomycin and cefepime. 1 L bolus given. On 7/15 leukocytosis improved 15.1k>11.4k, remains afebrile, with weak cough and on 4L [place].    Assessment & Plan:   Principal Problem:   PNA (pneumonia) Active Problems:   Acute hypoxic respiratory failure (HCC)   Amyotrophic lateral sclerosis (ALS) (HCC)   Frontotemporal dementia (HCC)   Dysarthria   Severe sepsis (HCC)  Assessment and Plan: PNA (pneumonia), aspiration? Began tx of URI on 7/9 with Levaquin and Augmentin. Pt presented with report that cough symptoms began after eating breakfast. Hx of ALS and CT Chest evidence of b/l LL pna and opacification of segmental branches of LLL bronchi c/f aspiration vs mucous plug. Started on IV Vancomycin and Cefepime on 7/14. WBCs improved to 11.4k.  - Continue IV Vancomycin and Cefepime, initiated 7/14 - Dextromethorphan-guaifenesin BID to aid mucous clearing  -  mIVF with NS 32ml/hr   Severe sepsis (HCC) As documented under pneumonia. No longer meeting criteria, but will continue to watch blood cultures. - Follow-up blood cultures  Goals of care Pt with an overall poor prognosis with ALS. Reviewed his outpatient Neurology notes from 05/08/23 and 07/07/22, which described that [name] did not intend to pursue treatment of his ALS, declining Riluzole, Radicava and Relyvrio. He has lost significant weight and now weighs 47.7kg with low Body mass index is 13.87 kg/m. Had a brief goals of care conversation with [name] this afternoon, which was facilitated by one of his caregivers from [facility] (pt with significant dysarthria). [name] reaffirmed his full code status, and does not have a POA. With this current admission for a possible aspiration pneumonia, he does not demonstrate a strong cough that seems capable of clearing secretions. - Consulted Palliative Care  Amyotrophic lateral sclerosis (ALS) (HCC) Follows with neurologist Dr. Allena Katz.  Per last note 05/08/2023, patient has ALS and semantic-variant frontotemporal dementia, manifesting with progressive bibrachial plegia, head drop and cognitive changes.  He is a [facility] resident and is well known to their staff, who kindly assisted our team in communicating with [name]. - Currently NPO  - Speech eval impending   Acute hypoxic respiratory failure (HCC) Secondary to bacterial pneumonia. Initially on presentation, O2 sats dropped to 80s when transferring from nonrebreather to [facility].  Today, 7/15 sats>95% on 4-5L [place]. - Continue appropriate supplemental O2 via [place]  Mild to moderate protein-calorie malnutrition Low Body mass index  is 13.87 kg/m. Albumin is fair at 3.6. Creatinine is low, likely a marker of muscle wasting 2/2 ALS. - Consult dietician.    DVT prophylaxis: Lovenox Code Status: Full code Family Communication: Friends listed as contacts   Consultants:   None  Procedures:  None  Antimicrobials:  IV Vancomycin and Cefepime initiated 7/14   Subjective: Patient seen and evaluated today with no new acute complaints or concerns. No acute concerns or events noted overnight. He has semantic-variant frontotemporal dementia (per Neurology's notes) which causes difficulty in understanding him, however a member of the staff at his nursing facility helped facilitate communication.   Had a brief goals of care conversation to clarify [name]' current wishes regarding his code status. Pt reaffirmed his full code status today.   Objective: Vitals:   06/11/23 0800 06/11/23 0900 06/11/23 1000 06/11/23 1100  BP: 108/66 111/72 127/68 117/79  Pulse: (!) 50 (!) 50 (!) 59 64  Resp: (!) 33 (!) 21 (!) 21 17  Temp:    97.6 F (36.4 C)  TempSrc:    Axillary  SpO2: 100% 100% 99% 100%  Weight:      Height:        Intake/Output Summary (Last 24 hours) at 06/11/2023 1305 Last data filed at 06/11/2023 1024 Gross per 24 hour  Intake 1392.5 ml  Output 1546 ml  Net -153.5 ml   Filed Weights   06/10/23 1851 06/11/23 0500  Weight: 47.7 kg 47.7 kg    Examination:  General exam: Appears calm and comfortable  Respiratory system: Crackles. Weak cough. Respiratory effort normal on 4L [place]. Cardiovascular system: S1 & S2 heard, RRR.  Gastrointestinal system: Abdomen is soft and non tender Central nervous system: Alert and awake Extremities: No edema, muscle wasting Skin: No significant lesions noted Psychiatry: Flat affect.    Data Reviewed: I have personally reviewed following labs and imaging studies  CBC: Recent Labs  Lab 06/10/23 1307 06/10/23 1349 06/11/23 0423  WBC  --  15.1* 11.4*  NEUTROABS  --  12.2*  --   HGB 16.3 15.2 15.5  HCT 48.0 46.8 47.5  MCV  --  95.5 94.8  PLT  --  284 281   Basic Metabolic Panel: Recent Labs  Lab 06/10/23 1307 06/10/23 1349 06/11/23 0423  NA 139 136 137  K 4.4 4.1 3.6  CL 104 102 104  CO2  --  25  24  GLUCOSE 104* 107* 112*  BUN 14 14 10   CREATININE 0.60* 0.58* 0.50*  CALCIUM  --  8.8* 8.6*   GFR: Estimated Creatinine Clearance: 62.9 mL/min (A) (by C-G formula based on SCr of 0.5 mg/dL (L)). Liver Function Tests: Recent Labs  Lab 06/10/23 1349  AST 47*  ALT 48*  ALKPHOS 70  BILITOT 0.5  PROT 7.2  ALBUMIN 3.6   No results for input(s): "LIPASE", "AMYLASE" in the last 168 hours. No results for input(s): "AMMONIA" in the last 168 hours. Coagulation Profile: Recent Labs  Lab 06/10/23 1349  INR 1.0   Cardiac Enzymes: No results for input(s): "CKTOTAL", "CKMB", "CKMBINDEX", "TROPONINI" in the last 168 hours. BNP (last 3 results) No results for input(s): "PROBNP" in the last 8760 hours. HbA1C: No results for input(s): "HGBA1C" in the last 72 hours. CBG: No results for input(s): "GLUCAP" in the last 168 hours. Lipid Profile: No results for input(s): "CHOL", "HDL", "LDLCALC", "TRIG", "CHOLHDL", "LDLDIRECT" in the last 72 hours. Thyroid Function Tests: No results for input(s): "TSH", "T4TOTAL", "FREET4", "T3FREE", "THYROIDAB" in the  last 72 hours. Anemia Panel: No results for input(s): "VITAMINB12", "FOLATE", "FERRITIN", "TIBC", "IRON", "RETICCTPCT" in the last 72 hours. Sepsis Labs: Recent Labs  Lab 06/10/23 1349 06/10/23 1732  LATICACIDVEN 1.7 1.1    Recent Results (from the past 240 hour(s))  Blood Culture (routine x 2)     Status: None (Preliminary result)   Collection Time: 06/10/23  1:49 PM   Specimen: BLOOD LEFT FOREARM  Result Value Ref Range Status   Specimen Description   Final    BLOOD LEFT FOREARM BOTTLES DRAWN AEROBIC AND ANAEROBIC   Special Requests Blood Culture adequate volume  Final   Culture   Final    NO GROWTH < 24 HOURS Performed at [facility], [address] [place], [place] [address]    Report Status PENDING  Incomplete  Blood Culture (routine x 2)     Status: None (Preliminary result)   Collection Time: 06/10/23  2:24 PM    Specimen: BLOOD RIGHT FOREARM  Result Value Ref Range Status   Specimen Description   Final    BLOOD RIGHT FOREARM BOTTLES DRAWN AEROBIC AND ANAEROBIC   Special Requests Blood Culture adequate volume  Final   Culture   Final    NO GROWTH < 24 HOURS Performed at [facility], [address], [place], [place] [address]    Report Status PENDING  Incomplete  SARS Coronavirus 2 by RT PCR (hospital order, performed in [facility] hospital lab) *cepheid single result test* Anterior Nasal Swab     Status: None   Collection Time: 06/10/23  6:45 PM   Specimen: Anterior Nasal Swab  Result Value Ref Range Status   SARS Coronavirus 2 by RT PCR NEGATIVE NEGATIVE Final    Comment: (NOTE) SARS-CoV-2 target nucleic acids are NOT DETECTED.  The SARS-CoV-2 RNA is generally detectable in upper and lower respiratory specimens during the acute phase of infection. The lowest concentration of SARS-CoV-2 viral copies this assay can detect is 250 copies / mL. A negative result does not preclude SARS-CoV-2 infection and should not be used as the sole basis for treatment or other patient management decisions.  A negative result may occur with improper specimen collection / handling, submission of specimen other than nasopharyngeal swab, presence of viral mutation(s) within the areas targeted by this assay, and inadequate number of viral copies (<250 copies / mL). A negative result must be combined with clinical observations, patient history, and epidemiological information.  Fact Sheet for Patients:   RoadLapTop.co.za  Fact Sheet for Healthcare Providers: http://kim-miller.com/  This test is not yet approved or  cleared by the [place] FDA and has been authorized for detection and/or diagnosis of SARS-CoV-2 by FDA under an Emergency Use Authorization (EUA).  This EUA will remain in effect (meaning this test can be used) for the duration of the COVID-19  declaration under Section 564(b)(1) of the Act, 21 U.S.C. section 360bbb-3(b)(1), unless the authorization is terminated or revoked sooner.  Performed at [facility], [address] [place], [place] [address]   MRSA Next Gen by PCR, Nasal     Status: None   Collection Time: 06/10/23  6:52 PM   Specimen: Nasal Mucosa; Nasal Swab  Result Value Ref Range Status   MRSA by PCR Next Gen NOT DETECTED NOT DETECTED Final    Comment: (NOTE) The GeneXpert MRSA Assay (FDA approved for NASAL specimens only), is one component of a comprehensive MRSA colonization surveillance program. It is not intended to diagnose MRSA infection nor to  guide or monitor treatment for MRSA infections. Test performance is not FDA approved in patients less than 45 years old. Performed at [facility], [address], [place], [place] [address]          Radiology Studies: CT Angio Chest PE W/Cm &/Or Wo Cm  Result Date: 06/10/2023 CLINICAL DATA:  Pulmonary embolism suspected. EXAM: CT ANGIOGRAPHY CHEST WITH CONTRAST TECHNIQUE: Multidetector CT imaging of the chest was performed using the standard protocol during bolus administration of intravenous contrast. Multiplanar CT image reconstructions and MIPs were obtained to evaluate the vascular anatomy. RADIATION DOSE REDUCTION: This exam was performed according to the departmental dose-optimization program which includes automated exposure control, adjustment of the mA and/or kV according to patient size and/or use of iterative reconstruction technique. CONTRAST:  75mL OMNIPAQUE IOHEXOL 350 MG/ML SOLN COMPARISON:  July 24, 2015 FINDINGS: Cardiovascular: Satisfactory opacification of the pulmonary arteries to the segmental level. No evidence of pulmonary embolism. Normal heart size. No pericardial effusion. Calcific atherosclerotic disease of the coronary arteries. Mediastinum/Nodes: No enlarged mediastinal, hilar, or axillary lymph nodes. Thyroid gland, trachea, and esophagus  demonstrate no significant findings. Lungs/Pleura: Bilateral lower lobe airspace consolidation. Opacification of the segmental branches of the left lower lobe bronchi, and partially lingular bronchi. Upper Abdomen: No acute abnormality. Musculoskeletal: No chest wall abnormality. No acute or significant osseous findings. Review of the MIP images confirms the above findings. IMPRESSION: 1. No evidence of pulmonary embolus. 2. Bilateral lower lobe airspace consolidation, consistent with pneumonia. 3. Opacification of the segmental branches of the left lower lobe bronchi, and partially lingular bronchi, may represent mucous plugging or aspiration. Endobronchial solid lesions are considered less likely, but possible. 4. Calcific atherosclerotic disease of the coronary arteries. 5. Aortic atherosclerosis. Aortic Atherosclerosis (ICD10-I70.0). Electronically Signed   By: Ted Mcalpine M.D.   On: 06/10/2023 16:20   DG Chest Port 1 View  Result Date: 06/10/2023 CLINICAL DATA:  Shortness of breath. EXAM: PORTABLE CHEST 1 VIEW COMPARISON:  Chest x-ray dated July 23, 2015. FINDINGS: The heart size and mediastinal contours are within normal limits. Normal pulmonary vascularity. No focal consolidation, pleural effusion, or pneumothorax. No acute osseous abnormality. Old healed fracture deformity of the proximal right clavicle. IMPRESSION: No active disease. Electronically Signed   By: Obie Dredge M.D.   On: 06/10/2023 12:14        Scheduled Meds:  Chlorhexidine Gluconate Cloth  6 each Topical Daily   enoxaparin (LOVENOX) injection  40 mg Subcutaneous Q24H   Continuous Infusions:  ceFEPime (MAXIPIME) IV 2 g (06/11/23 1153)   vancomycin       LOS: 1 day    Time spent: 35 minutes  Signed,  Marcelline Mates, MS4 Working with Dr. Maurilio Lovely

## 2023-06-11 NOTE — Progress Notes (Signed)
   06/11/23 1328  TOC Brief Assessment  Insurance and Status Reviewed  Patient has primary care physician Yes  Home environment has been reviewed From [facility]  Prior level of function: need assistance  Prior/Current Home Services No current home services  Social Determinants of Health Reivew SDOH reviewed no interventions necessary  Transition of care needs no transition of care needs at this time   TOC Screened will update and follow up with [facility] - FL2 started.  Transition of Care Department [facility]) has reviewed patient and no TOC needs have been identified at this time. We will continue to monitor patient advancement through interdisciplinary progression rounds. If new patient transition needs arise, please place a TOC consult.

## 2023-06-11 NOTE — Progress Notes (Signed)
Initial Nutrition Assessment  DOCUMENTATION CODES:   Underweight  INTERVENTION:   -RD will follow for diet advancement and add supplements as appropriate -If aggressive care is desired and aligns with pt's goals of care, consider artificial means of nutrition/ hydration:  Initiate Osmolite 1.5 @ 20 ml/hr and increase by 10 ml every 12 hours to goal rate of 60 ml/hr.   Tube feeding regimen provides 2160 kcal (100% of needs), 90 grams of protein, and 1097 ml of H2O.    -If feedings are initiated, monitor Mg, K, and Phos and replete as needed secondary to high refeeding risk -If feedings are initiated, recommend 100 mg thiamine daily x 5 days due to high refeeding risk  NUTRITION DIAGNOSIS:   Inadequate oral intake related to inability to eat as evidenced by NPO status.  GOAL:   Patient will meet greater than or equal to 90% of their needs  MONITOR:   Diet advancement  REASON FOR ASSESSMENT:   Consult Assessment of nutrition requirement/status  ASSESSMENT:   Ptwith medical history significant for ALS with frontotemporal dementia, eosinophilic esophagitis.  Patient was brought in with reports of difficulty breathing and cough.  Pt admitted with aspiration pneumonia.   Reviewed I/O's: -28 ml x 24 hours and -154 ml since admission  UOP: 125 ml x 24 hours  Pt unavailable at time of visit. Attempted to speak with pt via call to hospital room phone, however, unable to reach. RD unable to obtain further nutrition-related history or complete nutrition-focused physical exam at this time.    Per MD notes, pt with semantic-variant frontotemporal dementia, which makes pt difficult to understand. Pt is a resident of [facility].   Reviewed wt hx; pt has experienced a 13.7% wt loss over the past 3 months, which is significant for time frame. Noted progressive wt loss since 12/2021. Highly suspect malnutrition given significant wt loss and underweight status, however,  unable to identify at this time.   Pt is currently NPO and is awaiting SLP evaluation.   Palliative care consult pending for goals of care. Artificial means of nutrition/ hydration would not prevent further aspiration episodes. Suspect continued decline in swallowing status secondary to ALS diagnosis.   Medications reviewed and include mucinex and dextrose 5%-0.9% sodium chloride infusion @ 50 ml/hr.   Labs reviewed.    Diet Order:   Diet Order             Diet NPO time specified  Diet effective now                   EDUCATION NEEDS:   No education needs have been identified at this time  Skin:  Skin Assessment: Reviewed RN Assessment  Last BM:  06/10/23 (type 5)  Height:   Ht Readings from Last 1 Encounters:  06/10/23 6\' 1"  (1.854 m)    Weight:   Wt Readings from Last 1 Encounters:  06/11/23 47.7 kg    Ideal Body Weight:  83.6 kg  BMI:  Body mass index is 13.87 kg/m.  Estimated Nutritional Needs:   Kcal:  1700-1900  Protein:  90-105 grams  Fluid:  > 1.7 L    Levada Schilling, RD, LDN, CDCES Registered Dietitian II Certified Diabetes Care and Education Specialist Please refer to [facility] for RD and/or RD on-call/weekend/after hours pager

## 2023-06-11 NOTE — Hospital Course (Addendum)
KATELYN KOHLMEYER is a 62 y.o. male with medical history significant for ALS with semantic-variant frontotemporal dementia, and eosinophilic esophagitis who presented to the ED via EMS with reports of difficulty breathing and cough and is admitted for treatment of pneumonia complicated by acute hypoxic respiratory failure.    He was diagnosed with upper respiratory tract infection about a week ago and was placed on Levaquin and Augmentin 7/9. EMS reported O2 sats down to 81% on RA, and placed him on nonrebreather.   On presentation, pt was afebrile with RR 16-28 on 6 L, sats 100%. WBC 15.1k.  Lactic acid 1.7. VBG shows pH of 7.34, pCO2 59.  7/14 CXR clear.  UA wnl. CTA chest with bilateral lower lobe airspace consolidation, consistent with pneumonia. Opacification of the segmental branches of the left lower lobe bronchi, and partially lingular bronchi, may represent mucous plugging or aspiration. Mr Mcniel was started on IV vancomycin and cefepime. 1 L bolus given. On 7/15 pt was seen by nutrition and speech path, recommended dysphagia 1 diet due to effects of ALS. Consulted palliative care as well. On 7/16, O2 requirement improved to RA, continued leukocytosis of 13.7k while on Cefepime, remains afebrile, with weak cough.

## 2023-06-11 NOTE — Evaluation (Signed)
Clinical/Bedside Swallow Evaluation Patient Details  Name: [name] MRN: [id number] Date of Birth: July 11, 1961  Today's Date: 06/11/2023 Time: SLP Start Time (ACUTE ONLY): 1610 SLP Stop Time (ACUTE ONLY): 1636 SLP Time Calculation (min) (ACUTE ONLY): 26 min  Past Medical History:  Past Medical History:  Diagnosis Date   ALS (amyotrophic lateral sclerosis) (HCC)    Clavicle fracture 07/27/2015   Eosinophilic esophagitis 11/27/2006   Previously treated with systemic corticosteroid therapy as the patient could not afford Flovent at that time. Patient was referred to the allergist but did not go to the appointment.   GERD (gastroesophageal reflux disease)    Seasonal allergies    Past Surgical History:  Past Surgical History:  Procedure Laterality Date   ESOPHAGOGASTRODUODENOSCOPY  01/31/2007   VOZ:DGUYQIHKVQ food impaction/ Schatzki's ring, inflamed distal esophageal mucosa   ESOPHAGOGASTRODUODENOSCOPY  02/14/2007   RMR: Somewhat ribbed appearing esophageal mucosa (biopsy-eosinophilic esophagitis), prominent Schatzki ring status post dilation with Maloney dilators.  Pale raised esophageal nodule status post biopsy (eosinophilic esophagitis). Moderate sized hiatal hernia.    ESOPHAGOGASTRODUODENOSCOPY (EGD) WITH ESOPHAGEAL DILATION N/A 04/25/2013   QVZ:DGLOVFI reflux esophagitis/eosinophilic esophagitis biopsy proven/Hiatal hernia/Antral erosions. conservative esophageal dilation   HPI:  [name] is a 62 y.o. male with medical history significant for ALS with frontotemporal dementia, eosinophilic esophagitis.  Patient was brought to the ED via EMS with reports of difficulty breathing and cough.  At the time of my evaluation patient is awake alert, but he has baseline dysarthria, dementia, he attempts to answer simple questions but speech is hard to understand.  Per chart review,EDP, patient ate breakfast this morning like he normally does then started having difficulty breathing, also  report that patient was generally weak and he has had a cough.  He was diagnosed with upper respiratory tract infection about a week ago on his med list at bedside from nursing home, he was placed on Levaquin and Augmentin 7/9. CTA chest with bilateral lower lobe airspace consolidation, consistent with pneumonia. Opacification of the segmental branches of the left lower lobe bronchi, and partially lingular bronchi, may represent mucous plugging or aspiration. Mr Hageman was started on IV vancomycin and cefepime. BSE requested.    Assessment / Plan / Recommendation  Clinical Impression  Clinical swallow evaluation completed at bedside. Pt known to this SLP from previous outpatient evaluation over a year ago for communication. Pt with ALS and now resides at [facility] facility and is dependent upon caregivers for feeding. Pt with motor speech impairment due to ALS with FTD with dysfluency. Pt appeared to recognize this SLP from previous visit and he was able to tell me his friend's name, [name], who previously helped him get to outpatient therapy. Pt with signficant muscle wasting, open mouth breathing, mild dry oral mucosa, and weak, congested cough. Pt assessed with ice chips, thin water and NTL via straw sips, puree, and mechanical soft textures. Pt with suspected delay in swallow trigger and several swallows elicited for each bolus. Pt without overt coughing, however do suspect some pharyngeal dysphagia. Pt had previously stated that he would not want a feeding tube and SLP provided education that feeding tubes do not prevent aspiration, but can be beneficial in supplementing nutrition. [facility] typically does not honor thickened liquids at their facility. Recommend D1/puree and thin liquids via small straw sips with 100% feeder assist and complete MBSS tomorrow to provide more indepth swallow evaluation and assist in care plan. Palliative consult pending. SLP will follow. Above to  RN. OK to administer  medications whole in puree for now. SLP Visit Diagnosis: Dysphagia, unspecified (R13.10)    Aspiration Risk  Moderate aspiration risk;Risk for inadequate nutrition/hydration    Diet Recommendation Dysphagia 1 (Puree);Thin liquid    Liquid Administration via: Straw Medication Administration: Whole meds with puree Supervision: Staff to assist with self feeding;Full supervision/cueing for compensatory strategies Compensations: Slow rate;Small sips/bites Postural Changes: Seated upright at 90 degrees;Remain upright for at least 30 minutes after po intake    Other  Recommendations Oral Care Recommendations: Staff/trained caregiver to provide oral care;Oral care before and after PO    Recommendations for follow up therapy are one component of a multi-disciplinary discharge planning process, led by the attending physician.  Recommendations may be updated based on patient status, additional functional criteria and insurance authorization.  Follow up Recommendations Follow physician's recommendations for discharge plan and follow up therapies      Assistance Recommended at Discharge    Functional Status Assessment Patient has had a recent decline in their functional status and demonstrates the ability to make significant improvements in function in a reasonable and predictable amount of time.  Frequency and Duration min 2x/week  1 week       Prognosis Prognosis for improved oropharyngeal function: Fair Barriers to Reach Goals: Severity of deficits;Other (Comment) (ALS)      Swallow Study   General Date of Onset: 06/10/23 HPI: [name] is a 62 y.o. male with medical history significant for ALS with frontotemporal dementia, eosinophilic esophagitis.  Patient was brought to the ED via EMS with reports of difficulty breathing and cough.  At the time of my evaluation patient is awake alert, but he has baseline dysarthria, dementia, he attempts to answer simple questions but speech is hard  to understand.  Per chart review,EDP, patient ate breakfast this morning like he normally does then started having difficulty breathing, also report that patient was generally weak and he has had a cough.  He was diagnosed with upper respiratory tract infection about a week ago on his med list at bedside from nursing home, he was placed on Levaquin and Augmentin 7/9. CTA chest with bilateral lower lobe airspace consolidation, consistent with pneumonia. Opacification of the segmental branches of the left lower lobe bronchi, and partially lingular bronchi, may represent mucous plugging or aspiration. Mr Delone was started on IV vancomycin and cefepime. BSE requested. Type of Study: Bedside Swallow Evaluation Previous Swallow Assessment: none on record Diet Prior to this Study: NPO Temperature Spikes Noted: No Respiratory Status: Room air History of Recent Intubation: No Behavior/Cognition: Cooperative;Alert;Pleasant mood Oral Cavity Assessment: Dry Oral Care Completed by SLP: Yes Oral Cavity - Dentition: Edentulous Vision:  (unable to self feed due to bilateral arm weakness) Self-Feeding Abilities: Total assist Patient Positioning: Upright in bed Baseline Vocal Quality: Normal (mild wetness/congestion) Volitional Cough: Congested;Weak Volitional Swallow: Able to elicit    Oral/Motor/Sensory Function Overall Oral Motor/Sensory Function: Mild impairment Facial ROM:  (gross oral motor weakness and reduced coordination) Facial Symmetry: Within Functional Limits Lingual Symmetry: Within Functional Limits Lingual Strength: Reduced Velum: Within Functional Limits Mandible: Within Functional Limits   Ice Chips Ice chips: Within functional limits Presentation: Spoon   Thin Liquid Thin Liquid: Impaired Presentation: Straw Pharyngeal  Phase Impairments: Suspected delayed Swallow;Decreased hyoid-laryngeal movement;Wet Vocal Quality    Nectar Thick Nectar Thick Liquid: Impaired Presentation:  Straw Pharyngeal Phase Impairments: Suspected delayed Swallow;Decreased hyoid-laryngeal movement;Wet Vocal Quality   Honey Thick Honey Thick Liquid: Not tested  Puree Puree: Within functional limits Presentation: Spoon   Solid     Solid: Within functional limits Presentation: Spoon (mech soft)     Thank you,  Havery Moros, CCC-SLP [phone]  Ethin Drummond 06/11/2023,4:44 PM

## 2023-06-12 ENCOUNTER — Inpatient Hospital Stay (HOSPITAL_COMMUNITY): Payer: Medicare Other

## 2023-06-12 ENCOUNTER — Encounter (HOSPITAL_COMMUNITY): Payer: Self-pay | Admitting: Internal Medicine

## 2023-06-12 DIAGNOSIS — Z515 Encounter for palliative care: Secondary | ICD-10-CM | POA: Diagnosis not present

## 2023-06-12 DIAGNOSIS — J189 Pneumonia, unspecified organism: Secondary | ICD-10-CM | POA: Diagnosis not present

## 2023-06-12 DIAGNOSIS — G1221 Amyotrophic lateral sclerosis: Secondary | ICD-10-CM | POA: Diagnosis not present

## 2023-06-12 DIAGNOSIS — Z7189 Other specified counseling: Secondary | ICD-10-CM

## 2023-06-12 LAB — CBC
HCT: 42.5 % (ref 39.0–52.0)
Hemoglobin: 14 g/dL (ref 13.0–17.0)
MCH: 30.9 pg (ref 26.0–34.0)
MCHC: 32.9 g/dL (ref 30.0–36.0)
MCV: 93.8 fL (ref 80.0–100.0)
Platelets: 280 10*3/uL (ref 150–400)
RBC: 4.53 MIL/uL (ref 4.22–5.81)
RDW: 12.2 % (ref 11.5–15.5)
WBC: 13.7 10*3/uL — ABNORMAL HIGH (ref 4.0–10.5)
nRBC: 0 % (ref 0.0–0.2)

## 2023-06-12 LAB — BASIC METABOLIC PANEL
Anion gap: 7 (ref 5–15)
BUN: 7 mg/dL — ABNORMAL LOW (ref 8–23)
CO2: 27 mmol/L (ref 22–32)
Calcium: 8.6 mg/dL — ABNORMAL LOW (ref 8.9–10.3)
Chloride: 103 mmol/L (ref 98–111)
Creatinine, Ser: 0.49 mg/dL — ABNORMAL LOW (ref 0.61–1.24)
GFR, Estimated: >60 mL/min (ref >60)
Glucose, Bld: 114 mg/dL — ABNORMAL HIGH (ref 70–99)
Potassium: 4.1 mmol/L (ref 3.5–5.1)
Sodium: 137 mmol/L (ref 135–145)

## 2023-06-12 LAB — MAGNESIUM: Magnesium: 2.1 mg/dL (ref 1.7–2.4)

## 2023-06-12 NOTE — Progress Notes (Signed)
Modified Barium Swallow Study  Patient Details  Name: [name] MRN: [id number] Date of Birth: [birth year]  Today's Date: 06/12/2023  Modified Barium Swallow completed.  Full report located under Chart Review in the Imaging Section.  History of Present Illness [name] is a 62 y.o. male with medical history significant for ALS with frontotemporal dementia, eosinophilic esophagitis.  Patient was brought to the ED via EMS with reports of difficulty breathing and cough.  At the time of my evaluation patient is awake alert, but he has baseline dysarthria, dementia, he attempts to answer simple questions but speech is hard to understand.  Per chart review,EDP, patient ate breakfast this morning like he normally does then started having difficulty breathing, also report that patient was generally weak and he has had a cough.  He was diagnosed with upper respiratory tract infection about a week ago on his med list at bedside from nursing home, he was placed on Levaquin and Augmentin 7/9. CTA chest with bilateral lower lobe airspace consolidation, consistent with pneumonia. Opacification of the segmental branches of the left lower lobe bronchi, and partially lingular bronchi, may represent mucous plugging or aspiration. [name] was started on IV vancomycin and cefepime. BSE completed yesterday and MBSS recommended.   Clinical Impression Oropharyngeal swallow is essentially WNL with use of straw for administration of thins due to Pt unable to self feed. Pt was assessed with straw sips thin, puree, regular textures. Swallow trigger was generally at the level of the valleculae (puree and solids) and pyriforms (liquids) with min reduced tongue base retraction resulting in min vallecular, lateral channel, and pyriform residue after the initial swallow, which reduced to none after spontaneous secondary swallow. No penetration/aspiration observed and no significant residuals post swallow. Esophageal sweep  revealed stasis of barium and barium tablet in the distal esophagus with delayed emptying and some backflow/retrograde movement noted (but not up to the level of cervical esophagus). Pt was given a bite of puree, which cleared the barium tablet from distal esophagus. Recommend regular textures with chopped meats and 1:1 feeder assist and thin liquids via straw sips when Pt is sitting as upright as possible and remain upright for 30+ minutes after meals due to risk of post prandial aspiration (reflux risk). Pt will need to be monitored at his facility for any changes in swallow due to his deconditioned status in setting of ALS (difficulty generating a strong cough at times). Medications can be presented whole in puree or with water. Above to RN.    Factors that may increase risk of adverse event in presence of aspiration [name] 2021): Poor general health and/or compromised immunity;Reduced cognitive function;Limited mobility;Frail or deconditioned;Dependence for feeding and/or oral hygiene  Swallow Evaluation Recommendations Recommendations: PO diet PO Diet Recommendation: Regular;Dysphagia 3 (Mechanical soft);Thin liquids (Level 0) Liquid Administration via: Straw Medication Administration: Whole meds with liquid Supervision: Staff to assist with self-feeding;Full assist for feeding Swallowing strategies  : Slow rate;Small bites/sips;Multiple dry swallows after each bite/sip Postural changes: Position pt fully upright for meals;Stay upright 30-60 min after meals Oral care recommendations: Oral care BID (2x/day);Staff/trained caregiver to provide oral care    Thank you,  [name], CCC-SLP [phone]   [name] 06/12/2023,3:19 PM

## 2023-06-12 NOTE — Consult Note (Signed)
Consultation Note Date: 06/12/2023   Patient Name: [name]  DOB: 07-30-1961  MRN: [id number]  Age / Sex: 62 y.o., male  PCP: Glendale Chard, DO Referring Physician: Erick Blinks, DO  Reason for Consultation: Establishing goals of care  HPI/Patient Profile: 62 y.o. male  with past medical history of ALS with some manic/variant frontotemporal dementia, eosinophilic esophagitis, living in ALF/group home [facility], admitted on 06/10/2023 with pneumonia, question aspiration, severe sepsis upon admit which has resolved.  Per neurology notes from 05/08/2023 and 07/07/2022 share that [name] did not intend to pursue treatment of ALS and has lost significant weight now weighing 47.7 kg.   Clinical Assessment and Goals of Care: I have reviewed medical records including EPIC notes, labs and imaging, received report from RN, assessed the patient.  [name] is lying quietly in bed.  He appears acutely/chronically ill and quite frail, cachectic.  He has experienced weight loss with a BMI of 14.  [name] was resting comfortably, but woke easily when I entered the room.  He greeted me, making and somewhat keeping eye contact.  He was able to tell me his name, and that we were in the hospital, but not the month.  He has difficulty expressing himself.  When I mention that he lives in [place] he "lit up" with a big smile and vigorous nodding affirmatively.  I ask if he has ever been married or has children he tells me no.  I ask if he has any living siblings, but he is unclear.  I ask who would make choices if he were unable.  He attempts to speak to me but I cannot understand him.  When I ask if he would name his friend, Ocie Bob, he again "lit up".  I share that I will reach out to [name].  Call to friend, Ocie Bob. No answer, left voice mail.  Return call from friend, Ocie Bob, to discuss diagnosis prognosis, GOC,  EOL wishes, disposition and options.  I introduced Palliative Medicine as specialized medical care for people living with serious illness. It focuses on providing relief from the symptoms and stress of a serious illness. The goal is to improve quality of life for both the patient and the family.  We discussed a brief life review of the patient.  [name] shares that [name] is unmarried and has no children.  He had 1 brother who died many years ago.  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  She states that he would not go to a doctor until she forced him to go a little over a year and a half ago when he was diagnosed with ALS.  She shares that he moved into [facility] November 2023.  She shares that she understands that, at some point, [name] would no longer be able  to care for him.  We then focused on their current illness.  We talk about pneumonia and the treatment plan, IV antibiotics.  We talk about aspiration risk and speech therapy recommendations.  We talk about ALS as a progressive disease.  [name] is able to share accurately recommendations from neurology appointments.  I share that it would not be surprising from [name] to be sick again within the next few weeks or months.  The natural disease trajectory and expectations at EOL were discussed.  Advanced directives, concepts specific to code status, artifical feeding and hydration, and rehospitalization were considered and discussed.  We talk about "treat the treatable, but allow a natural passing".  [name] endorses tearfully that [name] would not want to be on life support.  She talks about his [name] faith.  We also talk about the concept of "let nature take its course".  I shared some examples, sharing that it is not illegal nor unethical to let nature take its course.  Hospice and Palliative Care services  outpatient were explained and offered.  [name] shares that neurology has recommended palliative care in the past but they have not sought this.  We talked about the benefits of palliative services, at home treat the treatable hospice services and also comfort care and residential hospice.  [name] shares that she is experience with residential hospice through another friend.    Discussed the importance of continued conversation with family and the medical providers regarding overall plan of care and treatment options, ensuring decisions are within the context of the patient's values and GOCs. Questions and concerns were addressed.  The family was encouraged to call with questions or concerns.  PMT will continue to support holistically.  Conference with attending, bedside nursing staff, transition of care team related to patient condition, needs, goals of care, disposition.   HCPOA  OTHER -Ocie Bob, 40+ year friend.  [name] is unmarried and has no children.  He had 1 brother who died many years ago.    SUMMARY OF RECOMMENDATIONS   At this point continue to treat the treatable but no CPR or intubation. Time for outcomes. PT evaluation, understands that he may not qualify/be rehabable Would prefer to return to [facility] if possible Understands he may need short-term rehab/long-term care Encouraged to consider palliative/hospice    Code Status/Advance Care Planning: DNR - treat the treatable but allowing natural passing.  Orders adjusted.  Symptom Management:  Per hospitalist, no additional needs at this time.  Palliative Prophylaxis:  Oral Care and Turn Reposition  Additional Recommendations (Limitations, Scope, Preferences): Continue to treat but no CPR or intubation  Psycho-social/Spiritual:  Desire for further Chaplaincy support:no Additional Recommendations: Caregiving  Support/Resources and Education on Hospice  Prognosis:  Unable to determine, guarded.  3 months or less would  not be surprising based on frailty, advancing ALS.   Discharge Planning: To Be Determined      Primary Diagnoses: Present on Admission:  PNA (pneumonia)  Acute hypoxic respiratory failure (HCC)  Amyotrophic lateral sclerosis (ALS) (HCC)  Frontotemporal dementia (HCC)  Dysarthria  Severe sepsis (HCC)   I have reviewed the medical record, interviewed the patient and family, and examined the patient. The following aspects are pertinent.  Past Medical History:  Diagnosis Date   ALS (amyotrophic lateral sclerosis) (HCC)    Clavicle fracture 07/27/2015   Eosinophilic esophagitis 11/27/2006   Previously treated with systemic corticosteroid therapy as the patient could not afford Flovent at that time. Patient was referred to  the allergist but did not go to the appointment.   GERD (gastroesophageal reflux disease)    Seasonal allergies    Social History   Socioeconomic History   Marital status: Single    Spouse name: Not on file   Number of children: Not on file   Years of education: Not on file   Highest education level: Not on file  Occupational History   Not on file  Tobacco Use   Smoking status: Former    Current packs/day: 0.00    Average packs/day: 0.2 packs/day for 25.0 years (5.0 ttl pk-yrs)    Types: Cigarettes    Start date: 08/04/1954    Quit date: 08/05/1979    Years since quitting: 43.8   Smokeless tobacco: Former  Substance and Sexual Activity   Alcohol use: No   Drug use: No   Sexual activity: Not on file  Other Topics Concern   Not on file  Social History Narrative   Right handed   No caffeine   Social Determinants of Health   Financial Resource Strain: Not on file  Food Insecurity: No Food Insecurity (06/10/2023)   Hunger Vital Sign    Worried About Running Out of Food in the Last Year: Never true    Ran Out of Food in the Last Year: Never true  Transportation Needs: No Transportation Needs (06/10/2023)   PRAPARE - Scientist, research (physical sciences) (Medical): No    Lack of Transportation (Non-Medical): No  Physical Activity: Not on file  Stress: Not on file  Social Connections: Not on file   Family History  Problem Relation Age of Onset   Cancer Mother    Alcohol abuse Brother    Colon cancer Neg Hx    Liver disease Neg Hx    Scheduled Meds:  Chlorhexidine Gluconate Cloth  6 each Topical Daily   dextromethorphan-guaiFENesin  1 tablet Oral BID   enoxaparin (LOVENOX) injection  40 mg Subcutaneous Q24H   Continuous Infusions:  ceFEPime (MAXIPIME) IV 2 g (06/12/23 0451)   dextrose 5 % and 0.9 % NaCl 50 mL/hr at 06/11/23 1838   PRN Meds:.acetaminophen **OR** acetaminophen, ondansetron **OR** ondansetron (ZOFRAN) IV, mouth rinse, polyethylene glycol Medications Prior to Admission:  Prior to Admission medications   Medication Sig Start Date End Date Taking? Authorizing Provider  Ascorbic Acid (VITAMIN C) 1000 MG tablet Take 1,000 mg by mouth 4 (four) times daily. For 7 days 06/05/23  Yes [provider]  ELDERBERRY PO Take 1 capsule by mouth 4 (four) times daily. For 7 days 06/06/23 06/13/23 Yes [provider]  latanoprost (XALATAN) 0.005 % ophthalmic solution Place 1 drop into the right eye at bedtime. 12/09/21  Yes [provider]  ZINC CHELATED PO Take 3 tablets by mouth 2 (two) times daily. For 7 days 06/05/23  Yes [provider]  dorzolamide-timolol (COSOPT) 2-0.5 % ophthalmic solution Place 1 drop into the right eye 2 (two) times daily. Patient not taking: Reported on 06/10/2023 10/05/22   Nita Sickle K, DO   No Known Allergies Review of Systems  Unable to perform ROS: Other    Physical Exam Vitals and nursing note reviewed.  Constitutional:      General: He is not in acute distress.    Appearance: He is ill-appearing.     Comments: Frail and thin   Cardiovascular:     Rate and Rhythm: Normal rate.  Pulmonary:     Effort: Pulmonary effort is normal. No respiratory  distress.     Comments: Weak productive cough  Skin:    General: Skin is warm and dry.  Neurological:     Mental Status: He is alert.     Comments: Oriented to person and place, not month   Psychiatric:     Comments: Calm, cooperative, not fearful      Vital Signs: BP 122/85   Pulse 70   Temp 98.3 F (36.8 C) (Oral)   Resp 16   Ht 6\' 1"  (1.854 m)   Wt 48.3 kg   SpO2 100%   BMI 14.05 kg/m  Pain Scale: PAINAD POSS *See Group Information*: 2-Acceptable,Slightly drowsy, easily aroused Pain Score: Asleep   SpO2: SpO2: 100 % O2 Device:SpO2: 100 % O2 Flow Rate: .O2 Flow Rate (L/min): 4 L/min  IO: Intake/output summary:  Intake/Output Summary (Last 24 hours) at 06/12/2023 1242 Last data filed at 06/12/2023 1040 Gross per 24 hour  Intake 1490.17 ml  Output 1450 ml  Net 40.17 ml    LBM:   Baseline Weight: Weight: 47.7 kg Most recent weight: Weight: 48.3 kg     Palliative Assessment/Data:     Time In: 0810 Time Out: 0925 Time Total: 75 minutes  Greater than 50%  of this time was spent counseling and coordinating care related to the above assessment and plan.  Signed by: Katheran Awe, NP   Please contact Palliative Medicine Team phone at [phone] for questions and concerns.  For individual provider: See Loretha Stapler

## 2023-06-12 NOTE — Progress Notes (Signed)
PROGRESS NOTE    [name]  BMW:[id number] DOB: 09/13/1961 DOA: 06/10/2023 PCP: Glendale Chard, DO   Brief Narrative:  [name] is a 62 y.o. male with medical history significant for ALS with semantic-variant frontotemporal dementia, and eosinophilic esophagitis who presented to the ED via EMS with reports of difficulty breathing and cough and is admitted for treatment of pneumonia complicated by acute hypoxic respiratory failure.    He was diagnosed with upper respiratory tract infection about a week ago and was placed on Levaquin and Augmentin 7/9. EMS reported O2 sats down to 81% on RA, and placed him on nonrebreather.   On presentation, pt was afebrile with RR 16-28 on 6 L, sats 100%. WBC 15.1k.  Lactic acid 1.7. VBG shows pH of 7.34, pCO2 59.  7/14 CXR clear.  UA wnl. CTA chest with bilateral lower lobe airspace consolidation, consistent with pneumonia. Opacification of the segmental branches of the left lower lobe bronchi, and partially lingular bronchi, may represent mucous plugging or aspiration. [name] was started on IV vancomycin and cefepime. 1 L bolus given. On 7/15 pt was seen by nutrition and speech path, recommended dysphagia 1 diet due to effects of ALS. Consulted palliative care as well. On 7/16, O2 requirement improved to RA, continued leukocytosis of 13.7k while on Cefepime, remains afebrile, with weak cough.    Assessment & Plan:   Principal Problem:   PNA (pneumonia) Active Problems:   Acute hypoxic respiratory failure (HCC)   Amyotrophic lateral sclerosis (ALS) (HCC)   Frontotemporal dementia (HCC)   Dysarthria   Severe sepsis (HCC)  Assessment and Plan: PNA (pneumonia), aspiration? Began tx of URI on 7/9 with Levaquin and Augmentin. Pt presented with report that cough symptoms began after eating breakfast. Hx of ALS and CT Chest evidence of b/l LL pna and opacification of segmental branches of LLL bronchi c/f aspiration vs mucous plug. Started on IV  Vancomycin and Cefepime on 7/14. WBCs increased mildly from 11.4k to 13.7k. Has now transitioned to RA with sats >96%, but still tachypneic with shallow breathing, increased when speaking, may be close to baseline with ALS. - Continue IV Vancomycin and Cefepime, initiated 7/14 - Dextromethorphan-guaifenesin BID to aid mucous clearing   Severe sepsis (HCC) As documented under pneumonia. No longer meeting criteria, but will continue to watch blood cultures, thus far NGTD at 2 days. - Follow-up blood cultures  Goals of care Pt with an overall poor prognosis with ALS. Reviewed his outpatient Neurology notes from 05/08/23 and 07/07/22, which described that [name] did not intend to pursue treatment of his ALS, declining Riluzole, Radicava and Relyvrio. He has lost significant weight and now weighs 47.7kg with low Body mass index is 14.05 kg/m. Had a brief goals of care conversation with [name] on 7/15, which was facilitated by one of his caregivers from [facility] (pt with significant dysarthria). [name] reaffirmed his full code status, and does not have a POA. With this current admission for a possible aspiration pneumonia, he does not demonstrate a strong cough that seems capable of clearing secretions. He has been seen by Speech path and is recommended for Dysphagia 1 diet. Seen by dietician as well who highlighted two treatment paths depending on patient's overall goals of care. This morning, pt seemed to want to apologize if he had ever hurt anyone, though ultimately uncertain if this is what he meant to say. Attempted clarification multiple times- may reflect intuitive sense of  approaching end of life? Would benefit from [facility] caregivers being present in further conversations, as they seem familiar with his understanding his speech, and he seems more comfortable communicating when they are present to hear him.  - Consulted Palliative Care - Consulted Chaplain  service  Amyotrophic lateral sclerosis (ALS) [facility]) Follows with neurologist Dr. Allena Katz. Per last note 05/08/2023, patient has ALS and semantic-variant frontotemporal dementia, manifesting with progressive bibrachial plegia, head drop and cognitive changes.  He is a [facility] resident and is well known to their staff, who kindly assisted our team in communicating with [name]. - Dysphagia 1 diet - Speech path and dietician following - Modified Barium Swallow study today  Acute hypoxic respiratory failure (HCC) Secondary to bacterial pneumonia. Initially on presentation, O2 sats dropped to 80s when transferring from nonrebreather to high flow.  This morning, 7/16 sats>95% on RA but charted at 5L, not sure when he was weaned but will continue to monitor. - Continue appropriate supplemental O2 via [place] as needed for sats >92%  Mild to moderate protein-calorie malnutrition Low Body mass index is 14.05 kg/m. Albumin is fair at 3.6. Creatinine is low, likely a marker of muscle wasting 2/2 ALS. - Dysphagia 1 diet with nutrition supplementation per dietician - Speech path and dietician following   DVT prophylaxis: Lovenox Code Status: Full code Family Communication: Friends listed as contacts, spoke with [facility]. Pt does not have a POA.   Consultants:  Palliative Care  Procedures:  None  Antimicrobials:  IV Vancomycin (7/14-7/15) and Cefepime initiated 7/14   Subjective: Patient seen and evaluated today with no new acute complaints or concerns. No acute concerns or events noted overnight. He has semantic-variant frontotemporal dementia (per Neurology's notes) which causes difficulty in understanding him. He seems to describe today that he is sorry if he ever hurt anyone. Tried multiple times to clarify, but ultimately unsure. Pt denied any other concerns.  Objective: Vitals:   06/12/23 0427 06/12/23 0500 06/12/23 0537 06/12/23 0745  BP: 99/68 (!) 141/88     Pulse: (!) 54 69    Resp: 20 19    Temp:   98.5 F (36.9 C) 97.8 F (36.6 C)  TempSrc:   Oral Axillary  SpO2: 100% 100%    Weight:   48.3 kg   Height:        Intake/Output Summary (Last 24 hours) at 06/12/2023 1028 Last data filed at 06/12/2023 0600 Gross per 24 hour  Intake 1490.17 ml  Output 1300 ml  Net 190.17 ml   Filed Weights   06/10/23 1851 06/11/23 0500 06/12/23 0537  Weight: 47.7 kg 47.7 kg 48.3 kg    Examination:  General exam: Appears frail, concerned, but comfortable   Respiratory system:  Weak cough. Tachypneic on RA. Cardiovascular system: S1 & S2 heard, RRR.  Gastrointestinal system: Abdomen is soft and non tender Central nervous system: Alert and awake Extremities: No edema, muscle wasting Skin: No significant lesions noted Psychiatry: Responsive affect.    Data Reviewed: I have personally reviewed following labs and imaging studies  CBC: Recent Labs  Lab 06/10/23 1307 06/10/23 1349 06/11/23 0423 06/12/23 0501  WBC  --  15.1* 11.4* 13.7*  NEUTROABS  --  12.2*  --   --   HGB 16.3 15.2 15.5 14.0  HCT 48.0 46.8 47.5 42.5  MCV  --  95.5 94.8 93.8  PLT  --  284 281 280   Basic Metabolic Panel: Recent Labs  Lab 06/10/23 1307 06/10/23 1349 06/11/23 0423 06/12/23 0501  NA 139 136 137 137  K 4.4 4.1 3.6 4.1  CL 104 102 104 103  CO2  --  25 24 27   GLUCOSE 104* 107* 112* 114*  BUN 14 14 10  7*  CREATININE 0.60* 0.58* 0.50* 0.49*  CALCIUM  --  8.8* 8.6* 8.6*  MG  --   --   --  2.1   GFR: Estimated Creatinine Clearance: 63.7 mL/min (A) (by C-G formula based on SCr of 0.49 mg/dL (L)). Liver Function Tests: Recent Labs  Lab 06/10/23 1349  AST 47*  ALT 48*  ALKPHOS 70  BILITOT 0.5  PROT 7.2  ALBUMIN 3.6   No results for input(s): "LIPASE", "AMYLASE" in the last 168 hours. No results for input(s): "AMMONIA" in the last 168 hours. Coagulation Profile: Recent Labs  Lab 06/10/23 1349  INR 1.0   Cardiac Enzymes: No results for  input(s): "CKTOTAL", "CKMB", "CKMBINDEX", "TROPONINI" in the last 168 hours. BNP (last 3 results) No results for input(s): "PROBNP" in the last 8760 hours. HbA1C: No results for input(s): "HGBA1C" in the last 72 hours. CBG: No results for input(s): "GLUCAP" in the last 168 hours. Lipid Profile: No results for input(s): "CHOL", "HDL", "LDLCALC", "TRIG", "CHOLHDL", "LDLDIRECT" in the last 72 hours. Thyroid Function Tests: No results for input(s): "TSH", "T4TOTAL", "FREET4", "T3FREE", "THYROIDAB" in the last 72 hours. Anemia Panel: No results for input(s): "VITAMINB12", "FOLATE", "FERRITIN", "TIBC", "IRON", "RETICCTPCT" in the last 72 hours. Sepsis Labs: Recent Labs  Lab 06/10/23 1349 06/10/23 1732  LATICACIDVEN 1.7 1.1    Recent Results (from the past 240 hour(s))  Blood Culture (routine x 2)     Status: None (Preliminary result)   Collection Time: 06/10/23  1:49 PM   Specimen: BLOOD LEFT FOREARM  Result Value Ref Range Status   Specimen Description   Final    BLOOD LEFT FOREARM BOTTLES DRAWN AEROBIC AND ANAEROBIC   Special Requests Blood Culture adequate volume  Final   Culture   Final    NO GROWTH 2 DAYS Performed at [facility], [address] [place], [place] [address]    Report Status PENDING  Incomplete  Blood Culture (routine x 2)     Status: None (Preliminary result)   Collection Time: 06/10/23  2:24 PM   Specimen: BLOOD RIGHT FOREARM  Result Value Ref Range Status   Specimen Description   Final    BLOOD RIGHT FOREARM BOTTLES DRAWN AEROBIC AND ANAEROBIC   Special Requests Blood Culture adequate volume  Final   Culture   Final    NO GROWTH 2 DAYS Performed at [facility], [address], [place], [place] [address]    Report Status PENDING  Incomplete  SARS Coronavirus 2 by RT PCR (hospital order, performed in [facility] hospital lab) *cepheid single result test* Anterior Nasal Swab     Status: None   Collection Time: 06/10/23  6:45 PM   Specimen: Anterior Nasal  Swab  Result Value Ref Range Status   SARS Coronavirus 2 by RT PCR NEGATIVE NEGATIVE Final    Comment: (NOTE) SARS-CoV-2 target nucleic acids are NOT DETECTED.  The SARS-CoV-2 RNA is generally detectable in upper and lower respiratory specimens during the acute phase of infection. The lowest concentration of SARS-CoV-2 viral copies this assay can detect is 250 copies / mL. A negative result does not preclude SARS-CoV-2 infection and should not be used as the sole basis for treatment or other patient management  decisions.  A negative result may occur with improper specimen collection / handling, submission of specimen other than nasopharyngeal swab, presence of viral mutation(s) within the areas targeted by this assay, and inadequate number of viral copies (<250 copies / mL). A negative result must be combined with clinical observations, patient history, and epidemiological information.  Fact Sheet for Patients:   RoadLapTop.co.za  Fact Sheet for Healthcare Providers: http://kim-miller.com/  This test is not yet approved or  cleared by the [place] FDA and has been authorized for detection and/or diagnosis of SARS-CoV-2 by FDA under an Emergency Use Authorization (EUA).  This EUA will remain in effect (meaning this test can be used) for the duration of the COVID-19 declaration under Section 564(b)(1) of the Act, 21 U.S.C. section 360bbb-3(b)(1), unless the authorization is terminated or revoked sooner.  Performed at [facility], [address], [place], [place] [address]   MRSA Next Gen by PCR, Nasal     Status: None   Collection Time: 06/10/23  6:52 PM   Specimen: Nasal Mucosa; Nasal Swab  Result Value Ref Range Status   MRSA by PCR Next Gen NOT DETECTED NOT DETECTED Final    Comment: (NOTE) The GeneXpert MRSA Assay (FDA approved for NASAL specimens only), is one component of a comprehensive MRSA colonization  surveillance program. It is not intended to diagnose MRSA infection nor to guide or monitor treatment for MRSA infections. Test performance is not FDA approved in patients less than 4 years old. Performed at [facility], [address], [place], [place] [address]          Radiology Studies: CT Angio Chest PE W/Cm &/Or Wo Cm  Result Date: 06/10/2023 CLINICAL DATA:  Pulmonary embolism suspected. EXAM: CT ANGIOGRAPHY CHEST WITH CONTRAST TECHNIQUE: Multidetector CT imaging of the chest was performed using the standard protocol during bolus administration of intravenous contrast. Multiplanar CT image reconstructions and MIPs were obtained to evaluate the vascular anatomy. RADIATION DOSE REDUCTION: This exam was performed according to the departmental dose-optimization program which includes automated exposure control, adjustment of the mA and/or kV according to patient size and/or use of iterative reconstruction technique. CONTRAST:  75mL OMNIPAQUE IOHEXOL 350 MG/ML SOLN COMPARISON:  July 24, 2015 FINDINGS: Cardiovascular: Satisfactory opacification of the pulmonary arteries to the segmental level. No evidence of pulmonary embolism. Normal heart size. No pericardial effusion. Calcific atherosclerotic disease of the coronary arteries. Mediastinum/Nodes: No enlarged mediastinal, hilar, or axillary lymph nodes. Thyroid gland, trachea, and esophagus demonstrate no significant findings. Lungs/Pleura: Bilateral lower lobe airspace consolidation. Opacification of the segmental branches of the left lower lobe bronchi, and partially lingular bronchi. Upper Abdomen: No acute abnormality. Musculoskeletal: No chest wall abnormality. No acute or significant osseous findings. Review of the MIP images confirms the above findings. IMPRESSION: 1. No evidence of pulmonary embolus. 2. Bilateral lower lobe airspace consolidation, consistent with pneumonia. 3. Opacification of the segmental branches of the left lower lobe  bronchi, and partially lingular bronchi, may represent mucous plugging or aspiration. Endobronchial solid lesions are considered less likely, but possible. 4. Calcific atherosclerotic disease of the coronary arteries. 5. Aortic atherosclerosis. Aortic Atherosclerosis (ICD10-I70.0). Electronically Signed   By: Ted Mcalpine M.D.   On: 06/10/2023 16:20   DG Chest Port 1 View  Result Date: 06/10/2023 CLINICAL DATA:  Shortness of breath. EXAM: PORTABLE CHEST 1 VIEW COMPARISON:  Chest x-ray dated July 23, 2015. FINDINGS: The heart size and mediastinal contours are within normal limits. Normal pulmonary vascularity. No focal consolidation, pleural effusion,  or pneumothorax. No acute osseous abnormality. Old healed fracture deformity of the proximal right clavicle. IMPRESSION: No active disease. Electronically Signed   By: Obie Dredge M.D.   On: 06/10/2023 12:14        Scheduled Meds:  Chlorhexidine Gluconate Cloth  6 each Topical Daily   dextromethorphan-guaiFENesin  1 tablet Oral BID   enoxaparin (LOVENOX) injection  40 mg Subcutaneous Q24H   Continuous Infusions:  ceFEPime (MAXIPIME) IV 2 g (06/12/23 0451)   dextrose 5 % and 0.9 % NaCl 50 mL/hr at 06/11/23 1838     LOS: 2 days    Time spent: 35 minutes  Signed,  Marcelline Mates, MS4 Working with Dr. Maurilio Lovely

## 2023-06-13 ENCOUNTER — Other Ambulatory Visit: Payer: Self-pay

## 2023-06-13 DIAGNOSIS — R471 Dysarthria and anarthria: Secondary | ICD-10-CM | POA: Diagnosis not present

## 2023-06-13 DIAGNOSIS — G1221 Amyotrophic lateral sclerosis: Secondary | ICD-10-CM | POA: Diagnosis not present

## 2023-06-13 DIAGNOSIS — J9601 Acute respiratory failure with hypoxia: Secondary | ICD-10-CM | POA: Diagnosis not present

## 2023-06-13 DIAGNOSIS — Z7189 Other specified counseling: Secondary | ICD-10-CM | POA: Diagnosis not present

## 2023-06-13 DIAGNOSIS — J189 Pneumonia, unspecified organism: Secondary | ICD-10-CM | POA: Diagnosis not present

## 2023-06-13 DIAGNOSIS — Z515 Encounter for palliative care: Secondary | ICD-10-CM | POA: Diagnosis not present

## 2023-06-13 LAB — CBC
HCT: 41.9 % (ref 39.0–52.0)
Hemoglobin: 13.6 g/dL (ref 13.0–17.0)
MCH: 30.6 pg (ref 26.0–34.0)
MCHC: 32.5 g/dL (ref 30.0–36.0)
MCV: 94.4 fL (ref 80.0–100.0)
Platelets: 284 10*3/uL (ref 150–400)
RBC: 4.44 MIL/uL (ref 4.22–5.81)
RDW: 12.3 % (ref 11.5–15.5)
WBC: 15.4 10*3/uL — ABNORMAL HIGH (ref 4.0–10.5)
nRBC: 0 % (ref 0.0–0.2)

## 2023-06-13 LAB — BASIC METABOLIC PANEL
Anion gap: 4 — ABNORMAL LOW (ref 5–15)
BUN: 6 mg/dL — ABNORMAL LOW (ref 8–23)
CO2: 29 mmol/L (ref 22–32)
Calcium: 8.6 mg/dL — ABNORMAL LOW (ref 8.9–10.3)
Chloride: 105 mmol/L (ref 98–111)
Creatinine, Ser: 0.5 mg/dL — ABNORMAL LOW (ref 0.61–1.24)
GFR, Estimated: >60 mL/min (ref >60)
Glucose, Bld: 101 mg/dL — ABNORMAL HIGH (ref 70–99)
Potassium: 4.1 mmol/L (ref 3.5–5.1)
Sodium: 138 mmol/L (ref 135–145)

## 2023-06-13 LAB — MAGNESIUM: Magnesium: 2.3 mg/dL (ref 1.7–2.4)

## 2023-06-13 MED ORDER — FENTANYL CITRATE PF 50 MCG/ML IJ SOSY
12.5000 ug | PREFILLED_SYRINGE | INTRAMUSCULAR | Status: DC | PRN
  Administered 2023-06-13: 12.5 ug via INTRAVENOUS
  Filled 2023-06-13: qty 1

## 2023-06-13 MED ORDER — HYDROCODONE-ACETAMINOPHEN 5-325 MG PO TABS
1.0000 | ORAL_TABLET | Freq: Four times a day (QID) | ORAL | Status: DC | PRN
  Administered 2023-06-14: 1 via ORAL
  Filled 2023-06-13: qty 1

## 2023-06-13 NOTE — [place] FL2 (Signed)
[place] MEDICAID FL2 LEVEL OF CARE FORM     IDENTIFICATION  Patient Name: [name] Birthdate: 1960/12/30 Sex: male Admission Date (Current Location): 06/10/2023  [place] and [place] Number:  Aaron Edelman [id number] Q Facility and Address:  [facility],  [address], [place] [address]      Provider Number: [id number]  Attending Physician Name and Address:  Cleora Fleet, MD  Relative Name and Phone Number:  Wendee Copp [phone]    Current Level of Care: Hospital Recommended Level of Care: Skilled Nursing Facility Prior Approval Number:    Date Approved/Denied:   PASRR Number: [id number]  Discharge Plan: SNF    Current Diagnoses: Patient Active Problem List   Diagnosis Date Noted   PNA (pneumonia) 06/10/2023   Acute hypoxic respiratory failure (HCC) 06/10/2023   Amyotrophic lateral sclerosis (ALS) (HCC) 06/10/2023   Frontotemporal dementia (HCC) 06/10/2023   Dysarthria 06/10/2023   Severe sepsis (HCC) 06/10/2023   Proximal weakness of limb 01/21/2022   GERD (gastroesophageal reflux disease) 04/10/2013   Eosinophilic esophagitis 04/10/2013   Esophageal dysphagia 04/10/2013    Orientation RESPIRATION BLADDER Height & Weight     Self, Place  O2 (4L/M) External catheter Weight: 106 lb 7.7 oz (48.3 kg) Height:  6\' 1"  (185.4 cm)  BEHAVIORAL SYMPTOMS/MOOD NEUROLOGICAL BOWEL NUTRITION STATUS      Continent Diet  AMBULATORY STATUS COMMUNICATION OF NEEDS Skin   Limited Assist Verbally Skin abrasions                       Personal Care Assistance Level of Assistance  Bathing, Feeding, Dressing Bathing Assistance: Limited assistance Feeding assistance: Limited assistance Dressing Assistance: Limited assistance     Functional Limitations Info  Sight, Hearing, Speech Sight Info: Adequate Hearing Info: Adequate Speech Info: Impaired (Slurred)    SPECIAL CARE FACTORS FREQUENCY  PT (By licensed PT), OT (By licensed OT)     PT  Frequency: 5 X Week OT Frequency: 5 X Week            Contractures Contractures Info: Not present    Additional Factors Info  Code Status Code Status Info: DNR Allergies Info: No Known Allergies           Current Medications (06/13/2023):  This is the current hospital active medication list Current Facility-Administered Medications  Medication Dose Route Frequency Provider Last Rate Last Admin   acetaminophen (TYLENOL) tablet 650 mg  650 mg Oral Q6H PRN Emokpae, Ejiroghene E, MD       Or   acetaminophen (TYLENOL) suppository 650 mg  650 mg Rectal Q6H PRN Emokpae, Ejiroghene E, MD       ceFEPIme (MAXIPIME) 2 g in sodium chloride 0.9 % 100 mL IVPB  2 g Intravenous Q8H Emokpae, Ejiroghene E, MD 200 mL/hr at 06/13/23 1222 2 g at 06/13/23 1222   Chlorhexidine Gluconate Cloth 2 % PADS 6 each  6 each Topical Daily Emokpae, Ejiroghene E, MD   6 each at 06/13/23 1224   dextromethorphan-guaiFENesin (MUCINEX DM) 30-600 MG per 12 hr tablet 1 tablet  1 tablet Oral BID Sherryll Burger, Pratik D, DO   1 tablet at 06/13/23 1224   dextrose 5 %-0.9 % sodium chloride infusion   Intravenous Continuous Shah, Pratik D, DO 50 mL/hr at 06/12/23 1246 New Bag at 06/12/23 1246   enoxaparin (LOVENOX) injection 40 mg  40 mg Subcutaneous Q24H Emokpae, Ejiroghene E, MD   40 mg at 06/12/23 2014   fentaNYL (SUBLIMAZE) injection  12.5 mcg  12.5 mcg Intravenous Q2H PRN Laural Benes, Clanford L, MD   12.5 mcg at 06/13/23 1223   HYDROcodone-acetaminophen (NORCO/VICODIN) 5-325 MG per tablet 1-2 tablet  1-2 tablet Oral Q6H PRN Dove, Tasha A, NP       ondansetron (ZOFRAN) tablet 4 mg  4 mg Oral Q6H PRN Emokpae, Ejiroghene E, MD       Or   ondansetron (ZOFRAN) injection 4 mg  4 mg Intravenous Q6H PRN Emokpae, Ejiroghene E, MD       Oral care mouth rinse  15 mL Mouth Rinse PRN Adefeso, Oladapo, DO       polyethylene glycol (MIRALAX / GLYCOLAX) packet 17 g  17 g Oral Daily PRN Emokpae, Ejiroghene E, MD         Discharge  Medications: Please see discharge summary for a list of discharge medications.  Relevant Imaging Results:  Relevant Lab Results:   Additional Information [id number]  Catalina Gravel, LCSW

## 2023-06-13 NOTE — TOC Transition Note (Addendum)
Transition of Care [facility]) - CM/SW Discharge Note   Patient Details  Name: [name] MRN: [id number] Date of Birth: 04-[name]-1961  Transition of Care [facility]) CM/SW Contact:  Catalina Gravel, LCSW Phone Number: 06/13/2023, 2:15 PM   Clinical Narrative:    Pt from ALF, facility assessed today and determined, pt may need skill beyond their scope.  Pt agreeable to skilled/rehab placement.  CSW advised MD and worked pt up for SNF placement.  Waiting bed offers.  Pt has a close friend network who was in room visiting and said they are hoping location close so that they can visit once DC.  TOC will follow.   Addendum: CSW met with pt at bedside to present offer.  Pt said he wants to think about it.  Aware [facility] facility is one of the offers. CSW asked pt if he wanted me to call Weston Brass- the contact, about this, he stated no. He agreed he will think about tonight.  CSW sent message to [facility] as Devonne Doughty called- advised her Pt will think about offers and decide tomorrow. TOC to follow.   Barriers to Discharge: Continued Medical Work up   Patient Goals and CMS Choice      Discharge Placement                         Discharge Plan and Services Additional resources added to the After Visit Summary for                                       Social Determinants of Health (SDOH) Interventions SDOH Screenings   Food Insecurity: No Food Insecurity (06/10/2023)  Housing: Low Risk  (06/10/2023)  Transportation Needs: No Transportation Needs (06/10/2023)  Utilities: Not At Risk (06/10/2023)  Tobacco Use: Medium Risk (06/12/2023)     Readmission Risk Interventions    01/23/2022    2:22 PM  Readmission Risk Prevention Plan  Medication Screening Complete  Transportation Screening Complete

## 2023-06-13 NOTE — Care Management Important Message (Signed)
Important Message  Patient Details  Name: [name] MRN: [id number] Date of Birth: 1961-04-09   Medicare Important Message Given:  N/A - LOS <3 / Initial given by admissions     Corey Harold 06/13/2023, 11:31 AM

## 2023-06-13 NOTE — Plan of Care (Signed)

## 2023-06-13 NOTE — Progress Notes (Signed)
Palliative:  [name], [name], quietly in bed.  He appears acutely/chronically ill and very frail.  The workers from his ALF, [facility], are present along with 3 longtime friends, and bedside nursing staff.  Unfortunately, at this point [name] is requiring too much care to return to his ALF.  We talked about going to short-term rehab in an attempt to regain strength.  Friends at bedside encouraged [name] about going to short-term rehab and regaining strength.  I attempted to discuss with him the limitations of his body with his progressive ALS.  They do share his strong Christian faith.  They request that he have contemporary Christian music playing for him if possible.  We talk about his pneumonia and the treatment plan.  We talk about chest physiotherapy.  We also talk about speech therapy evaluation and recommendations.  They share that he has been complaining of pain but unable to tell them.  When asked he points to his bottom.  [place] added.   Conference with attending, bedside nursing staff, respiratory therapy, transition of care team related to patient condition, needs, goals of care, disposition.  Plan: At this point continue to treat the treatable but no CPR or intubation.  Short-term rehab.  Would benefit from outpatient palliative services  50 minutes Lillia Carmel, NP Palliative Medicine Team  Team Phone [phone] Greater then 50% of this time was spent counseling and coordinating care related to the above assessment and plan.

## 2023-06-13 NOTE — Progress Notes (Signed)
Patient alert with friends at bedside for majority of the day. He expressed being in pain earlier this morning and PRN pain meds were given. Patient is anxious about his condition and fears discharging to a new facility, comforted and support given to patient and friends.

## 2023-06-13 NOTE — Progress Notes (Signed)
PROGRESS NOTE    [name]  ZOX:[id number] DOB: [birth year] DOA: 06/10/2023 PCP: Glendale Chard, DO   Brief Narrative:  [name] is a 62 y.o. male with medical history significant for ALS with semantic-variant frontotemporal dementia, and eosinophilic esophagitis who presented to the ED via EMS with reports of difficulty breathing and cough and is admitted for treatment of pneumonia complicated by acute hypoxic respiratory failure.    He was diagnosed with upper respiratory tract infection about a week ago and was placed on Levaquin and Augmentin 7/9. EMS reported O2 sats down to 81% on RA, and placed him on nonrebreather.   On presentation, pt was afebrile with RR 16-28 on 6 L, sats 100%. WBC 15.1k.  Lactic acid 1.7. VBG shows pH of 7.34, pCO2 59.  7/14 CXR clear.  UA wnl. CTA chest with bilateral lower lobe airspace consolidation, consistent with pneumonia. Opacification of the segmental branches of the left lower lobe bronchi, and partially lingular bronchi, may represent mucous plugging or aspiration. [name] was started on IV vancomycin and cefepime. 1 L bolus given. On 7/15 pt was seen by nutrition and speech path, recommended dysphagia 1 diet due to effects of ALS. Consulted palliative care as well. On 7/16, O2 requirement improved to 2-3L, while on Cefepime, remained afebrile, with weak cough. He also had a MBSS which recommended dysphagia 3 diet. Because pt has a Body mass index is 14.05 kg/m., living with ALS (not pursuing treatment) with advanced symptoms, decided to pursue goals of care clarification. Palliative care was consulted as well, and pt's friend [name] of 40 years (no family or friends) elected DNR. Appears he will be focused on hospice care after discharge, awaiting placement at SNF. Continuing to monitor while treating likely aspiration pneumonia.    Assessment & Plan:   Principal Problem:   PNA (pneumonia) Active Problems:   Acute hypoxic respiratory failure  (HCC)   Amyotrophic lateral sclerosis (ALS) (HCC)   Frontotemporal dementia (HCC)   Dysarthria   Severe sepsis (HCC)  Assessment and Plan: PNA (pneumonia), aspiration? Began tx of URI on 7/9 with Levaquin and Augmentin. Pt presented with report that cough symptoms began after eating breakfast. Hx of ALS and CT Chest evidence of b/l LL pna and opacification of segmental branches of LLL bronchi c/f aspiration vs mucous plug. Started on IV Vancomycin and Cefepime on 7/14. WBCs have been slowly uptrending from 11.4k >13.7k, now 15.4k. Will check Procal and repeat CXR. - Procalcitonin - Repeat CXR - Continue IV Cefepime, initiated 7/14 - Dextromethorphan-guaifenesin BID to aid mucous clearing   Severe sepsis (HCC) As documented under pneumonia. No longer meeting criteria, but will continue to watch blood cultures, thus far NGTD at 3 days. - Follow-up blood cultures  Goals of care Pt with an overall poor prognosis with ALS. Reviewed his outpatient Neurology notes from 05/08/23 and 07/07/22, which described that [name] did not intend to pursue treatment of his ALS, declining Riluzole, Radicava and Relyvrio. He has lost significant weight and now weighs 47.7kg with low Body mass index is 14.05 kg/m. Had a brief goals of care conversation with [name] on 7/15, which was facilitated by one of his caregivers from [facility] (pt with significant dysarthria). [name] reaffirmed his full code status, and does not have a POA. With this current admission for a possible aspiration pneumonia, he does not demonstrate a strong cough that seems capable of clearing secretions. He has been seen  by Speech path and is recommended for Dysphagia 1 diet. Seen by dietician as well who highlighted two treatment paths depending on patient's overall goals of care. This morning, pt seemed to want to apologize if he had ever hurt anyone, though ultimately uncertain if this is what he meant to say.  Attempted clarification multiple times- may reflect intuitive sense of approaching end of life? Would benefit from [facility] caregivers being present in further conversations, as they seem familiar with his understanding his speech, and he seems more comfortable communicating when they are present to hear him.  - Consulted Palliative Care and appreciate recs and management - Consulted Chaplain service  Amyotrophic lateral sclerosis (ALS) (HCC) Follows with neurologist Dr. Allena Katz. Per last note 05/08/2023, patient has ALS and semantic-variant frontotemporal dementia, manifesting with progressive bibrachial plegia, head drop and cognitive changes.  He is a [facility] resident and is well known to their staff, who kindly assisted our team in communicating with [name]. Now s/p MBBS on 7/16 recommending dysphagia 3 diet - Dysphagia 3 diet - Speech path and dietician following  Acute hypoxic respiratory failure (HCC) Secondary to bacterial pneumonia. Initially on presentation, O2 sats dropped to 80s when transferring from nonrebreather to high flow.  Has been slowly weaning to 2-3L supplemental O2.  - Continue appropriate supplemental O2 via [place] as needed for sats >92%  Mild to moderate protein-calorie malnutrition Low Body mass index is 14.05 kg/m. Albumin is fair at 3.6. Creatinine is low, likely a marker of muscle wasting 2/2 ALS. - Dysphagia 1 diet with nutrition supplementation per dietician - Speech path and dietician following   DVT prophylaxis: Lovenox Code Status: Full code Family Communication: Friends listed as contacts, spoke with [facility].    Consultants:  Palliative Care  Procedures:  None  Antimicrobials:  IV Vancomycin (7/14-7/15) and Cefepime initiated 7/14   Subjective: Patient seen and evaluated today with no new acute complaints or concerns. No acute concerns or events noted overnight. He has semantic-variant frontotemporal dementia  (per Neurology's notes) which causes difficulty in understanding him. On examination [name] denied any concerns and was quiet today.   Objective: Vitals:   06/12/23 1400 06/12/23 1500 06/12/23 2011 06/13/23 0517  BP: 108/72 121/77 (!) 112/99 120/84  Pulse: (!) 57 60 76 60  Resp: 16 20 16 16   Temp:   98.4 F (36.9 C) 98.2 F (36.8 C)  TempSrc:   Oral Oral  SpO2: 100% 95% 100% 100%  Weight:      Height:        Intake/Output Summary (Last 24 hours) at 06/13/2023 1239 Last data filed at 06/13/2023 0816 Gross per 24 hour  Intake 560 ml  Output 950 ml  Net -390 ml   Filed Weights   06/10/23 1851 06/11/23 0500 06/12/23 0537  Weight: 47.7 kg 47.7 kg 48.3 kg    Examination:  General exam: Appears frail, comfortable   Respiratory system:  Weak cough. On 2L O2 [place]. Cardiovascular system: S1 & S2 heard, RRR.  Gastrointestinal system: Abdomen is soft and non tender Central nervous system: Alert and awake Extremities: No edema, muscle wasting Skin: No significant lesions noted Psychiatry: Responsive affect.    Data Reviewed: I have personally reviewed following labs and imaging studies  CBC: Recent Labs  Lab 06/10/23 1307 06/10/23 1349 06/11/23 0423 06/12/23 0501 06/13/23 0409  WBC  --  15.1* 11.4* 13.7* 15.4*  NEUTROABS  --  12.2*  --   --   --  HGB 16.3 15.2 15.5 14.0 13.6  HCT 48.0 46.8 47.5 42.5 41.9  MCV  --  95.5 94.8 93.8 94.4  PLT  --  284 281 280 284   Basic Metabolic Panel: Recent Labs  Lab 06/10/23 1307 06/10/23 1349 06/11/23 0423 06/12/23 0501 06/13/23 0409  NA 139 136 137 137 138  K 4.4 4.1 3.6 4.1 4.1  CL 104 102 104 103 105  CO2  --  25 24 27 29   GLUCOSE 104* 107* 112* 114* 101*  BUN 14 14 10  7* 6*  CREATININE 0.60* 0.58* 0.50* 0.49* 0.50*  CALCIUM  --  8.8* 8.6* 8.6* 8.6*  MG  --   --   --  2.1 2.3   GFR: Estimated Creatinine Bruce: 63.7 mL/min (A) (by C-G formula based on SCr of 0.5 mg/dL (L)). Liver Function Tests: Recent Labs   Lab 06/10/23 1349  AST 47*  ALT 48*  ALKPHOS 70  BILITOT 0.5  PROT 7.2  ALBUMIN 3.6   No results for input(s): "LIPASE", "AMYLASE" in the last 168 hours. No results for input(s): "AMMONIA" in the last 168 hours. Coagulation Profile: Recent Labs  Lab 06/10/23 1349  INR 1.0   Cardiac Enzymes: No results for input(s): "CKTOTAL", "CKMB", "CKMBINDEX", "TROPONINI" in the last 168 hours. BNP (last 3 results) No results for input(s): "PROBNP" in the last 8760 hours. HbA1C: No results for input(s): "HGBA1C" in the last 72 hours. CBG: No results for input(s): "GLUCAP" in the last 168 hours. Lipid Profile: No results for input(s): "CHOL", "HDL", "LDLCALC", "TRIG", "CHOLHDL", "LDLDIRECT" in the last 72 hours. Thyroid Function Tests: No results for input(s): "TSH", "T4TOTAL", "FREET4", "T3FREE", "THYROIDAB" in the last 72 hours. Anemia Panel: No results for input(s): "VITAMINB12", "FOLATE", "FERRITIN", "TIBC", "IRON", "RETICCTPCT" in the last 72 hours. Sepsis Labs: Recent Labs  Lab 06/10/23 1349 06/10/23 1732  LATICACIDVEN 1.7 1.1    Recent Results (from the past 240 hour(s))  Blood Culture (routine x 2)     Status: None (Preliminary result)   Collection Time: 06/10/23  1:49 PM   Specimen: BLOOD LEFT FOREARM  Result Value Ref Range Status   Specimen Description   Final    BLOOD LEFT FOREARM BOTTLES DRAWN AEROBIC AND ANAEROBIC   Special Requests Blood Culture adequate volume  Final   Culture   Final    NO GROWTH 3 DAYS Performed at [facility], [address], [place], [place] [address]    Report Status PENDING  Incomplete  Blood Culture (routine x 2)     Status: None (Preliminary result)   Collection Time: 06/10/23  2:24 PM   Specimen: BLOOD RIGHT FOREARM  Result Value Ref Range Status   Specimen Description   Final    BLOOD RIGHT FOREARM BOTTLES DRAWN AEROBIC AND ANAEROBIC   Special Requests Blood Culture adequate volume  Final   Culture   Final    NO GROWTH 3  DAYS Performed at [facility], [address], [place], [place] [address]    Report Status PENDING  Incomplete  SARS Coronavirus 2 by RT PCR (hospital order, performed in [facility] hospital lab) *cepheid single result test* Anterior Nasal Swab     Status: None   Collection Time: 06/10/23  6:45 PM   Specimen: Anterior Nasal Swab  Result Value Ref Range Status   SARS Coronavirus 2 by RT PCR NEGATIVE NEGATIVE Final    Comment: (NOTE) SARS-CoV-2 target nucleic acids are NOT DETECTED.  The SARS-CoV-2 RNA is generally detectable in  upper and lower respiratory specimens during the acute phase of infection. The lowest concentration of SARS-CoV-2 viral copies this assay can detect is 250 copies / mL. A negative result does not preclude SARS-CoV-2 infection and should not be used as the sole basis for treatment or other patient management decisions.  A negative result may occur with improper specimen collection / handling, submission of specimen other than nasopharyngeal swab, presence of viral mutation(s) within the areas targeted by this assay, and inadequate number of viral copies (<250 copies / mL). A negative result must be combined with clinical observations, patient history, and epidemiological information.  Fact Sheet for Patients:   RoadLapTop.co.za  Fact Sheet for Healthcare Providers: http://kim-miller.com/  This test is not yet approved or  cleared by the [place] FDA and has been authorized for detection and/or diagnosis of SARS-CoV-2 by FDA under an Emergency Use Authorization (EUA).  This EUA will remain in effect (meaning this test can be used) for the duration of the COVID-19 declaration under Section 564(b)(1) of the Act, 21 U.S.C. section 360bbb-3(b)(1), unless the authorization is terminated or revoked sooner.  Performed at [facility], [address] [place], [place] [address]   MRSA Next Gen by PCR, Nasal      Status: None   Collection Time: 06/10/23  6:52 PM   Specimen: Nasal Mucosa; Nasal Swab  Result Value Ref Range Status   MRSA by PCR Next Gen NOT DETECTED NOT DETECTED Final    Comment: (NOTE) The GeneXpert MRSA Assay (FDA approved for NASAL specimens only), is one component of a comprehensive MRSA colonization surveillance program. It is not intended to diagnose MRSA infection nor to guide or monitor treatment for MRSA infections. Test performance is not FDA approved in patients less than 64 years old. Performed at [facility], [address], [place], [place] [address]          Radiology Studies: DG Swallowing [facility] Pathology  Result Date: 06/12/2023 Table formatting from the original result was not included. Modified Barium Swallow Study Patient Details Name: [name] MRN: [id number] Date of Birth: 02/24/1961 Today's Date: 06/12/2023 HPI/PMH: HPI: SELWYN REASON is a 62 y.o. male with medical history significant for ALS with frontotemporal dementia, eosinophilic esophagitis.  Patient was brought to the ED via EMS with reports of difficulty breathing and cough.  At the time of my evaluation patient is awake alert, but he has baseline dysarthria, dementia, he attempts to answer simple questions but speech is hard to understand.  Per chart review,EDP, patient ate breakfast this morning like he normally does then started having difficulty breathing, also report that patient was generally weak and he has had a cough.  He was diagnosed with upper respiratory tract infection about a week ago on his med list at bedside from nursing home, he was placed on Levaquin and Augmentin 7/9. CTA chest with bilateral lower lobe airspace consolidation, consistent with pneumonia. Opacification of the segmental branches of the left lower lobe bronchi, and partially lingular bronchi, may represent mucous plugging or aspiration. [name] was started on IV vancomycin and cefepime. BSE completed yesterday and  MBSS recommended. Clinical Impression: Oropharyngeal swallow is essentially WNL with use of straw for administration of thins due to Pt unable to self feed. Pt was assessed with straw sips thin, puree, regular textures. Swallow trigger was generally at the level of the valleculae (puree and solids) and pyriforms (liquids) with min reduced tongue base retraction resulting in min vallecular, lateral channel, and  pyriform residue after the initial swallow, which reduced to none after spontaneous secondary swallow. No penetration/aspiration observed and no significant residuals post swallow. Esophageal sweep revealed stasis of barium and barium tablet in the distal esophagus with delayed emptying and some backflow/retrograde movement noted (but not up to the level of cervical esophagus). Pt was given a bite of puree, which cleared the barium tablet from distal esophagus. Recommend regular textures with chopped meats and 1:1 feeder assist and thin liquids via straw sips when Pt is sitting as upright as possible and remain upright for 30+ minutes after meals due to risk of post prandial aspiration (reflux risk). Pt will need to be monitored at his facility for any changes in swallow due to his deconditioned status in setting of ALS (difficulty generating a strong cough at times). Medications can be presented whole in puree or with water. Above to RN. Factors that may increase risk of adverse event in presence of aspiration [name] & [name] 2021): Factors that may increase risk of adverse event in presence of aspiration [name] & [name] 2021): Poor general health and/or compromised immunity; Reduced cognitive function; Limited mobility; Frail or deconditioned; Dependence for feeding and/or oral hygiene Recommendations/Plan: Swallowing Evaluation Recommendations Swallowing Evaluation Recommendations Recommendations: PO diet PO Diet Recommendation: Regular; Dysphagia 3 (Mechanical soft); Thin liquids (Level 0) Liquid  Administration via: Straw Medication Administration: Whole meds with liquid Supervision: Staff to assist with self-feeding; Full assist for feeding Swallowing strategies  : Slow rate; Small bites/sips; Multiple dry swallows after each bite/sip Postural changes: Position pt fully upright for meals; Stay upright 30-60 min after meals Oral care recommendations: Oral care BID (2x/Cooper); Staff/trained caregiver to provide oral care Treatment Plan Treatment Plan Treatment recommendations: No treatment recommended at this time Follow-up recommendations: No SLP follow up Functional status assessment: Patient has had a recent decline in their functional status and/or demonstrates limited ability to make significant improvements in function in a reasonable and predictable amount of time. Recommendations Recommendations for follow up therapy are one component of a multi-disciplinary discharge planning process, led by the attending physician.  Recommendations may be updated based on patient status, additional functional criteria and insurance authorization. Assessment: Orofacial Exam: Orofacial Exam Oral Cavity: Oral Hygiene: WFL Oral Cavity - Dentition: Poor condition (partials) Orofacial Anatomy: WFL Oral Motor/Sensory Function: Generalized oral weakness Anatomy: Anatomy: WFL Boluses Administered: Boluses Administered Boluses Administered: Thin liquids (Level 0); Puree; Solid  Oral Impairment Domain: Oral Impairment Domain Lip Closure: No labial escape Tongue control during bolus hold: Not tested Bolus preparation/mastication: Timely and efficient chewing and mashing Bolus transport/lingual motion: Brisk tongue motion Oral residue: Trace residue lining oral structures Location of oral residue : Tongue Initiation of pharyngeal swallow : Pyriform sinuses  Pharyngeal Impairment Domain: Pharyngeal Impairment Domain Soft palate elevation: No bolus between soft palate (SP)/pharyngeal wall (PW) Laryngeal elevation: Complete superior  movement of thyroid cartilage with complete approximation of arytenoids to epiglottic petiole Anterior hyoid excursion: Complete anterior movement Epiglottic movement: Complete inversion Laryngeal vestibule closure: Complete, no air/contrast in laryngeal vestibule Pharyngeal stripping wave : Present - complete Pharyngeal contraction (A/P view only): N/A Pharyngoesophageal segment opening: Complete distension and complete duration, no obstruction of flow Tongue base retraction: Trace column of contrast or air between tongue base and PPW Pharyngeal residue: Trace residue within or on pharyngeal structures Location of pharyngeal residue: Valleculae; Aryepiglottic folds  Esophageal Impairment Domain: Esophageal Impairment Domain Esophageal Bruce upright position: Esophageal retention with retrograde flow below pharyngoesophageal segment (PES) Pill: Pill Consistency administered: Thin  liquids (Level 0) Thin liquids (Level 0): [facility] Penetration/Aspiration Scale Score: Penetration/Aspiration Scale Score 1.  Material does not enter airway: Thin liquids (Level 0); Puree; Solid; Pill Compensatory Strategies: Compensatory Strategies Compensatory strategies: Yes Straw: Effective Effective Straw: Thin liquid (Level 0) Multiple swallows: Effective Effective Multiple Swallows: Thin liquid (Level 0)   General Information: Caregiver present: No  Diet Prior to this Study: Dysphagia 1 (pureed); Thin liquids (Level 0)   Temperature : Normal   Respiratory Status: WFL   Supplemental O2: None (Room air)   History of Recent Intubation: No  Behavior/Cognition: Cooperative; Alert; Pleasant mood Self-Feeding Abilities: Dependent for feeding Baseline vocal quality/speech: Normal Volitional Cough: Able to elicit Volitional Swallow: Able to elicit Exam Limitations: No limitations Goal Planning: Prognosis for improved oropharyngeal function: Fair No data recorded No data recorded Patient/Family Stated Goal: None stated No data recorded Pain:  Pain Assessment Pain Assessment: No/denies pain Breathing: 0 Negative Vocalization: 0 Facial Expression: 0 Body Language: 0 Consolability: 0 PAINAD Score: 0 End of Session: Start Time:SLP Start Time (ACUTE ONLY): 1145 Stop Time: SLP Stop Time (ACUTE ONLY): 1225 Time Calculation:SLP Time Calculation (min) (ACUTE ONLY): 40 min Charges: SLP Evaluations $ SLP Speech Visit: 1 Visit SLP Evaluations $BSS Swallow: 1 Procedure $MBS Swallow: 1 Procedure SLP visit diagnosis: SLP Visit Diagnosis: Dysphagia, oropharyngeal phase (R13.12) Past Medical History: Past Medical History: Diagnosis Date  ALS (amyotrophic lateral sclerosis) (HCC)   Clavicle fracture 07/27/2015  Eosinophilic esophagitis 11/27/2006  Previously treated with systemic corticosteroid therapy as the patient could not afford Flovent at that time. Patient was referred to the allergist but did not go to the appointment.  GERD (gastroesophageal reflux disease)   Seasonal allergies  Past Surgical History: Past Surgical History: Procedure Laterality Date  ESOPHAGOGASTRODUODENOSCOPY  01/31/2007  [id number] food impaction/ Schatzki's ring, inflamed distal esophageal mucosa  ESOPHAGOGASTRODUODENOSCOPY  02/14/2007  RMR: Somewhat ribbed appearing esophageal mucosa (biopsy-eosinophilic esophagitis), prominent Schatzki ring status post dilation with Maloney dilators.  Pale raised esophageal nodule status post biopsy (eosinophilic esophagitis). Moderate sized hiatal hernia.   ESOPHAGOGASTRODUODENOSCOPY (EGD) WITH ESOPHAGEAL DILATION N/A 04/25/2013  [id number] reflux esophagitis/eosinophilic esophagitis biopsy proven/Hiatal hernia/Antral erosions. conservative esophageal dilation Thank you, Havery Moros, CCC-SLP [phone] PORTER,DABNEY 06/12/2023, 5:02 PM       Scheduled Meds:  Chlorhexidine Gluconate Cloth  6 each Topical Daily   dextromethorphan-guaiFENesin  1 tablet Oral BID   enoxaparin (LOVENOX) injection  40 mg Subcutaneous Q24H   Continuous  Infusions:  ceFEPime (MAXIPIME) IV 2 g (06/13/23 1222)   dextrose 5 % and 0.9 % NaCl 50 mL/hr at 06/12/23 1246     LOS: 3 days    Time spent: 35 minutes  Signed,  Marcelline Mates, MS4 Working with Dr. Standley Dakins

## 2023-06-13 NOTE — Plan of Care (Signed)
  Problem: Education: Goal: Knowledge of General Education information will improve Description: Including pain rating scale, medication(s)/side effects and non-pharmacologic comfort measures Outcome: Not Met (add Reason)   Problem: Health Behavior/Discharge Planning: Goal: Ability to manage health-related needs will improve Outcome: Not Met (add Reason)   Problem: Clinical Measurements: Goal: Ability to maintain clinical measurements within normal limits will improve Outcome: Not Met (add Reason)   Problem: Clinical Measurements: Goal: Respiratory complications will improve Outcome: Not Met (add Reason)

## 2023-06-13 NOTE — Plan of Care (Signed)
  Problem: Acute Rehab PT Goals(only PT should resolve) Goal: Pt Will Go Supine/Side To Sit Outcome: Progressing Flowsheets (Taken 06/13/2023 1559) Pt will go Supine/Side to Sit: with minimal assist Goal: Patient Will Transfer Sit To/From Stand Outcome: Progressing Flowsheets (Taken 06/13/2023 1559) Patient will transfer sit to/from stand: with minimal assist Goal: Pt Will Transfer Bed To Chair/Chair To Bed Outcome: Progressing Flowsheets (Taken 06/13/2023 1559) Pt will Transfer Bed to Chair/Chair to Bed: with min assist Goal: Pt Will Ambulate Outcome: Progressing Flowsheets (Taken 06/13/2023 1559) Pt will Ambulate:  15 feet  with moderate assist   Kiffany Schelling SPT [employer], DPT Program

## 2023-06-13 NOTE — Evaluation (Signed)
Physical Therapy Evaluation Patient Details Name: [name] MRN: [id number] DOB: May 28, 1961 Today's Date: 06/13/2023  History of Present Illness  [name] is a 62 y.o. male with medical history significant for ALS with frontotemporal dementia, eosinophilic esophagitis.  Patient was brought to the ED via EMS with reports of difficulty breathing and cough.  At the time of my evaluation patient is awake alert, but he has baseline dysarthria, dementia, he attempts to answer simple questions but speech is hard to understand.  Per chart review,EDP, patient ate breakfast this morning like he normally does then started having difficulty breathing, also report that patient was generally weak and he has had a cough.  He was diagnosed with upper respiratory tract infection about a week ago on his med list at bedside from nursing home, he was placed on Levaquin and Augmentin 7/9. CTA chest with bilateral lower lobe airspace consolidation, consistent with pneumonia. Opacification of the segmental branches of the left lower lobe bronchi, and partially lingular bronchi, may represent mucous plugging or aspiration. [name] was started on IV vancomycin and cefepime. BSE completed yesterday and MBSS recommended.   Clinical Impression  Patients activity limited due to fatigue and muscle weakness. Able to ambulate forward and backward a couple steps with use of RW. Tolerated sitting up in chair at end of therapy.  Patient will benefit from continued skilled physical therapy in hospital and recommended venue below to increase strength, balance, endurance for safe ADLs and gait.       Assistance Recommended at Discharge Set up Supervision/Assistance  If plan is discharge home, recommend the following:  Can travel by private vehicle  A lot of help with walking and/or transfers;A lot of help with bathing/dressing/bathroom;Assistance with cooking/housework;Assist for transportation        Equipment  Recommendations Rolling walker (2 wheels)  Recommendations for Other Services       Functional Status Assessment       Precautions / Restrictions Precautions Precautions: Fall Restrictions Weight Bearing Restrictions: No      Mobility  Bed Mobility Overal bed mobility: Needs Assistance Bed Mobility: Supine to Sit     Supine to sit: Mod assist     General bed mobility comments: Pt able to sit upward with the help of bed rails and therapist    Transfers Overall transfer level: Needs assistance   Transfers: Sit to/from Stand, Bed to chair/wheelchair/BSC Sit to Stand: Mod assist           General transfer comment: RW    Ambulation/Gait Ambulation/Gait assistance: Min assist Gait Distance (Feet): 4 Feet Assistive device: Rolling walker (2 wheels) Gait Pattern/deviations: Step-to pattern, Decreased step length - right, Decreased step length - left, Decreased stride length       General Gait Details: Use of RW  Stairs            Wheelchair Mobility     Tilt Bed    Modified Rankin (Stroke Patients Only)       Balance Overall balance assessment: Needs assistance Sitting-balance support: Bilateral upper extremity supported, Feet supported Sitting balance-Leahy Scale: Poor Sitting balance - Comments: Required support to sit EOB, unable to hold head up Postural control: Other (comment) (Unable to hold head up) Standing balance support: Bilateral upper extremity supported Standing balance-Leahy Scale: Fair Standing balance comment: RW                             Pertinent  Vitals/Pain      Home Living Family/patient expects to be discharged to:: Assisted living                   Additional Comments: Pt poor historian and appears he was able to walk at assisted living facility    Prior Function               Mobility Comments: assisted by ALF staff       Hand Dominance        Extremity/Trunk Assessment                 Communication   Communication: Expressive difficulties  Cognition                                                General Comments      Exercises     Assessment/Plan    PT Assessment Patient needs continued PT services  PT Problem List Decreased strength;Decreased activity tolerance;Decreased balance;Decreased coordination       PT Treatment Interventions DME instruction;Therapeutic exercise;Gait training;Balance training;Therapeutic activities    PT Goals (Current goals can be found in the Care Plan section)  Acute Rehab PT Goals Patient Stated Goal: Return home PT Goal Formulation: With patient Time For Goal Achievement: 06/27/23 Potential to Achieve Goals: Fair    Frequency Min 3X/week     Co-evaluation               AM-PAC PT "6 Clicks" Mobility  Outcome Measure Help needed turning from your back to your side while in a flat bed without using bedrails?: A Little Help needed moving from lying on your back to sitting on the side of a flat bed without using bedrails?: A Little Help needed moving to and from a bed to a chair (including a wheelchair)?: A Lot Help needed standing up from a chair using your arms (e.g., wheelchair or bedside chair)?: A Lot Help needed to walk in hospital room?: A Lot Help needed climbing 3-5 steps with a railing? : Total 6 Click Score: 13    End of Session   Activity Tolerance: Patient limited by fatigue Patient left: in chair;with call bell/phone within reach Nurse Communication: Mobility status PT Visit Diagnosis: Unsteadiness on feet (R26.81);Other abnormalities of gait and mobility (R26.89);Muscle weakness (generalized) (M62.81)    Time: 1610-9604 PT Time Calculation (min) (ACUTE ONLY): 20 min   Charges:   PT Evaluation $PT Eval Moderate Complexity: 1 Mod PT Treatments $Therapeutic Activity: 8-22 mins PT General Charges $$ ACUTE PT VISIT: 1 Visit        Agness Sibrian  SP[facility] [place], DPT Program

## 2023-06-13 NOTE — Progress Notes (Signed)
Pharmacy Antibiotic Note  [name] is a 62 y.o. male admitted on 06/10/2023 with pneumonia.  Pharmacy has been consulted for cefepime dosing.   Patient currently afebrile, wbc elevated at 15 from 7/14. Cultures continue to be ngtd.   Plan: Cefepime 2 grams IV every 8 hours Monitor clinical progress, cultures/sensitivities, renal function, abx plan   Height: 6\' 1"  (185.4 cm) Weight: 48.3 kg (106 lb 7.7 oz) IBW/kg (Calculated) : 79.9  Temp (24hrs), Avg:98.3 F (36.8 C), Min:98.2 F (36.8 C), Max:98.4 F (36.9 C)  Recent Labs  Lab 06/10/23 1307 06/10/23 1349 06/10/23 1732 06/11/23 0423 06/12/23 0501 06/13/23 0409  WBC  --  15.1*  --  11.4* 13.7* 15.4*  CREATININE 0.60* 0.58*  --  0.50* 0.49* 0.50*  LATICACIDVEN  --  1.7 1.1  --   --   --     Estimated Creatinine Clearance: 63.7 mL/min (A) (by C-G formula based on SCr of 0.5 mg/dL (L)).    No Known Allergies  Antimicrobials this admission: 7/14 vancomycin  >> 7/15 7/14 cefepime >>     Microbiology results: 7/14 BCx: ngtd 7/14 MRSA PCR: neg   Thank you for allowing pharmacy to be a part of this patient's care.   Sheppard Coil PharmD., BCPS Clinical Pharmacist 06/13/2023 10:57 AM

## 2023-06-13 NOTE — Discharge Summary (Addendum)
Physician Discharge Summary  YU DEARMENT VWU:[id number] DOB: Apr 10, 1961 DOA: 06/10/2023  PCP: Glendale Chard, DO  Admit date: 06/10/2023  Discharge date: 06/14/2023  Ad[facility]  Disposition:  SNF  Recommendations for Outpatient Follow-up:  Follow up with PCP in 1-2 weeks Please obtain BMP/CBC in one week Please follow up on the following pending results: None at this time. Refer to updated medication list below, discharging with PO Augmentin BID for 5 days 7/19-7/23 Advise establishing care with palliative care outpatient  Speech Pathology saw this patient and recommends the following to reduce risk of aspiration: Recommendations: PO diet PO Diet Recommendation: Regular;Dysphagia 3 (Mechanical soft);Thin liquids (Level 0) Liquid Administration via: Straw Medication Administration: Whole meds with liquid Supervision: Staff to assist with self-feeding;Full assist for feeding Swallowing strategies  : Slow rate;Small bites/sips;Multiple dry swallows after each bite/sip Postural changes: Position pt fully upright for meals;Stay upright 30-60 min after meals Oral care recommendations: Oral care BID (2x/day);Staff/trained caregiver to provide oral care  Home Health: SNF Rehab  Equipment/Devices: None  Discharge Condition:Stable  CODE STATUS: New DNR, signed 7/17  Diet recommendation: Regular  Brief/Interim Summary: [name] is a 62 y.o. male with medical history significant for ALS with semantic-variant frontotemporal dementia, and eosinophilic esophagitis who presented to the ED via EMS with reports of difficulty breathing and cough and is admitted for treatment of pneumonia complicated by acute hypoxic respiratory failure.    He was diagnosed with upper respiratory tract infection about a week ago and was placed on Levaquin and Augmentin 7/9. EMS reported O2 sats down to 81% on RA, and placed him on nonrebreather.   On presentation, pt  was afebrile with RR 16-28 on 6 L, sats 100%. WBC 15.1k.  Lactic acid 1.7. VBG shows pH of 7.34, pCO2 59.  7/14 CXR clear.  UA wnl. CTA chest with bilateral lower lobe airspace consolidation, consistent with pneumonia. Opacification of the segmental branches of the left lower lobe bronchi, and partially lingular bronchi, may represent mucous plugging or aspiration. Mr Freire was started on IV vancomycin and cefepime. 1 L bolus given. On 7/15 pt was seen by nutrition and speech path, recommended dysphagia 1 diet due to effects of ALS. Consulted palliative care as well. On 7/16, O2 requirement improved to 2-3L, while on Cefepime, remained afebrile, with weak cough. He also had a MBSS which recommended dysphagia 3 diet. Because pt has a Body mass index is 14.05 kg/m., living with ALS (not pursuing treatment) with advanced symptoms, decided to pursue goals of care clarification. Palliative care was consulted as well, and pt's friend [name] of 40 years (no family or friends) elected DNR. Appears he will be focused on hospice care after discharge, and placement at SNF, after being staff from his ALF expressed concern in managing his needs at this time.   Discharge Diagnoses:  Principal Problem:   PNA (pneumonia) Active Problems:   Acute hypoxic respiratory failure (HCC)   Amyotrophic lateral sclerosis (ALS) (HCC)   Frontotemporal dementia (HCC)   Dysarthria   Severe sepsis (HCC)  Discharge Instructions   Allergies as of 06/14/2023   No Known Allergies      Medication List     STOP taking these medications    dorzolamide-timolol 2-0.5 % ophthalmic solution Commonly known as: COSOPT   ELDERBERRY PO       TAKE these medications    amoxicillin-clavulanate 875-125 MG tablet Commonly known as: AUGMENTIN Take 1 tablet by mouth 2 (  two) times daily for 5 days.   hydrOXYzine 10 MG tablet Commonly known as: ATARAX Take 1 tablet (10 mg total) by mouth every 6 (six) hours as needed for anxiety.    latanoprost 0.005 % ophthalmic solution Commonly known as: XALATAN Place 1 drop into the right eye at bedtime.   polyethylene glycol 17 g packet Commonly known as: MIRALAX / GLYCOLAX Take 17 g by mouth daily as needed for mild constipation.   predniSONE 5 MG tablet Commonly known as: DELTASONE Take 1 tablet (5 mg total) by mouth daily with breakfast.   vitamin C 1000 MG tablet Take 1,000 mg by mouth 4 (four) times daily. For 7 days   ZINC CHELATED PO Take 3 tablets by mouth 2 (two) times daily. For 7 days        Contact information for after-discharge care     Destination     [facility] .   Service: Skilled Nursing Contact information: [address] [place] [address] [phone]                    No Known Allergies  Consultations: Palliative Care   Procedures/Studies: DG Chest Port 1 View  Result Date: 06/14/2023 CLINICAL DATA:  [id number] PNA (pneumonia) [id number] EXAM: PORTABLE CHEST 1 VIEW COMPARISON:  06/10/2023. FINDINGS: There are asymmetric heterogeneous opacities overlying the left lower lung zone, better visualized on the recent chest CT scan, compatible with focal pneumonitis. Patchy opacities in the right lung base seen on the CT scan are not well appreciated on this exam. No other acute consolidation or major atelectasis. Bilateral lateral costophrenic angles are clear. Stable cardio-mediastinal silhouette. No acute osseous abnormalities. The soft tissues are within normal limits. IMPRESSION: 1. Left lower lung zone pneumonitis, better visualized on the recent chest CT scan. Electronically Signed   By: Jules Schick M.D.   On: 06/14/2023 08:19   DG Swallowing Func-Speech Pathology  Result Date: 06/12/2023 Table formatting from the original result was not included. Modified Barium Swallow Study Patient Details Name: [name] MRN: [id number] Date of Birth: Aug 08, 1961 Today's Date: 06/12/2023 HPI/PMH: HPI:  [name] is a 62 y.o. male with medical history significant for ALS with frontotemporal dementia, eosinophilic esophagitis.  Patient was brought to the ED via EMS with reports of difficulty breathing and cough.  At the time of my evaluation patient is awake alert, but he has baseline dysarthria, dementia, he attempts to answer simple questions but speech is hard to understand.  Per chart review,EDP, patient ate breakfast this morning like he normally does then started having difficulty breathing, also report that patient was generally weak and he has had a cough.  He was diagnosed with upper respiratory tract infection about a week ago on his med list at bedside from nursing home, he was placed on Levaquin and Augmentin 7/9. CTA chest with bilateral lower lobe airspace consolidation, consistent with pneumonia. Opacification of the segmental branches of the left lower lobe bronchi, and partially lingular bronchi, may represent mucous plugging or aspiration. Mr Kuramoto was started on IV vancomycin and cefepime. BSE completed yesterday and MBSS recommended. Clinical Impression: Oropharyngeal swallow is essentially WNL with use of straw for administration of thins due to Pt unable to self feed. Pt was assessed with straw sips thin, puree, regular textures. Swallow trigger was generally at the level of the valleculae (puree and solids) and pyriforms (liquids) with min reduced tongue base retraction resulting in min vallecular, lateral  channel, and pyriform residue after the initial swallow, which reduced to none after spontaneous secondary swallow. No penetration/aspiration observed and no significant residuals post swallow. Esophageal sweep revealed stasis of barium and barium tablet in the distal esophagus with delayed emptying and some backflow/retrograde movement noted (but not up to the level of cervical esophagus). Pt was given a bite of puree, which cleared the barium tablet from distal esophagus. Recommend  regular textures with chopped meats and 1:1 feeder assist and thin liquids via straw sips when Pt is sitting as upright as possible and remain upright for 30+ minutes after meals due to risk of post prandial aspiration (reflux risk). Pt will need to be monitored at his facility for any changes in swallow due to his deconditioned status in setting of ALS (difficulty generating a strong cough at times). Medications can be presented whole in puree or with water. Above to RN. Factors that may increase risk of adverse event in presence of aspiration Rubye Oaks & Clearance Coots 2021): Factors that may increase risk of adverse event in presence of aspiration Rubye Oaks & Clearance Coots 2021): Poor general health and/or compromised immunity; Reduced cognitive function; Limited mobility; Frail or deconditioned; Dependence for feeding and/or oral hygiene Recommendations/Plan: Swallowing Evaluation Recommendations Swallowing Evaluation Recommendations Recommendations: PO diet PO Diet Recommendation: Regular; Dysphagia 3 (Mechanical soft); Thin liquids (Level 0) Liquid Administration via: Straw Medication Administration: Whole meds with liquid Supervision: Staff to assist with self-feeding; Full assist for feeding Swallowing strategies  : Slow rate; Small bites/sips; Multiple dry swallows after each bite/sip Postural changes: Position pt fully upright for meals; Stay upright 30-60 min after meals Oral care recommendations: Oral care BID (2x/day); Staff/trained caregiver to provide oral care Treatment Plan Treatment Plan Treatment recommendations: No treatment recommended at this time Follow-up recommendations: No SLP follow up Functional status assessment: Patient has had a recent decline in their functional status and/or demonstrates limited ability to make significant improvements in function in a reasonable and predictable amount of time. Recommendations Recommendations for follow up therapy are one component of a multi-disciplinary discharge  planning process, led by the attending physician.  Recommendations may be updated based on patient status, additional functional criteria and insurance authorization. Assessment: Orofacial Exam: Orofacial Exam Oral Cavity: Oral Hygiene: WFL Oral Cavity - Dentition: Poor condition (partials) Orofacial Anatomy: WFL Oral Motor/Sensory Function: Generalized oral weakness Anatomy: Anatomy: WFL Boluses Administered: Boluses Administered Boluses Administered: Thin liquids (Level 0); Puree; Solid  Oral Impairment Domain: Oral Impairment Domain Lip Closure: No labial escape Tongue control during bolus hold: Not tested Bolus preparation/mastication: Timely and efficient chewing and mashing Bolus transport/lingual motion: Brisk tongue motion Oral residue: Trace residue lining oral structures Location of oral residue : Tongue Initiation of pharyngeal swallow : Pyriform sinuses  Pharyngeal Impairment Domain: Pharyngeal Impairment Domain Soft palate elevation: No bolus between soft palate (SP)/pharyngeal wall (PW) Laryngeal elevation: Complete superior movement of thyroid cartilage with complete approximation of arytenoids to epiglottic petiole Anterior hyoid excursion: Complete anterior movement Epiglottic movement: Complete inversion Laryngeal vestibule closure: Complete, no air/contrast in laryngeal vestibule Pharyngeal stripping wave : Present - complete Pharyngeal contraction (A/P view only): N/A Pharyngoesophageal segment opening: Complete distension and complete duration, no obstruction of flow Tongue base retraction: Trace column of contrast or air between tongue base and PPW Pharyngeal residue: Trace residue within or on pharyngeal structures Location of pharyngeal residue: Valleculae; Aryepiglottic folds  Esophageal Impairment Domain: Esophageal Impairment Domain Esophageal clearance upright position: Esophageal retention with retrograde flow below pharyngoesophageal segment (PES) Pill: Pill Consistency  administered:  Thin liquids (Level 0) Thin liquids (Level 0): [facility] Penetration/Aspiration Scale Score: Penetration/Aspiration Scale Score 1.  Material does not enter airway: Thin liquids (Level 0); Puree; Solid; Pill Compensatory Strategies: Compensatory Strategies Compensatory strategies: Yes Straw: Effective Effective Straw: Thin liquid (Level 0) Multiple swallows: Effective Effective Multiple Swallows: Thin liquid (Level 0)   General Information: Caregiver present: No  Diet Prior to this Study: Dysphagia 1 (pureed); Thin liquids (Level 0)   Temperature : Normal   Respiratory Status: WFL   Supplemental O2: None (Room air)   History of Recent Intubation: No  Behavior/Cognition: Cooperative; Alert; Pleasant mood Self-Feeding Abilities: Dependent for feeding Baseline vocal quality/speech: Normal Volitional Cough: Able to elicit Volitional Swallow: Able to elicit Exam Limitations: No limitations Goal Planning: Prognosis for improved oropharyngeal function: Fair No data recorded No data recorded Patient/Family Stated Goal: None stated No data recorded Pain: Pain Assessment Pain Assessment: No/denies pain Breathing: 0 Negative Vocalization: 0 Facial Expression: 0 Body Language: 0 Consolability: 0 PAINAD Score: 0 End of Session: Start Time:SLP Start Time (ACUTE ONLY): 1145 Stop Time: SLP Stop Time (ACUTE ONLY): 1225 Time Calculation:SLP Time Calculation (min) (ACUTE ONLY): 40 min Charges: SLP Evaluations $ SLP Speech Visit: 1 Visit SLP Evaluations $BSS Swallow: 1 Procedure $MBS Swallow: 1 Procedure SLP visit diagnosis: SLP Visit Diagnosis: Dysphagia, oropharyngeal phase (R13.12) Past Medical History: Past Medical History: Diagnosis Date  ALS (amyotrophic lateral sclerosis) (HCC)   Clavicle fracture 07/27/2015  Eosinophilic esophagitis 11/27/2006  Previously treated with systemic corticosteroid therapy as the patient could not afford Flovent at that time. Patient was referred to the allergist but did not go to the appointment.  GERD  (gastroesophageal reflux disease)   Seasonal allergies  Past Surgical History: Past Surgical History: Procedure Laterality Date  ESOPHAGOGASTRODUODENOSCOPY  01/31/2007  YTK:[id number] food impaction/ Schatzki's ring, inflamed distal esophageal mucosa  ESOPHAGOGASTRODUODENOSCOPY  02/14/2007  RMR: Somewhat ribbed appearing esophageal mucosa (biopsy-eosinophilic esophagitis), prominent Schatzki ring status post dilation with Maloney dilators.  Pale raised esophageal nodule status post biopsy (eosinophilic esophagitis). Moderate sized hiatal hernia.   ESOPHAGOGASTRODUODENOSCOPY (EGD) WITH ESOPHAGEAL DILATION N/A 04/25/2013  TDD:UKGURKY reflux esophagitis/eosinophilic esophagitis biopsy proven/Hiatal hernia/Antral erosions. conservative esophageal dilation Thank you, Havery Moros, CCC-SLP [phone] PORTER,DABNEY 06/12/2023, 5:02 PM  CT Angio Chest PE W/Cm &/Or Wo Cm  Result Date: 06/10/2023 CLINICAL DATA:  Pulmonary embolism suspected. EXAM: CT ANGIOGRAPHY CHEST WITH CONTRAST TECHNIQUE: Multidetector CT imaging of the chest was performed using the standard protocol during bolus administration of intravenous contrast. Multiplanar CT image reconstructions and MIPs were obtained to evaluate the vascular anatomy. RADIATION DOSE REDUCTION: This exam was performed according to the departmental dose-optimization program which includes automated exposure control, adjustment of the mA and/or kV according to patient size and/or use of iterative reconstruction technique. CONTRAST:  75mL OMNIPAQUE IOHEXOL 350 MG/ML SOLN COMPARISON:  July 24, 2015 FINDINGS: Cardiovascular: Satisfactory opacification of the pulmonary arteries to the segmental level. No evidence of pulmonary embolism. Normal heart size. No pericardial effusion. Calcific atherosclerotic disease of the coronary arteries. Mediastinum/Nodes: No enlarged mediastinal, hilar, or axillary lymph nodes. Thyroid gland, trachea, and esophagus demonstrate no significant  findings. Lungs/Pleura: Bilateral lower lobe airspace consolidation. Opacification of the segmental branches of the left lower lobe bronchi, and partially lingular bronchi. Upper Abdomen: No acute abnormality. Musculoskeletal: No chest wall abnormality. No acute or significant osseous findings. Review of the MIP images confirms the above findings. IMPRESSION: 1. No evidence of pulmonary embolus. 2. Bilateral lower lobe airspace consolidation, consistent with pneumonia. 3.  Opacification of the segmental branches of the left lower lobe bronchi, and partially lingular bronchi, may represent mucous plugging or aspiration. Endobronchial solid lesions are considered less likely, but possible. 4. Calcific atherosclerotic disease of the coronary arteries. 5. Aortic atherosclerosis. Aortic Atherosclerosis (ICD10-I70.0). Electronically Signed   By: Ted Mcalpine M.D.   On: 06/10/2023 16:20   DG Chest Port 1 View  Result Date: 06/10/2023 CLINICAL DATA:  Shortness of breath. EXAM: PORTABLE CHEST 1 VIEW COMPARISON:  Chest x-ray dated July 23, 2015. FINDINGS: The heart size and mediastinal contours are within normal limits. Normal pulmonary vascularity. No focal consolidation, pleural effusion, or pneumothorax. No acute osseous abnormality. Old healed fracture deformity of the proximal right clavicle. IMPRESSION: No active disease. Electronically Signed   By: Obie Dredge M.D.   On: 06/10/2023 12:14    Discharge Exam: Vitals:   06/13/23 2023 06/14/23 0341  BP: 114/84 109/81  Pulse: 75 64  Resp: 16 17  Temp: 98.2 F (36.8 C) 97.8 F (36.6 C)  SpO2: 100% 98%   Vitals:   06/13/23 0517 06/13/23 1252 06/13/23 2023 06/14/23 0341  BP: 120/84 110/85 114/84 109/81  Pulse: 60 71 75 64  Resp: 16  16 17   Temp: 98.2 F (36.8 C) 98.4 F (36.9 C) 98.2 F (36.8 C) 97.8 F (36.6 C)  TempSrc: Oral Oral Oral Oral  SpO2: 100% 96% 100% 98%  Weight:      Height:        General: Pt is frail appearing, alert,  awake, not in acute distress Cardiovascular: RRR, S1/S2 +, no rubs, no gallops Respiratory: CTA bilaterally, no wheezing, no rhonchi, shallow breaths, weak cough Abdominal: Soft, NT, ND, bowel sounds + Extremities: no edema, no cyanosis  The results of significant diagnostics from this hospitalization (including imaging, microbiology, ancillary and laboratory) are listed below for reference.     Microbiology: Recent Results (from the past 240 hour(s))  Blood Culture (routine x 2)     Status: None (Preliminary result)   Collection Time: 06/10/23  1:49 PM   Specimen: BLOOD LEFT FOREARM  Result Value Ref Range Status   Specimen Description   Final    BLOOD LEFT FOREARM BOTTLES DRAWN AEROBIC AND ANAEROBIC   Special Requests Blood Culture adequate volume  Final   Culture   Final    NO GROWTH 4 DAYS Performed at [facility], [address] [place], [place] [address]    Report Status PENDING  Incomplete  Blood Culture (routine x 2)     Status: None (Preliminary result)   Collection Time: 06/10/23  2:24 PM   Specimen: BLOOD RIGHT FOREARM  Result Value Ref Range Status   Specimen Description   Final    BLOOD RIGHT FOREARM BOTTLES DRAWN AEROBIC AND ANAEROBIC   Special Requests Blood Culture adequate volume  Final   Culture   Final    NO GROWTH 4 DAYS Performed at [facility], [address], [place], [place] [address]    Report Status PENDING  Incomplete  SARS Coronavirus 2 by RT PCR (hospital order, performed in [facility] hospital lab) *cepheid single result test* Anterior Nasal Swab     Status: None   Collection Time: 06/10/23  6:45 PM   Specimen: Anterior Nasal Swab  Result Value Ref Range Status   SARS Coronavirus 2 by RT PCR NEGATIVE NEGATIVE Final    Comment: (NOTE) SARS-CoV-2 target nucleic acids are NOT DETECTED.  The SARS-CoV-2 RNA is generally detectable in upper and lower respiratory  specimens during the acute phase of infection. The lowest concentration of SARS-CoV-2  viral copies this assay can detect is 250 copies / mL. A negative result does not preclude SARS-CoV-2 infection and should not be used as the sole basis for treatment or other patient management decisions.  A negative result may occur with improper specimen collection / handling, submission of specimen other than nasopharyngeal swab, presence of viral mutation(s) within the areas targeted by this assay, and inadequate number of viral copies (<250 copies / mL). A negative result must be combined with clinical observations, patient history, and epidemiological information.  Fact Sheet for Patients:   RoadLapTop.co.za  Fact Sheet for Healthcare Providers: http://kim-miller.com/  This test is not yet approved or  cleared by the [place] FDA and has been authorized for detection and/or diagnosis of SARS-CoV-2 by FDA under an Emergency Use Authorization (EUA).  This EUA will remain in effect (meaning this test can be used) for the duration of the COVID-19 declaration under Section 564(b)(1) of the Act, 21 U.S.C. section 360bbb-3(b)(1), unless the authorization is terminated or revoked sooner.  Performed at [facility], [address] [place], [place] [address]   MRSA Next Gen by PCR, Nasal     Status: None   Collection Time: 06/10/23  6:52 PM   Specimen: Nasal Mucosa; Nasal Swab  Result Value Ref Range Status   MRSA by PCR Next Gen NOT DETECTED NOT DETECTED Final    Comment: (NOTE) The GeneXpert MRSA Assay (FDA approved for NASAL specimens only), is one component of a comprehensive MRSA colonization surveillance program. It is not intended to diagnose MRSA infection nor to guide or monitor treatment for MRSA infections. Test performance is not FDA approved in patients less than 55 years old. Performed at [facility], [address], [place], [place] [address]      Labs: BNP (last 3 results) Recent Labs    06/10/23 1349  BNP  49.0   Basic Metabolic Panel: Recent Labs  Lab 06/10/23 1349 06/11/23 0423 06/12/23 0501 06/13/23 0409 06/14/23 0358  NA 136 137 137 138 136  K 4.1 3.6 4.1 4.1 3.3*  CL 102 104 103 105 101  CO2 25 24 27 29 28   GLUCOSE 107* 112* 114* 101* 94  BUN 14 10 7* 6* 8  CREATININE 0.58* 0.50* 0.49* 0.50* 0.45*  CALCIUM 8.8* 8.6* 8.6* 8.6* 8.3*  MG  --   --  2.1 2.3 2.2   Liver Function Tests: Recent Labs  Lab 06/10/23 1349  AST 47*  ALT 48*  ALKPHOS 70  BILITOT 0.5  PROT 7.2  ALBUMIN 3.6   No results for input(s): "LIPASE", "AMYLASE" in the last 168 hours. No results for input(s): "AMMONIA" in the last 168 hours. CBC: Recent Labs  Lab 06/10/23 1349 06/11/23 0423 06/12/23 0501 06/13/23 0409 06/14/23 0358  WBC 15.1* 11.4* 13.7* 15.4* 17.4*  NEUTROABS 12.2*  --   --   --  13.3*  HGB 15.2 15.5 14.0 13.6 13.9  HCT 46.8 47.5 42.5 41.9 43.4  MCV 95.5 94.8 93.8 94.4 95.6  PLT 284 281 280 284 286   Cardiac Enzymes: No results for input(s): "CKTOTAL", "CKMB", "CKMBINDEX", "TROPONINI" in the last 168 hours. BNP: Invalid input(s): "POCBNP" CBG: No results for input(s): "GLUCAP" in the last 168 hours. D-Dimer No results for input(s): "DDIMER" in the last 72 hours. Hgb A1c No results for input(s): "HGBA1C" in the last 72 hours. Lipid Profile No results for input(s): "CHOL", "HDL", "LDLCALC", "TRIG", "  CHOLHDL", "LDLDIRECT" in the last 72 hours. Thyroid function studies No results for input(s): "TSH", "T4TOTAL", "T3FREE", "THYROIDAB" in the last 72 hours.  Invalid input(s): "FREET3" Anemia work up No results for input(s): "VITAMINB12", "FOLATE", "FERRITIN", "TIBC", "IRON", "RETICCTPCT" in the last 72 hours. Urinalysis    Component Value Date/Time   COLORURINE YELLOW 06/10/2023 1449   APPEARANCEUR CLEAR 06/10/2023 1449   LABSPEC 1.014 06/10/2023 1449   PHURINE 7.0 06/10/2023 1449   GLUCOSEU NEGATIVE 06/10/2023 1449   HGBUR NEGATIVE 06/10/2023 1449   BILIRUBINUR  NEGATIVE 06/10/2023 1449   KETONESUR NEGATIVE 06/10/2023 1449   PROTEINUR NEGATIVE 06/10/2023 1449   NITRITE NEGATIVE 06/10/2023 1449   LEUKOCYTESUR NEGATIVE 06/10/2023 1449   Sepsis Labs Recent Labs  Lab 06/11/23 0423 06/12/23 0501 06/13/23 0409 06/14/23 0358  WBC 11.4* 13.7* 15.4* 17.4*   Microbiology Recent Results (from the past 240 hour(s))  Blood Culture (routine x 2)     Status: None (Preliminary result)   Collection Time: 06/10/23  1:49 PM   Specimen: BLOOD LEFT FOREARM  Result Value Ref Range Status   Specimen Description   Final    BLOOD LEFT FOREARM BOTTLES DRAWN AEROBIC AND ANAEROBIC   Special Requests Blood Culture adequate volume  Final   Culture   Final    NO GROWTH 4 DAYS Performed at [facility], [address] [address] [place], [place] [address]    Report Status PENDING  Incomplete  Blood Culture (routine x 2)     Status: None (Preliminary result)   Collection Time: 06/10/23  2:24 PM   Specimen: BLOOD RIGHT FOREARM  Result Value Ref Range Status   Specimen Description   Final    BLOOD RIGHT FOREARM BOTTLES DRAWN AEROBIC AND ANAEROBIC   Special Requests Blood Culture adequate volume  Final   Culture   Final    NO GROWTH 4 DAYS Performed at [facility], [address], [place], [place] [address]    Report Status PENDING  Incomplete  SARS Coronavirus 2 by RT PCR (hospital order, performed in [facility] hospital lab) *cepheid single result test* Anterior Nasal Swab     Status: None   Collection Time: 06/10/23  6:45 PM   Specimen: Anterior Nasal Swab  Result Value Ref Range Status   SARS Coronavirus 2 by RT PCR NEGATIVE NEGATIVE Final    Comment: (NOTE) SARS-CoV-2 target nucleic acids are NOT DETECTED.  The SARS-CoV-2 RNA is generally detectable in upper and lower respiratory specimens during the acute phase of infection. The lowest concentration of SARS-CoV-2 viral copies this assay can detect is 250 copies / mL. A negative result does not preclude  SARS-CoV-2 infection and should not be used as the sole basis for treatment or other patient management decisions.  A negative result may occur with improper specimen collection / handling, submission of specimen other than nasopharyngeal swab, presence of viral mutation(s) within the areas targeted by this assay, and inadequate number of viral copies (<250 copies / mL). A negative result must be combined with clinical observations, patient history, and epidemiological information.  Fact Sheet for Patients:   RoadLapTop.co.za  Fact Sheet for Healthcare Providers: http://kim-miller.com/  This test is not yet approved or  cleared by the [place] FDA and has been authorized for detection and/or diagnosis of SARS-CoV-2 by FDA under an Emergency Use Authorization (EUA).  This EUA will remain in effect (meaning this test can be used) for the duration of the COVID-19 declaration under Section 564(b)(1) of the Act, 21 U.S.C.  section 360bbb-3(b)(1), unless the authorization is terminated or revoked sooner.  Performed at [facility], [address] [place], [place] [address]   MRSA Next Gen by PCR, Nasal     Status: None   Collection Time: 06/10/23  6:52 PM   Specimen: Nasal Mucosa; Nasal Swab  Result Value Ref Range Status   MRSA by PCR Next Gen NOT DETECTED NOT DETECTED Final    Comment: (NOTE) The GeneXpert MRSA Assay (FDA approved for NASAL specimens only), is one component of a comprehensive MRSA colonization surveillance program. It is not intended to diagnose MRSA infection nor to guide or monitor treatment for MRSA infections. Test performance is not FDA approved in patients less than 51 years old. Performed at [facility], [address], [place], [place] [address]    Time coordinating discharge: 35 minutes  SIGNED:  Marcelline Mates, MS4 Working with Dr. Standley Dakins  ATTENDING NOTE  Patient seen and examined with Desiree Lucy, Medical student. In addition to supervising the encounter, I played a key role in the decision making process as well as reviewed key findings.  Patient medically optimized and pneumonia appears to be treated.  Leukocytosis likely related to daily prednisone use which we will continue.  Procalcitonin is reassuring at less than 0.10.  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  He is medically stable to discharge to SNF today.  I agree with documentation as noted.  Rodney Langton, MD How to contact the [facility] Attending or Consulting provider 7A - 7P or covering provider during after hours 7P -7A, for this patient?  Check the care team in [facility] and look for a) attending/consulting TRH provider listed and b) the [facility] team listed Log into [email or web address] and use 's universal password to access. If you do not have the password, please contact the hospital operator. Locate the [facility] provider you are looking for under [facility] and page to a number that you can be directly reached. If you still have difficulty reaching the provider, please page the [facility] (Director on Call) for the Hospitalists listed on amion for assistance.

## 2023-06-14 ENCOUNTER — Inpatient Hospital Stay (HOSPITAL_COMMUNITY): Payer: Medicare Other

## 2023-06-14 DIAGNOSIS — Z7189 Other specified counseling: Secondary | ICD-10-CM | POA: Diagnosis not present

## 2023-06-14 DIAGNOSIS — Z515 Encounter for palliative care: Secondary | ICD-10-CM | POA: Diagnosis not present

## 2023-06-14 DIAGNOSIS — J189 Pneumonia, unspecified organism: Secondary | ICD-10-CM | POA: Diagnosis not present

## 2023-06-14 DIAGNOSIS — G1221 Amyotrophic lateral sclerosis: Secondary | ICD-10-CM | POA: Diagnosis not present

## 2023-06-14 LAB — BASIC METABOLIC PANEL
Anion gap: 7 (ref 5–15)
BUN: 8 mg/dL (ref 8–23)
CO2: 28 mmol/L (ref 22–32)
Calcium: 8.3 mg/dL — ABNORMAL LOW (ref 8.9–10.3)
Chloride: 101 mmol/L (ref 98–111)
Creatinine, Ser: 0.45 mg/dL — ABNORMAL LOW (ref 0.61–1.24)
GFR, Estimated: >60 mL/min (ref >60)
Glucose, Bld: 94 mg/dL (ref 70–99)
Potassium: 3.3 mmol/L — ABNORMAL LOW (ref 3.5–5.1)
Sodium: 136 mmol/L (ref 135–145)

## 2023-06-14 LAB — CBC
HCT: 43.4 % (ref 39.0–52.0)
Hemoglobin: 13.9 g/dL (ref 13.0–17.0)
MCH: 30.6 pg (ref 26.0–34.0)
MCHC: 32 g/dL (ref 30.0–36.0)
MCV: 95.6 fL (ref 80.0–100.0)
Platelets: 286 10*3/uL (ref 150–400)
RBC: 4.54 MIL/uL (ref 4.22–5.81)
RDW: 12.2 % (ref 11.5–15.5)
WBC: 17.4 10*3/uL — ABNORMAL HIGH (ref 4.0–10.5)
nRBC: 0 % (ref 0.0–0.2)

## 2023-06-14 LAB — PROCALCITONIN: Procalcitonin: <0.1 ng/mL

## 2023-06-14 LAB — DIFFERENTIAL
Abs Immature Granulocytes: 0.17 10*3/uL — ABNORMAL HIGH (ref 0.00–0.07)
Basophils Absolute: 0.1 10*3/uL (ref 0.0–0.1)
Basophils Relative: 0 %
Eosinophils Absolute: 0.2 10*3/uL (ref 0.0–0.5)
Eosinophils Relative: 1 %
Immature Granulocytes: 1 %
Lymphocytes Relative: 10 %
Lymphs Abs: 1.7 10*3/uL (ref 0.7–4.0)
Monocytes Absolute: 2 10*3/uL — ABNORMAL HIGH (ref 0.1–1.0)
Monocytes Relative: 11 %
Neutro Abs: 13.3 10*3/uL — ABNORMAL HIGH (ref 1.7–7.7)
Neutrophils Relative %: 77 %

## 2023-06-14 LAB — MAGNESIUM: Magnesium: 2.2 mg/dL (ref 1.7–2.4)

## 2023-06-14 MED ORDER — LORAZEPAM 2 MG/ML IJ SOLN: 0.5000 mg | Freq: Four times a day (QID) | INTRAMUSCULAR | Status: DC | PRN

## 2023-06-14 MED ORDER — ALPRAZOLAM 0.25 MG PO TABS: 0.2500 mg | ORAL_TABLET | Freq: Three times a day (TID) | ORAL | Status: DC | PRN

## 2023-06-14 MED ORDER — HYDROXYZINE HCL 10 MG PO TABS: 10.0000 mg | ORAL_TABLET | Freq: Four times a day (QID) | ORAL | Status: DC | PRN

## 2023-06-14 MED ORDER — HYDROXYZINE HCL 10 MG PO TABS: 10.0000 mg | ORAL_TABLET | Freq: Four times a day (QID) | ORAL | Status: AC | PRN

## 2023-06-14 MED ORDER — HYDROXYZINE HCL 10 MG/5ML PO SYRP: 25.0000 mg | ORAL_SOLUTION | ORAL | Status: DC | PRN

## 2023-06-14 MED ORDER — POLYETHYLENE GLYCOL 3350 17 G PO PACK: 17.0000 g | PACK | Freq: Every day | ORAL | Status: AC | PRN

## 2023-06-14 MED ORDER — POTASSIUM CHLORIDE 20 MEQ PO PACK
40.0000 meq | PACK | Freq: Once | ORAL | Status: AC
  Administered 2023-06-14: 40 meq via ORAL
  Filled 2023-06-14: qty 2

## 2023-06-14 MED ORDER — PREDNISONE 5 MG PO TABS: 5.0000 mg | ORAL_TABLET | Freq: Every day | ORAL | Status: AC

## 2023-06-14 MED ORDER — AMOXICILLIN-POT CLAVULANATE 875-125 MG PO TABS: 1.0000 | ORAL_TABLET | Freq: Two times a day (BID) | ORAL | Status: AC

## 2023-06-14 NOTE — Plan of Care (Signed)

## 2023-06-14 NOTE — Plan of Care (Signed)
  Problem: Education: Goal: Knowledge of General Education information will improve Description: Including pain rating scale, medication(s)/side effects and non-pharmacologic comfort measures Outcome: Progressing   Problem: Health Behavior/Discharge Planning: Goal: Ability to manage health-related needs will improve Outcome: Not Progressing   

## 2023-06-14 NOTE — Progress Notes (Signed)
Palliative: [name], [name], is lying quietly in bed.  He appears acutely/chronically ill and very frail.  He will briefly make, but not keep eye contact. He has known dementia, therefor I do not ask orientation questions. [name] is difficult to understand.  I do believe that he can make his basic needs known.  An acquaintance, [name] who is a long term friend of his Healthcare surrogate, [name].   We talk about disposition to short term rehab, [facility] rehab.  [name] shares his concerns about leaving his current facility.  Both [name] and I encouraged him that he will be well cared for and visited often at rehab.  She shares that she and [name] will bring his personal items so that he may be more comfortable.  We talk about [name] pneumonia and the treatment plan.  [name] asks about aspiration if this were as from food or mucus.  We talk about aspiration risks from food, liquid, even oral secretions.  We talk about ST eval and treatment plan including dietary recommendations.    Call to long time friend/default HCPOA, [name]. We talk about [name]'s acute and chronic health concerns.  We talk about aspiration pneumonia, chest PT, speech therapy, dietary recommendations and restrictions.  We talk about discharge to short-term rehab with the possible need for long-term care.  I again encouraged [name] to consider outpatient palliative services for further goals of care discussions.  Conference with attending, bedside nursing staff, transition of care team related to patient condition, needs, goals of care, disposition.  Plan:  Continue to treat the treatable, but no CPR or intubation.  Short term rehab at [facility] rehab, with possible need for LTC. Would benefit from out patient palliative services.    DNR/goldenrod form is on chart.     60 minutes  Lillia Carmel, NP Palliative Medicine Team  Team Phone [phone] Greater than 50% of this time was spent counseling and coordinating care related to the  above assessment and plan.

## 2023-06-15 LAB — CULTURE, BLOOD (ROUTINE X 2)
Culture: NO GROWTH
Culture: NO GROWTH
Special Requests: ADEQUATE
Special Requests: ADEQUATE

## 2023-06-18 ENCOUNTER — Ambulatory Visit: Payer: Medicare Other | Admitting: Neurology

## 2023-07-29 DEATH — deceased
